# Patient Record
Sex: Female | Born: 1945 | Race: White | Hispanic: No | Marital: Married | Smoking: Never smoker
Health system: Southern US, Community
[De-identification: ages and names within clinical notes are randomized; demographics above are authoritative.]

## PROBLEM LIST (undated history)

## (undated) DIAGNOSIS — E7800 Pure hypercholesterolemia, unspecified: Secondary | ICD-10-CM

## (undated) DIAGNOSIS — I10 Essential (primary) hypertension: Secondary | ICD-10-CM

## (undated) DIAGNOSIS — R112 Nausea with vomiting, unspecified: Secondary | ICD-10-CM

## (undated) DIAGNOSIS — Z9889 Other specified postprocedural states: Secondary | ICD-10-CM

## (undated) DIAGNOSIS — M75101 Unspecified rotator cuff tear or rupture of right shoulder, not specified as traumatic: Secondary | ICD-10-CM

## (undated) DIAGNOSIS — R51 Headache: Secondary | ICD-10-CM

## (undated) DIAGNOSIS — K219 Gastro-esophageal reflux disease without esophagitis: Secondary | ICD-10-CM

## (undated) DIAGNOSIS — N189 Chronic kidney disease, unspecified: Secondary | ICD-10-CM

## (undated) HISTORY — PX: OTHER SURGICAL HISTORY: SHX169

## (undated) HISTORY — PX: BREAST SURGERY: SHX581

## (undated) HISTORY — PX: SHOULDER SURGERY: SHX246

## (undated) HISTORY — DX: Essential (primary) hypertension: I10

## (undated) HISTORY — PX: TUBAL LIGATION: SHX77

## (undated) HISTORY — PX: FRACTURE SURGERY: SHX138

## (undated) HISTORY — PX: ABDOMINAL HYSTERECTOMY: SHX81

## (undated) HISTORY — PX: BREAST EXCISIONAL BIOPSY: SUR124

## (undated) HISTORY — PX: BLEPHAROPLASTY: SUR158

---

## 1999-06-04 ENCOUNTER — Ambulatory Visit (HOSPITAL_COMMUNITY): Admission: RE | Admit: 1999-06-04 | Discharge: 1999-06-04 | Payer: Self-pay | Admitting: Cardiology

## 1999-08-27 ENCOUNTER — Encounter: Payer: Self-pay | Admitting: Gynecology

## 1999-08-27 ENCOUNTER — Encounter: Admission: RE | Admit: 1999-08-27 | Discharge: 1999-08-27 | Payer: Self-pay | Admitting: Gynecology

## 1999-11-01 ENCOUNTER — Ambulatory Visit (HOSPITAL_COMMUNITY): Admission: RE | Admit: 1999-11-01 | Discharge: 1999-11-01 | Payer: Self-pay | Admitting: *Deleted

## 1999-11-01 ENCOUNTER — Encounter: Payer: Self-pay | Admitting: *Deleted

## 2000-03-02 ENCOUNTER — Emergency Department (HOSPITAL_COMMUNITY): Admission: EM | Admit: 2000-03-02 | Discharge: 2000-03-02 | Payer: Self-pay | Admitting: Emergency Medicine

## 2001-08-07 ENCOUNTER — Other Ambulatory Visit: Admission: RE | Admit: 2001-08-07 | Discharge: 2001-08-07 | Payer: Self-pay | Admitting: Gynecology

## 2002-09-03 ENCOUNTER — Other Ambulatory Visit: Admission: RE | Admit: 2002-09-03 | Discharge: 2002-09-03 | Payer: Self-pay | Admitting: Gynecology

## 2003-09-11 ENCOUNTER — Other Ambulatory Visit: Admission: RE | Admit: 2003-09-11 | Discharge: 2003-09-11 | Payer: Self-pay | Admitting: Gynecology

## 2003-10-09 ENCOUNTER — Encounter: Admission: RE | Admit: 2003-10-09 | Discharge: 2003-10-09 | Payer: Self-pay | Admitting: Gynecology

## 2004-10-09 ENCOUNTER — Encounter: Admission: RE | Admit: 2004-10-09 | Discharge: 2004-10-09 | Payer: Self-pay | Admitting: Gynecology

## 2004-11-02 ENCOUNTER — Ambulatory Visit (HOSPITAL_COMMUNITY): Admission: RE | Admit: 2004-11-02 | Discharge: 2004-11-02 | Payer: Self-pay | Admitting: Gastroenterology

## 2004-11-11 ENCOUNTER — Other Ambulatory Visit: Admission: RE | Admit: 2004-11-11 | Discharge: 2004-11-11 | Payer: Self-pay | Admitting: Gynecology

## 2006-07-20 ENCOUNTER — Encounter: Admission: RE | Admit: 2006-07-20 | Discharge: 2006-07-20 | Payer: Self-pay | Admitting: Family Medicine

## 2006-07-20 IMAGING — US US EXTREM LOW VENOUS*L*
1 series · 14 of 22 positions shown · non-contrast
Comparison: None.

CLINICAL DATA: Left leg edema.  Question DVT. 
 LEFT LOWER EXTREMITY VENOUS DOPPLER ULTRASOUND:
TECHNIQUE: Gray-scale sonography with compression, as well as color and duplex Doppler ultrasound, were performed to evaluate the deep venous system from the level of the common femoral vein through the popliteal and proximal calf veins.

[Series 1: unknown · 14 of 22 slices shown]
[im 1/22]
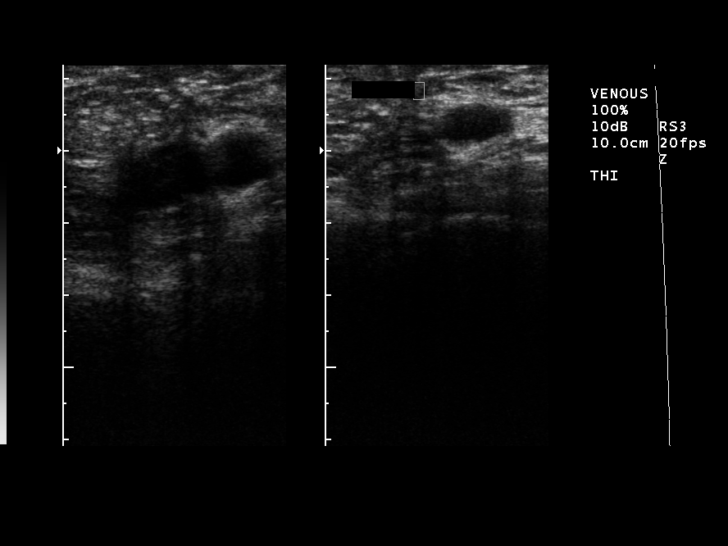
[im 3/22]
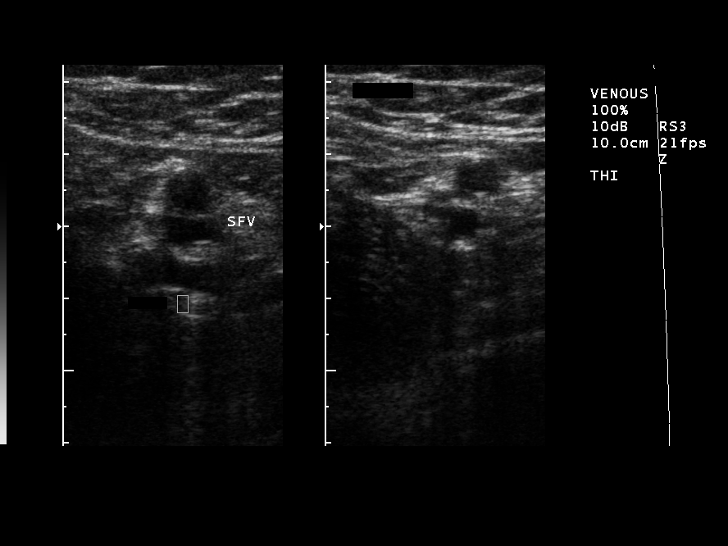
[im 4/22]
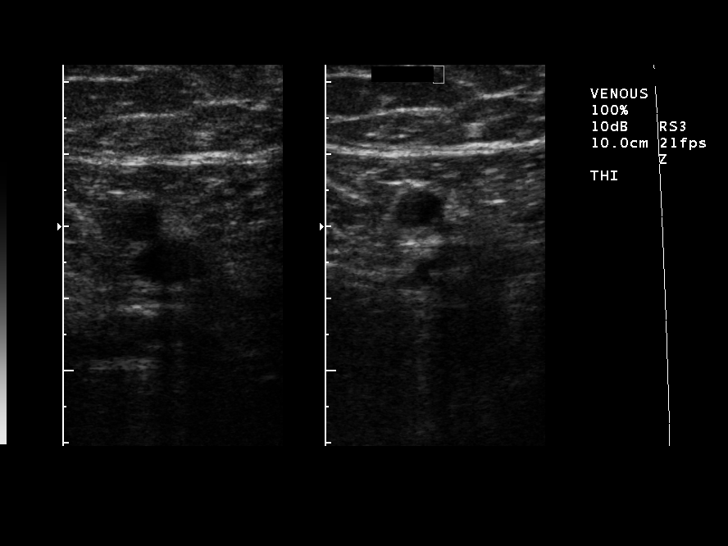
[im 6/22]
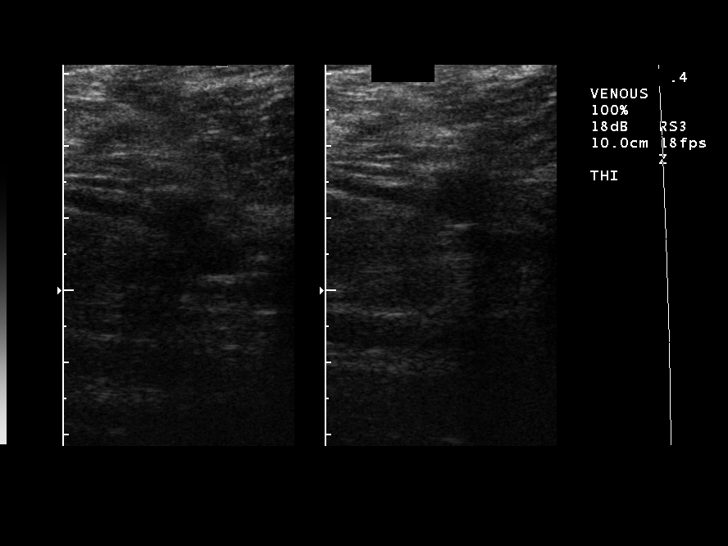
[im 8/22]
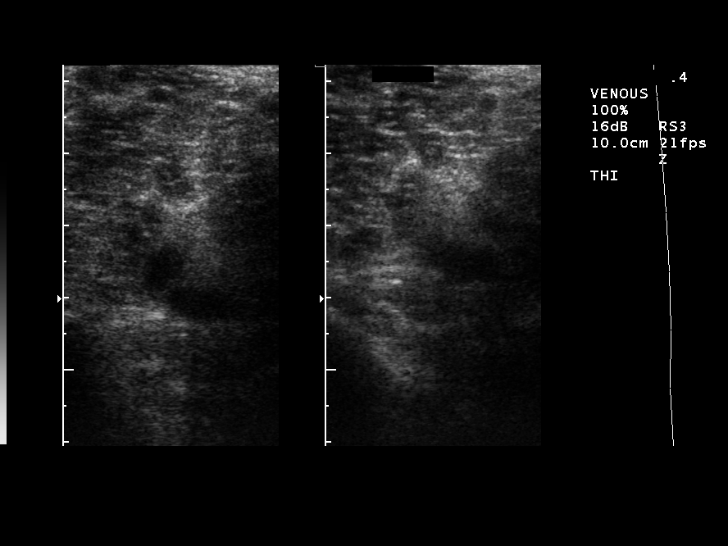
[im 9/22]
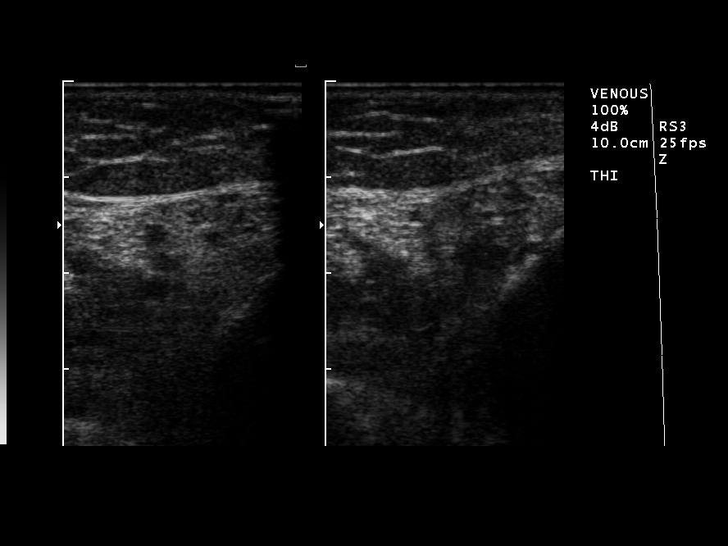
[im 11/22]
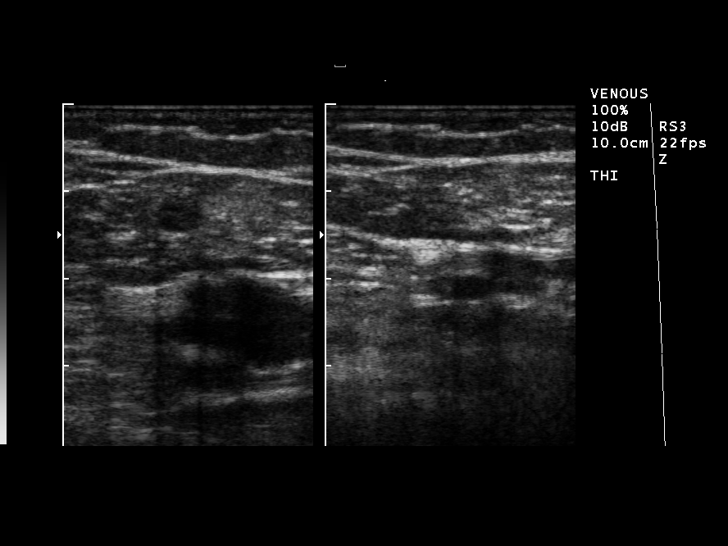
[im 12/22]
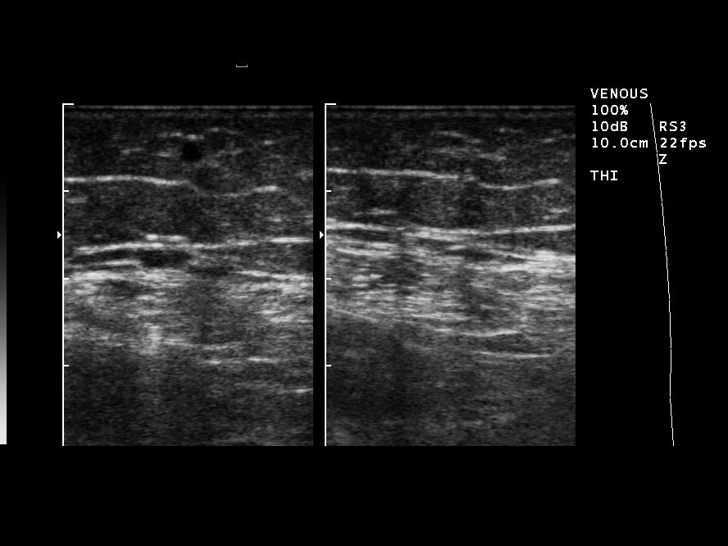
[im 14/22]
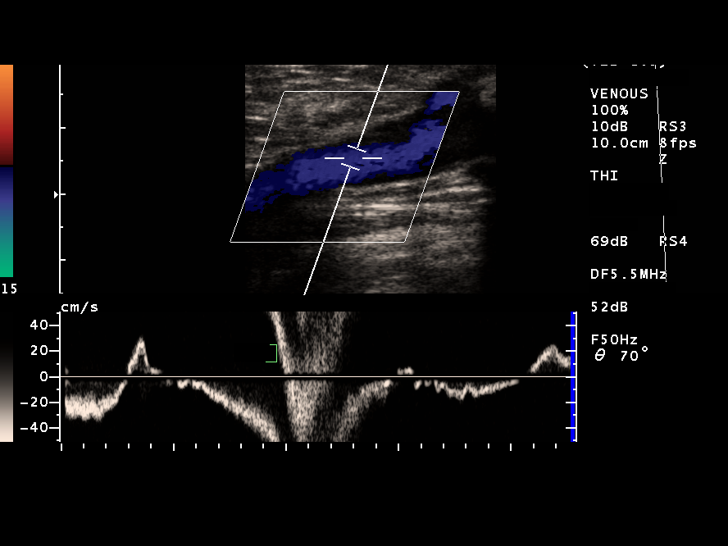
[im 15/22]
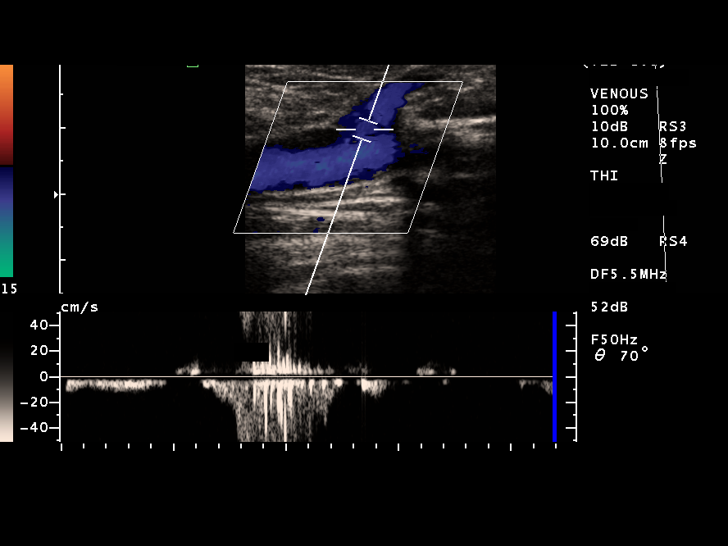
[im 17/22]
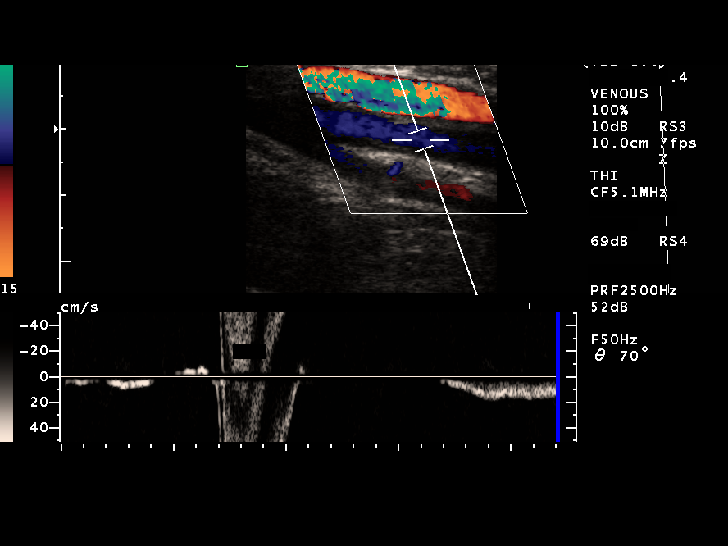
[im 19/22]
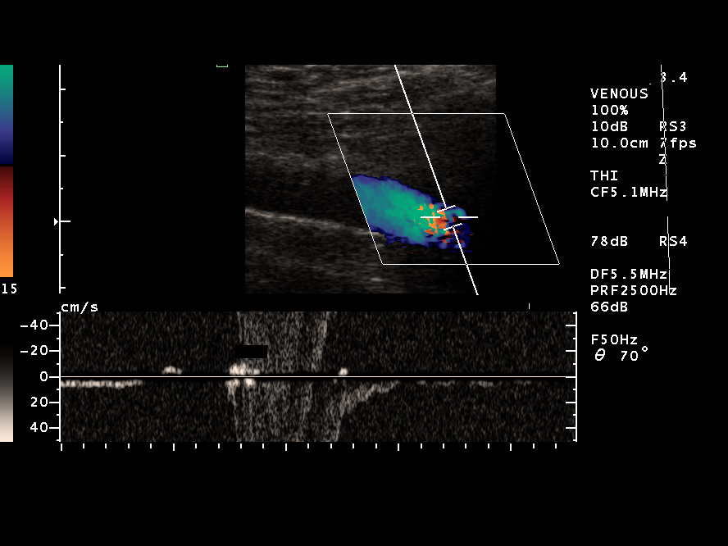
[im 20/22]
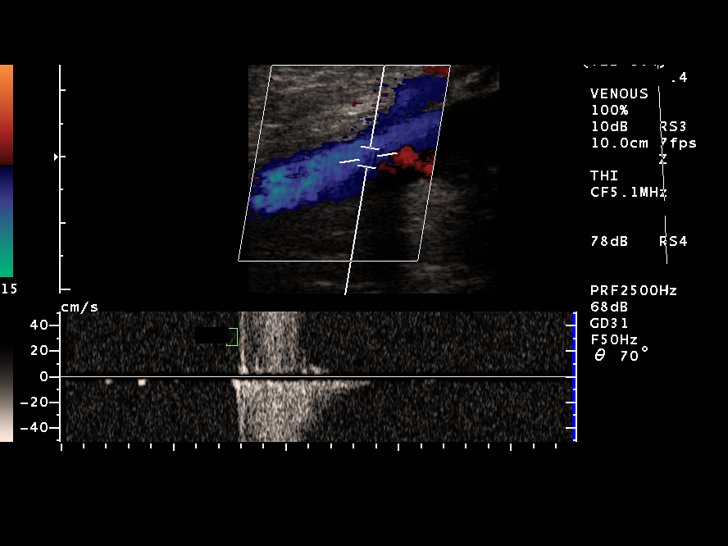
[im 22/22]
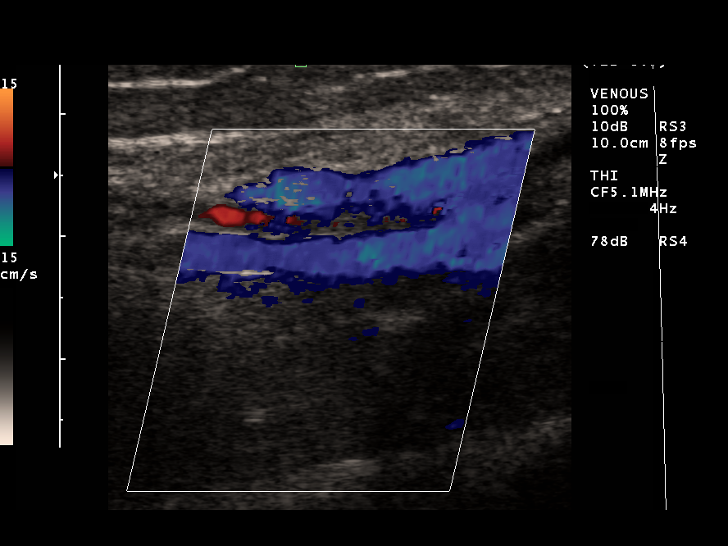

[14 of 22 positions shown; findings below may reference images not displayed]

FINDINGS: Complete compressibility is demonstrated throughout the visualized deep veins.  No venous filling defects are identified by gray-scale or color Doppler sonography.  Doppler waveforms show normal direction of venous flow, with normal phasicity and response to augmentation.
IMPRESSION: Negative.  No evidence of deep venous thrombosis.

## 2006-08-15 ENCOUNTER — Other Ambulatory Visit: Admission: RE | Admit: 2006-08-15 | Discharge: 2006-08-15 | Payer: Self-pay | Admitting: Gynecology

## 2006-08-19 ENCOUNTER — Ambulatory Visit (HOSPITAL_COMMUNITY): Admission: RE | Admit: 2006-08-19 | Discharge: 2006-08-19 | Payer: Self-pay | Admitting: Gynecology

## 2006-08-19 IMAGING — MG MM DIGITAL SCREENING BILAT
4 series · 4 of 4 positions shown · non-contrast
Comparison: none

DG SCREEN MAMMOGRAM BILATERAL
Bilateral CC and MLO view(s) were taken.
Prior study comparison: October 09, 2004, bilateral screening mammogram, performed at [facility] [REDACTED].

DIGITAL SCREENING MAMMOGRAM WITH CAD:
The breast tissue is extremely dense.  There is no dominant mass, architectural distortion or 
calcification to suggest malignancy.

[R CC]
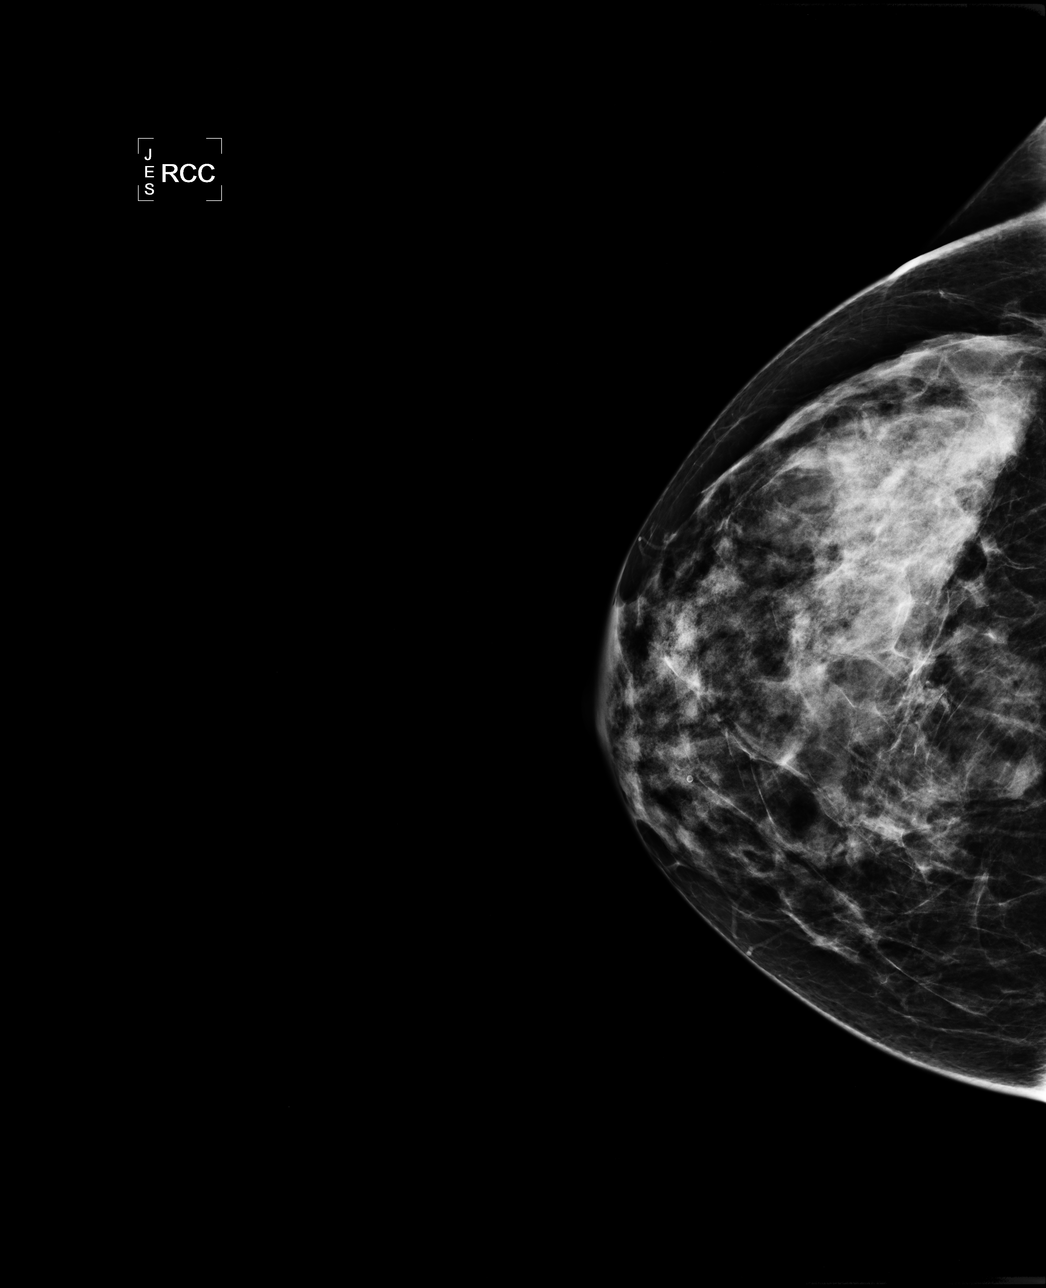

[R MLO]
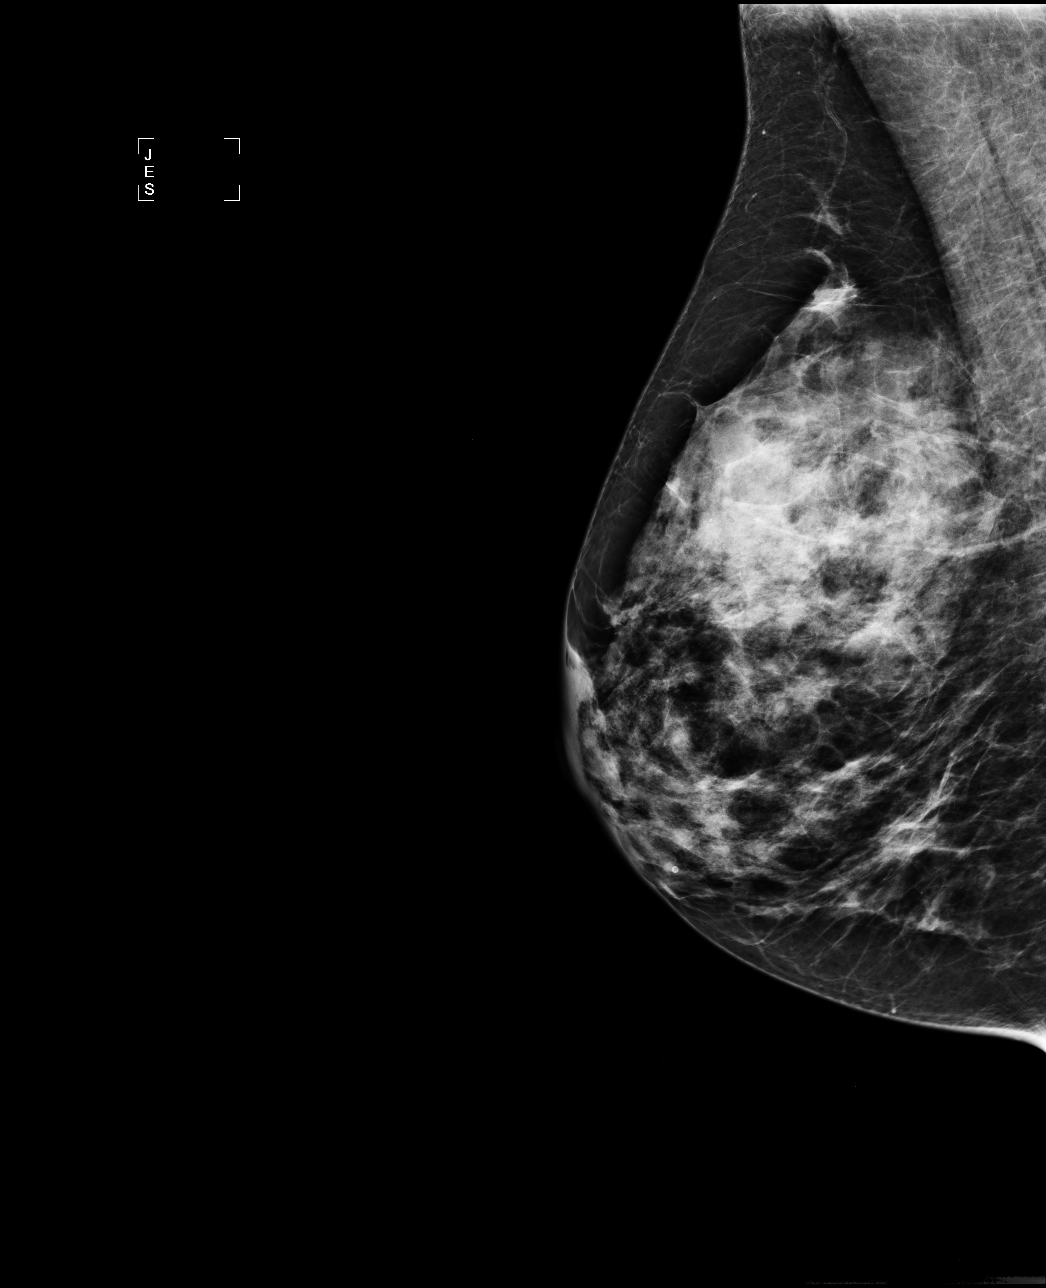

[L CC]
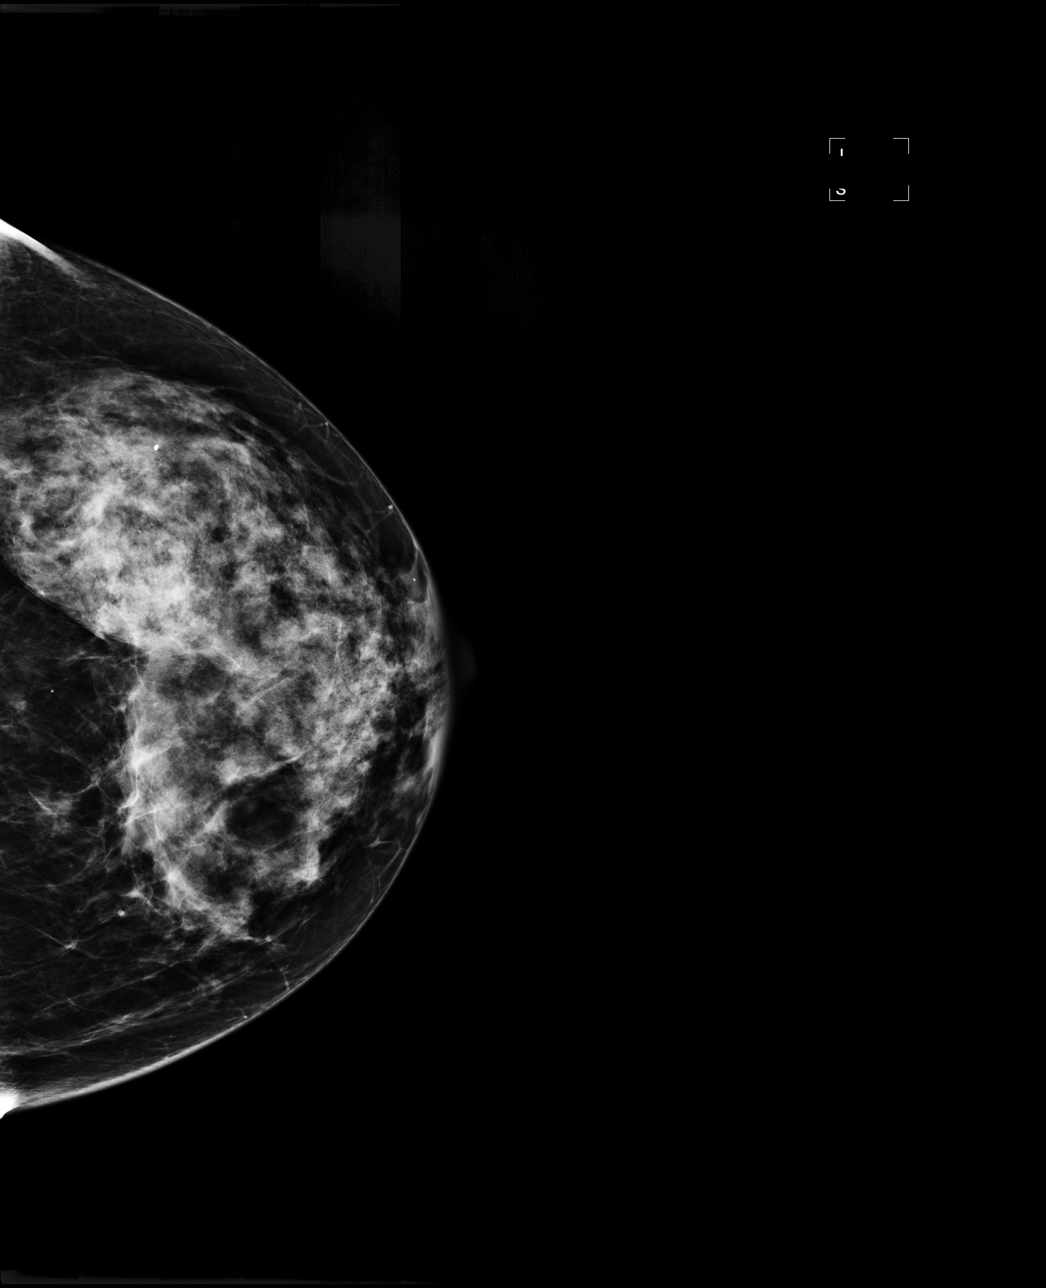

[L MLO]
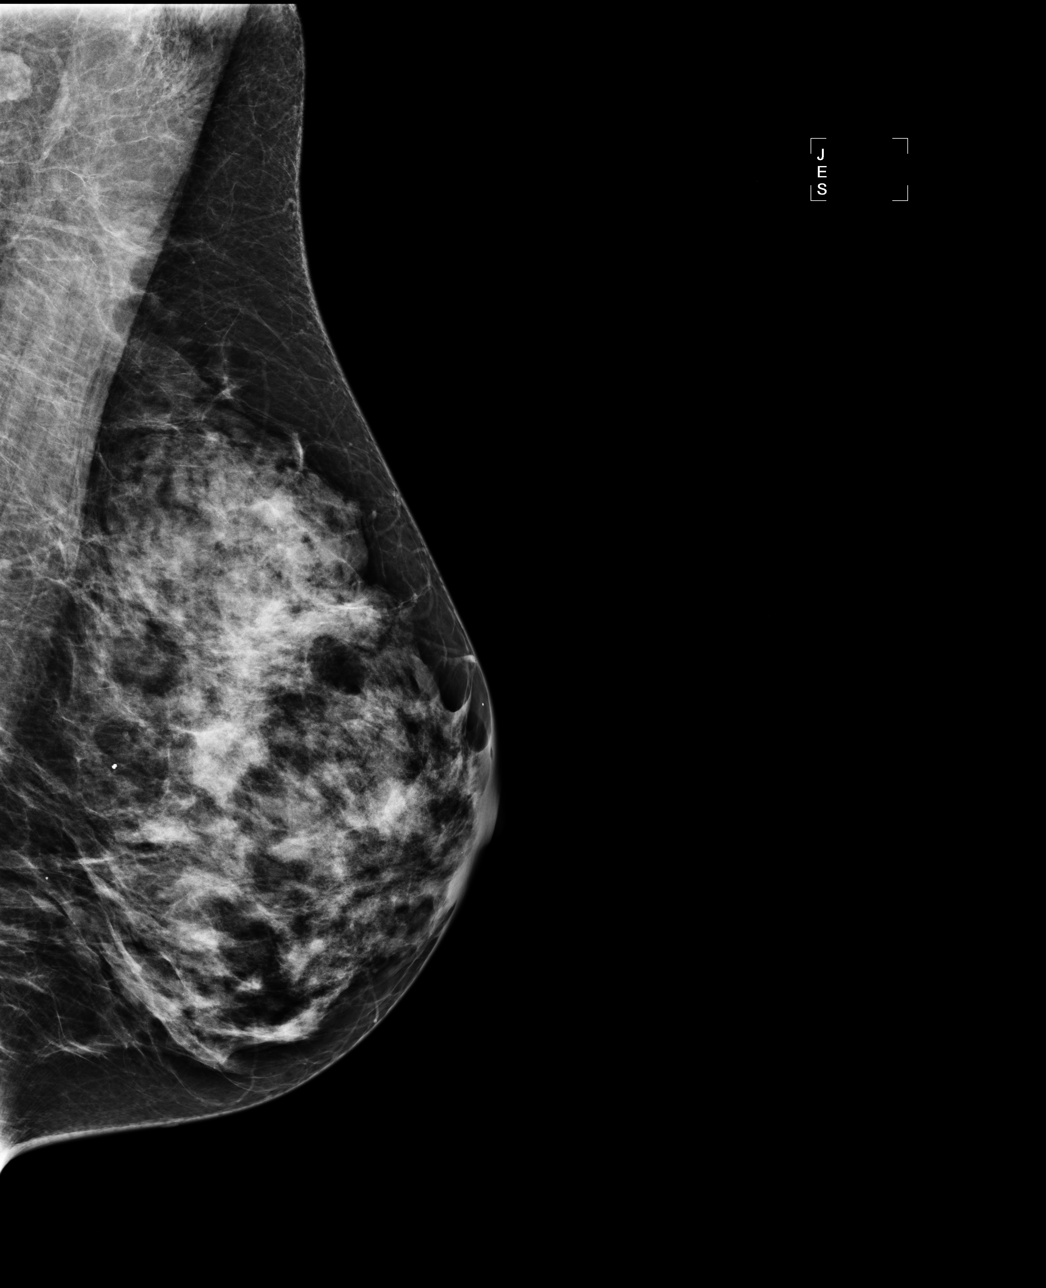

[4 of 4 positions shown; findings below may reference images not displayed]

IMPRESSION: No mammographic evidence of malignancy.  Suggest yearly screening mammography.

ASSESSMENT: Negative - BI-RADS 1

Screening mammogram in 1 year.
ANALYZED BY COMPUTER AIDED DETECTION. , THIS PROCEDURE WAS A DIGITAL MAMMOGRAM.

## 2007-08-07 ENCOUNTER — Emergency Department (HOSPITAL_COMMUNITY): Admission: EM | Admit: 2007-08-07 | Discharge: 2007-08-08 | Payer: Self-pay | Admitting: Emergency Medicine

## 2007-08-07 IMAGING — CR DG FOREARM 2V*L*
2 series · 2 of 2 positions shown · non-contrast
Comparison: none

CLINICAL DATA: Wrist pain, fall, broken arm.

LEFT WRIST - 4 VIEW

[x forearm lat left]
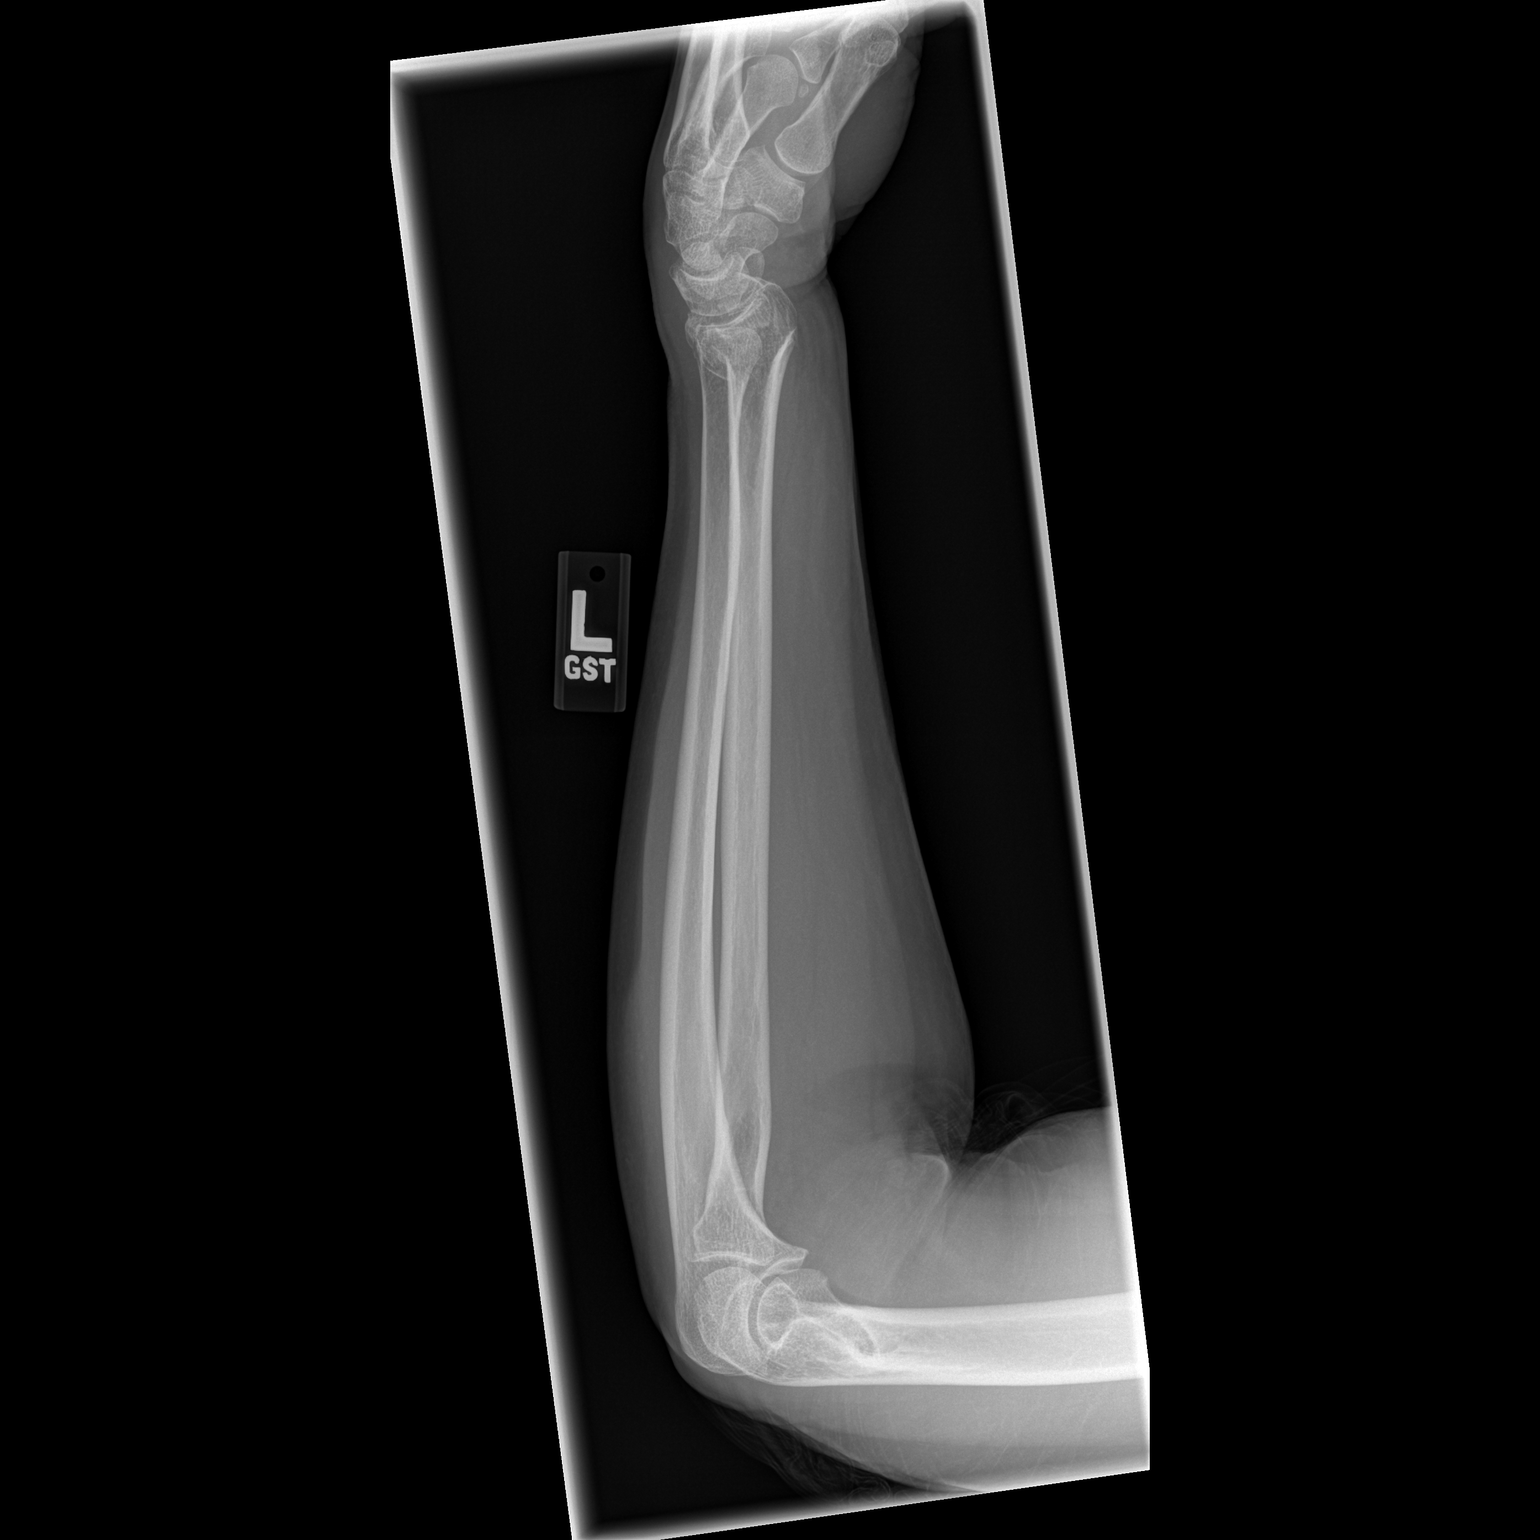

[x forearm ap left]
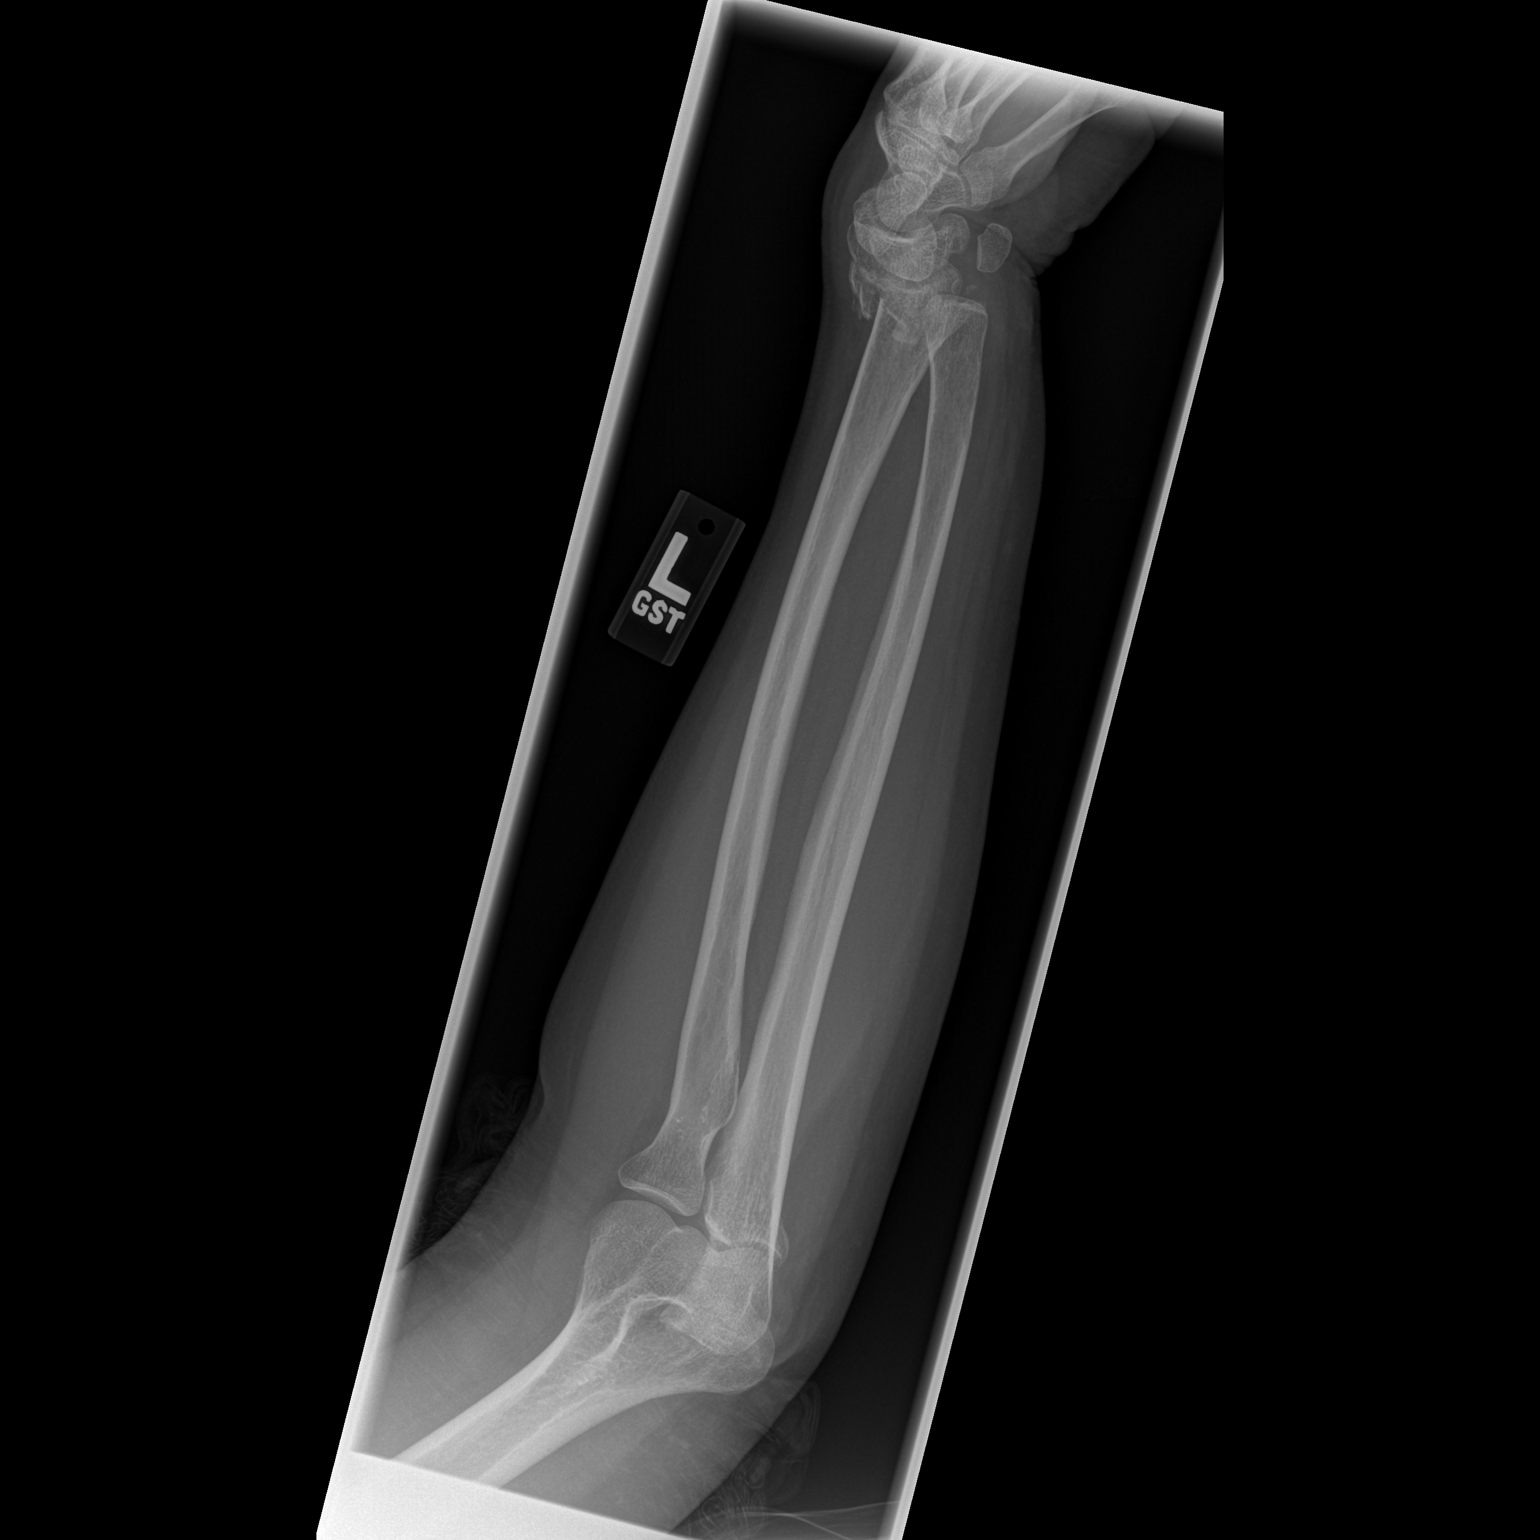

[2 of 2 positions shown; findings below may reference images not displayed]

FINDINGS: Comminuted, minimally impacted distal left radius fracture is
present, with intra-articular extension. Scaphoid appears intact. Ulnar styloid
fracture is present, minimally displaced. Slight apex volar angulation of the
distal radius. Loss of the normal volar tilt.
Mild widening of the distal radial ulnar joint.

IMPRESSION
Comminuted minimally displaced and angulated intra-articular distal left radius
fracture.
Ulnar styloid fracture.

LEFT FOREARM - 2 VIEW
FINDINGS: Proximal left radius and ulna are intact. Distal fractures described
above with wrist. Proximal soft tissues unremarkable.

IMPRESSION

Distal left radius and ulna fractures.

## 2007-09-12 ENCOUNTER — Ambulatory Visit (HOSPITAL_COMMUNITY): Admission: RE | Admit: 2007-09-12 | Discharge: 2007-09-12 | Payer: Self-pay | Admitting: Family Medicine

## 2008-01-15 ENCOUNTER — Ambulatory Visit: Payer: Self-pay | Admitting: Gastroenterology

## 2008-01-15 DIAGNOSIS — R1013 Epigastric pain: Secondary | ICD-10-CM | POA: Insufficient documentation

## 2008-01-16 ENCOUNTER — Ambulatory Visit: Payer: Self-pay | Admitting: Gastroenterology

## 2008-01-16 ENCOUNTER — Encounter: Payer: Self-pay | Admitting: Gastroenterology

## 2008-01-16 DIAGNOSIS — K2970 Gastritis, unspecified, without bleeding: Secondary | ICD-10-CM | POA: Insufficient documentation

## 2008-01-16 DIAGNOSIS — K2990 Gastroduodenitis, unspecified, without bleeding: Secondary | ICD-10-CM

## 2008-01-16 DIAGNOSIS — K219 Gastro-esophageal reflux disease without esophagitis: Secondary | ICD-10-CM | POA: Insufficient documentation

## 2008-02-27 ENCOUNTER — Encounter: Payer: Self-pay | Admitting: Gastroenterology

## 2008-02-27 DIAGNOSIS — M129 Arthropathy, unspecified: Secondary | ICD-10-CM | POA: Insufficient documentation

## 2008-02-28 ENCOUNTER — Ambulatory Visit: Payer: Self-pay | Admitting: Gastroenterology

## 2008-09-12 ENCOUNTER — Ambulatory Visit (HOSPITAL_COMMUNITY): Admission: RE | Admit: 2008-09-12 | Discharge: 2008-09-12 | Payer: Self-pay | Admitting: Gynecology

## 2008-12-17 ENCOUNTER — Emergency Department (HOSPITAL_COMMUNITY): Admission: EM | Admit: 2008-12-17 | Discharge: 2008-12-17 | Payer: Self-pay | Admitting: Emergency Medicine

## 2008-12-17 IMAGING — CR DG CHEST 1V PORT
1 series · 1 of 1 positions shown · non-contrast
Comparison: None

CLINICAL DATA: Chest pain and shortness of breath

PORTABLE CHEST - 1 VIEW

[view not recorded]
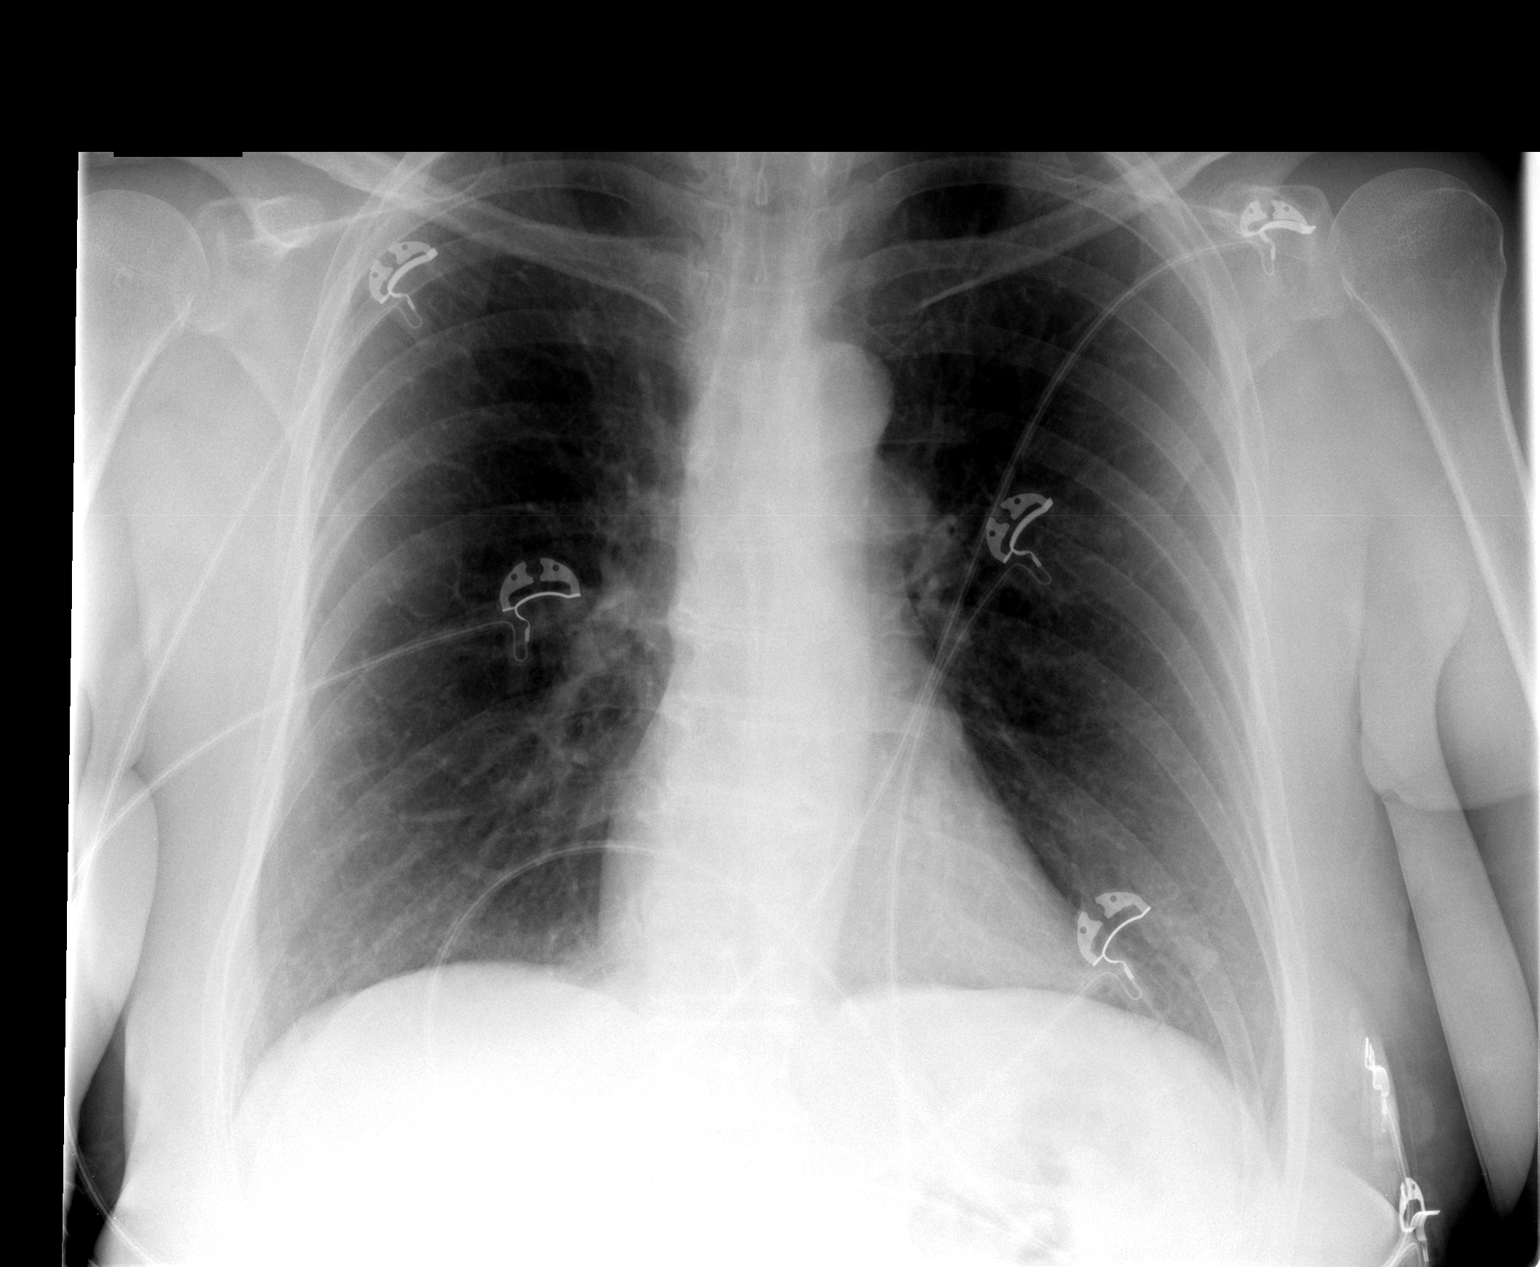

[1 of 1 positions shown; findings below may reference images not displayed]

FINDINGS: The cardiac silhouette, mediastinum, pulmonary
vasculature are within normal limits.  Both lungs are clear.
IMPRESSION: Normal frontal chest x-ray.

## 2009-09-15 ENCOUNTER — Ambulatory Visit (HOSPITAL_COMMUNITY): Admission: RE | Admit: 2009-09-15 | Discharge: 2009-09-15 | Payer: Self-pay | Admitting: Gynecology

## 2010-09-29 ENCOUNTER — Ambulatory Visit (HOSPITAL_COMMUNITY)
Admission: RE | Admit: 2010-09-29 | Discharge: 2010-09-29 | Payer: Self-pay | Source: Home / Self Care | Attending: Gynecology | Admitting: Gynecology

## 2010-09-29 IMAGING — MG MM DIGITAL SCREENING
4 series · 4 of 4 positions shown · non-contrast
Comparison: Prior studies.

DG SCREEN MAMMOGRAM BILATERAL
Bilateral CC and MLO view(s) were taken.
Prior study comparison: September 12, 2007, DG screen mammogram bilateral.

DIGITAL SCREENING MAMMOGRAM WITH CAD:

[R CC]
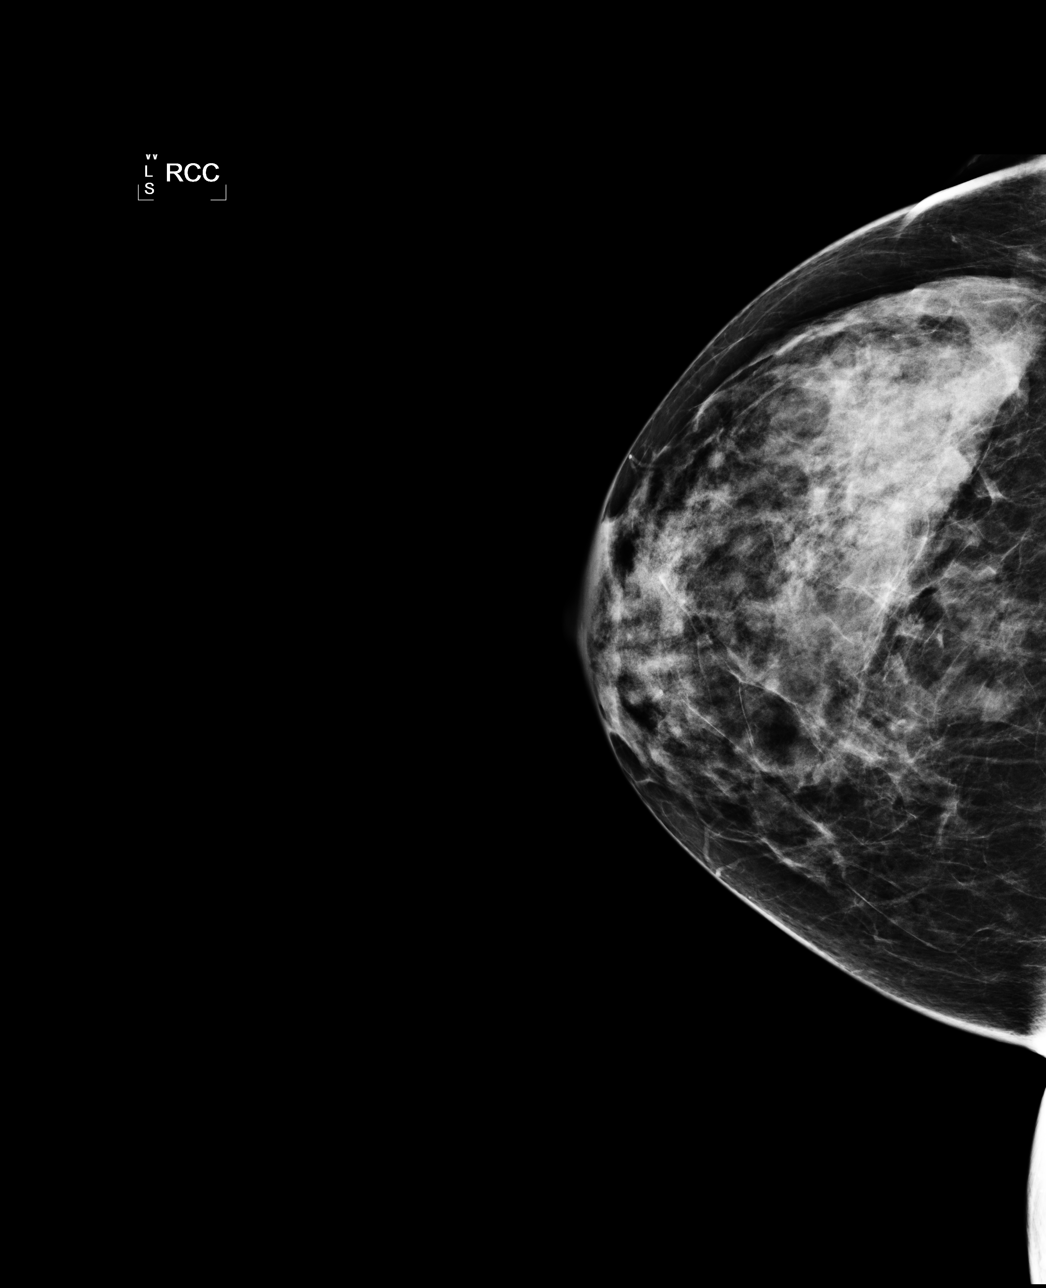

[R MLO]
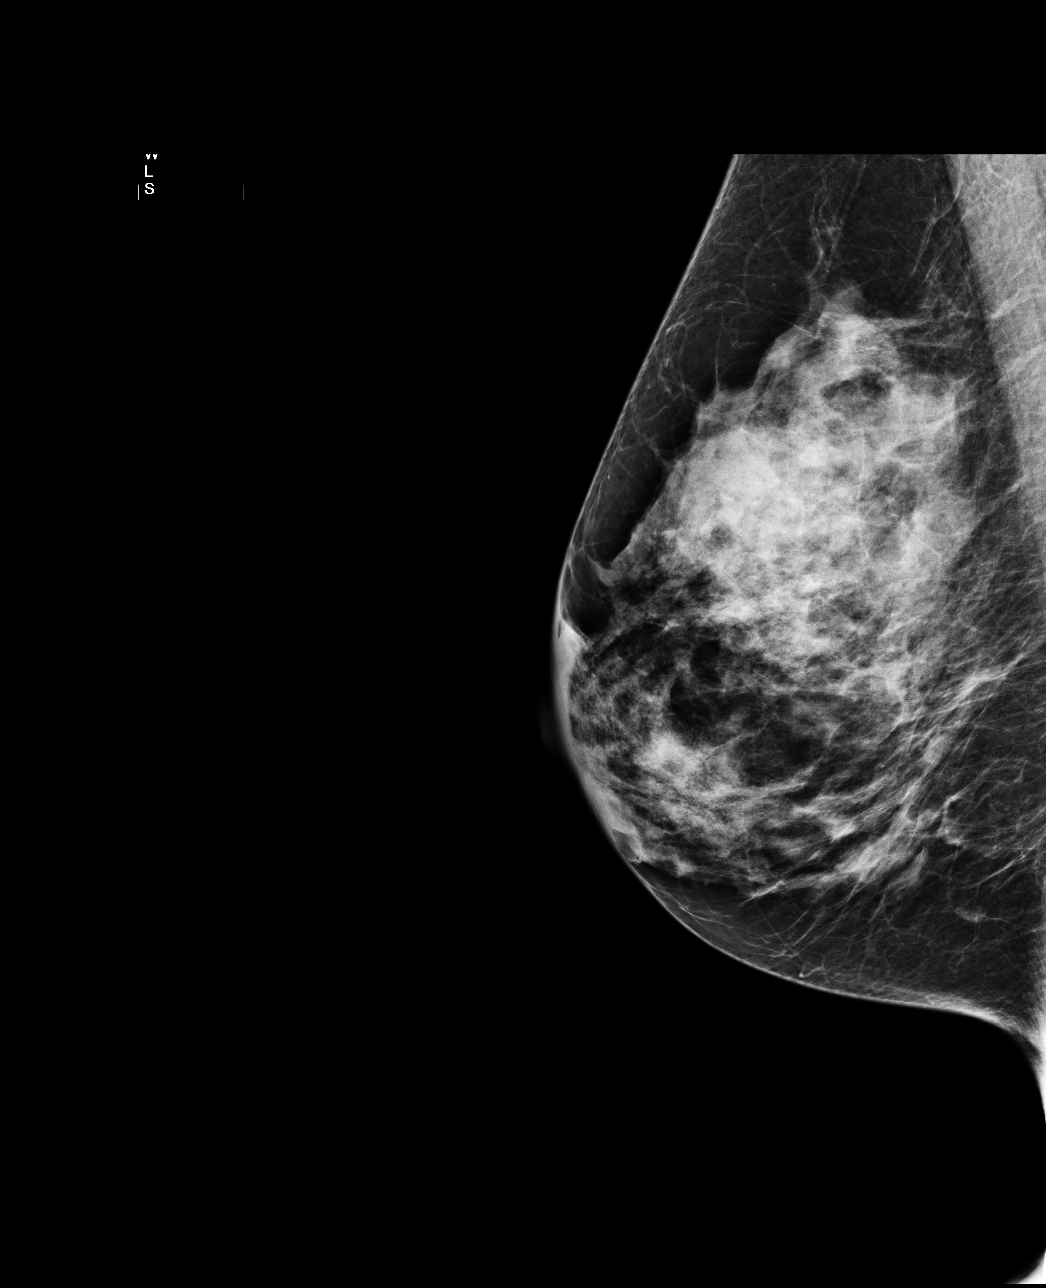

[L CC]
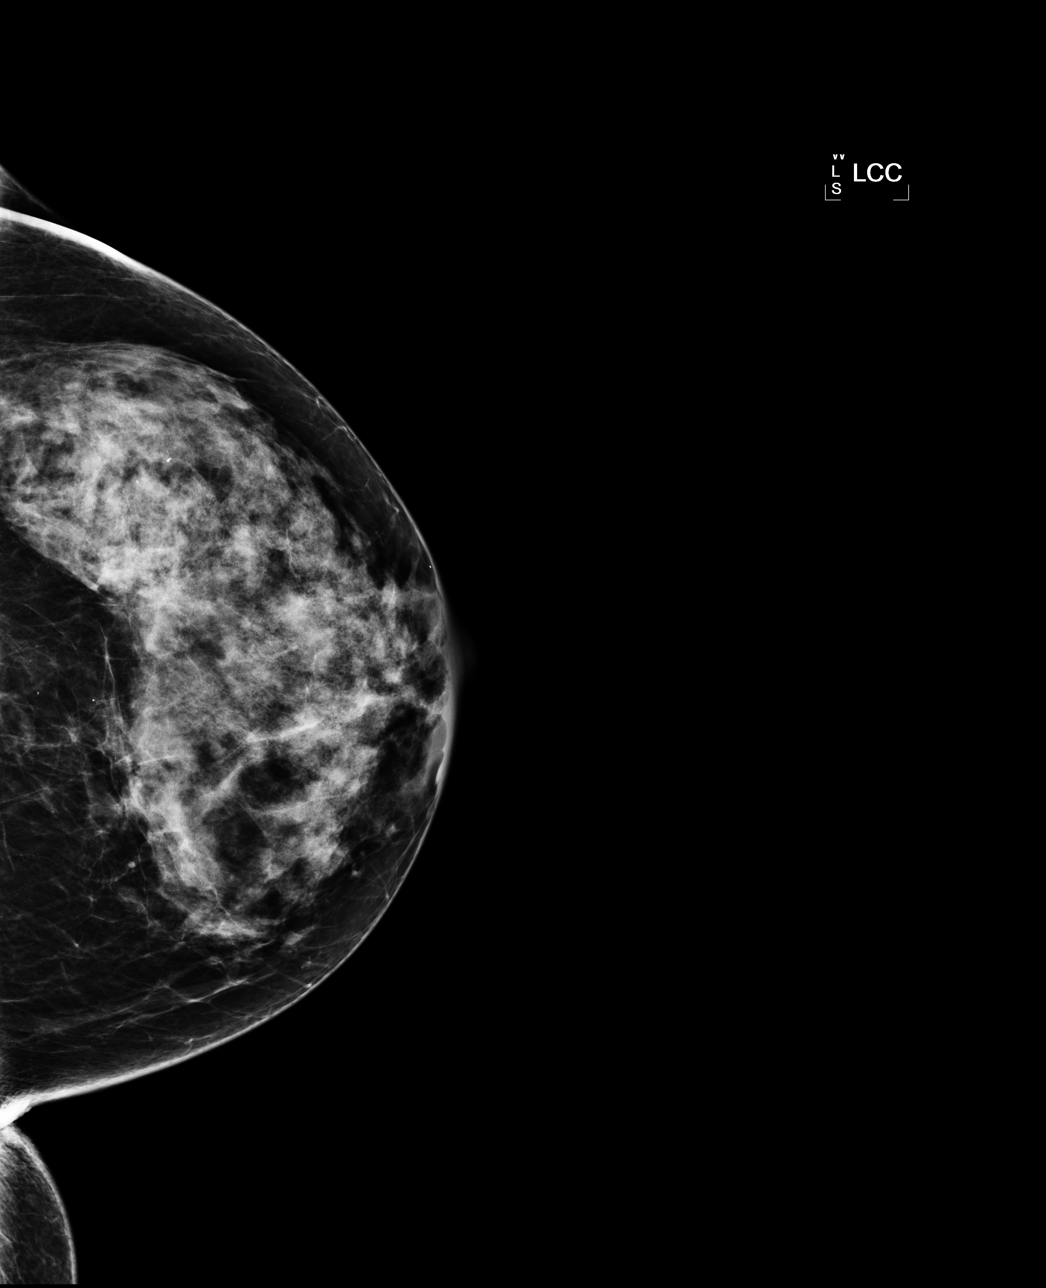

[L MLO]
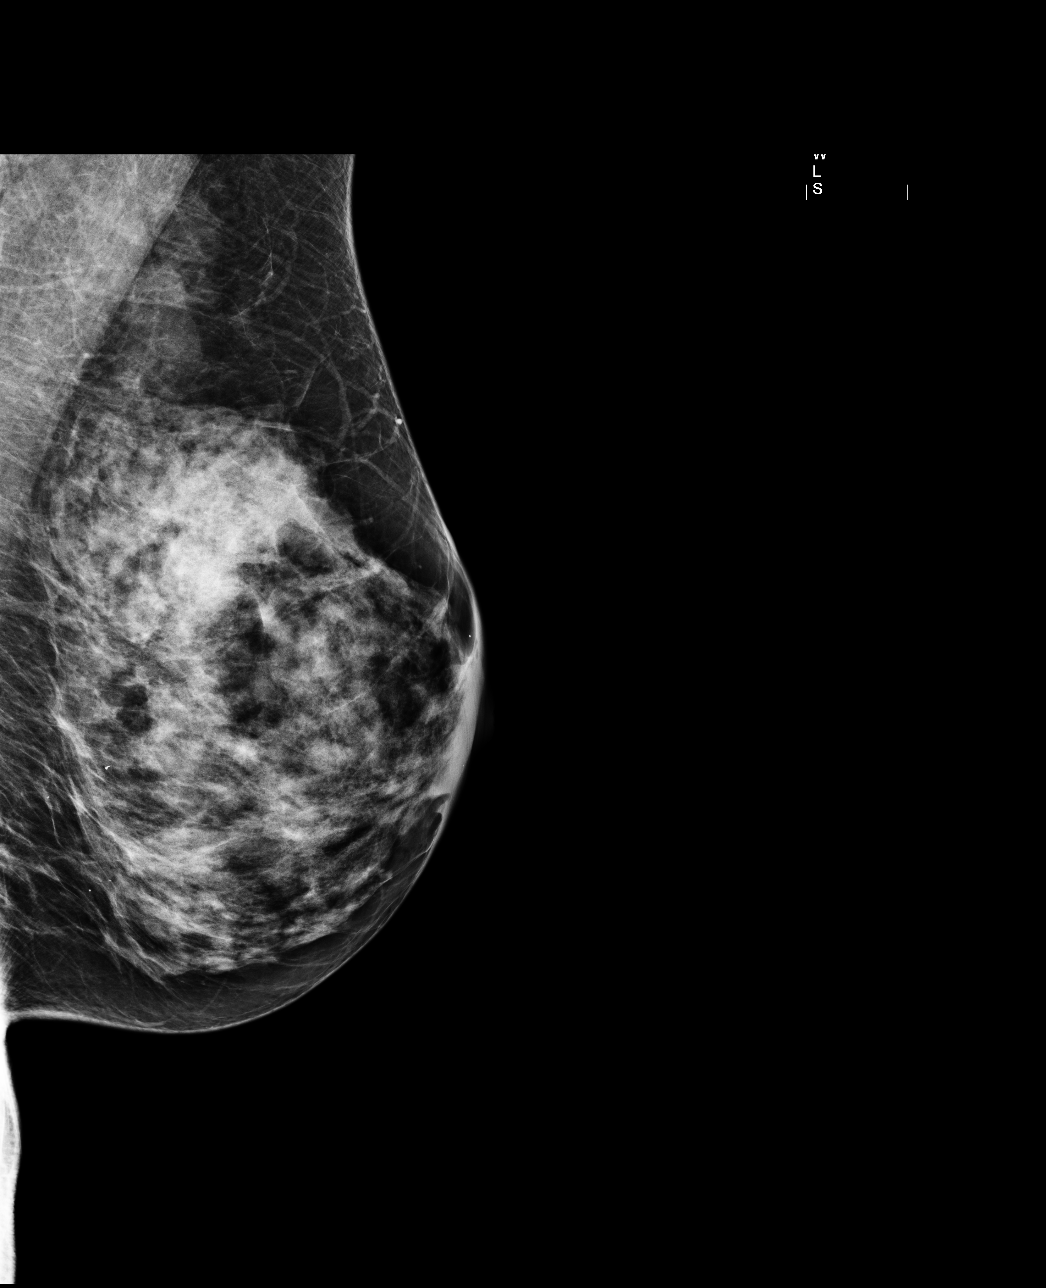

[4 of 4 positions shown; findings below may reference images not displayed]

The breast tissue is heterogeneously dense.  There is no dominant mass, architectural distortion or
calcification to suggest malignancy.

Images were processed with CAD.
IMPRESSION: No mammographic evidence of malignancy.  Suggest yearly screening mammography.

A result letter of this screening mammogram will be mailed directly to the patient.

ASSESSMENT: Negative - BI-RADS 1

Screening mammogram in 1 year.
,

## 2010-10-31 ENCOUNTER — Encounter: Payer: Self-pay | Admitting: Family Medicine

## 2010-11-01 ENCOUNTER — Encounter: Payer: Self-pay | Admitting: Gynecology

## 2011-01-21 LAB — POCT I-STAT, CHEM 8
BUN: 17 mg/dL (ref 6–23)
Calcium, Ion: 1.03 mmol/L — ABNORMAL LOW (ref 1.12–1.32)
HCT: 45 % (ref 36.0–46.0)
Hemoglobin: 15.3 g/dL — ABNORMAL HIGH (ref 12.0–15.0)
Sodium: 140 mEq/L (ref 135–145)
TCO2: 25 mmol/L (ref 0–100)

## 2011-01-21 LAB — POCT CARDIAC MARKERS: Myoglobin, poc: 105 ng/mL (ref 12–200)

## 2011-02-23 NOTE — Letter (Signed)
January 15, 2008    Evelena Peat, M.D.  [address]  [place], [place] [address]   RE:  Jeanne Schwartz, Jeanne Schwartz  MRN:  [id number]  /  DOB:  [birth year]   Dear Dr. Caryl Never:   Upon your kind referral, I had the pleasure of evaluating your patient  and I am pleased to offer my findings.  I saw Chavonne Sforza. [place] in the  office today.  Enclosed is a copy of my progress note that details my  findings and recommendations.   Thank you for the opportunity to participate in your patient's care.    Sincerely,      Barbette Hair. Arlyce Dice, MD,FACG  Electronically Signed    RDK/MedQ  DD: 01/15/2008  DT: 01/15/2008  Job #: [id number]

## 2011-02-23 NOTE — Assessment & Plan Note (Signed)
 HEALTHCARE                         GASTROENTEROLOGY OFFICE NOTE   SHAKIAH, WESTER                      MRN:          [id number]  DATE:01/15/2008                            DOB:          1946/06/23    REASON FOR CONSULTATION:  Abdominal pain.   Jeanne Schwartz is a pleasant 65 year old white female, referred through the  courtesy of Dr. Caryl Never for evaluation.  Over the past 1-2 months,  Mrs. Allerton has been complaining of moderately severe upper mid  epigastric pain.  The pain occurs on an empty stomach and may be  slightly worsened post prandial.  She has some radiation to her back.  She took Prilosec for several days with some improvement, although  symptoms remained.  She denies dysphagia or nausea, sore throat.  She  has no prior recent GI complaints.  She is on no gastric irritants  including nonsteroidals or alcohol.  She underwent a colonoscopy  approximately 4 years ago with Dr. Loreta Ave that apparently was  unremarkable.  She underwent upper endoscopy 8 years ago by me, though  results are not known.   PAST MEDICAL HISTORY:  1. Arthritis.  2. Status post hysterectomy.  3. Status post appendectomy.   FAMILY HISTORY:  Mother with ulcerative colitis.   MEDICATIONS:  Lipitor, estrogen, Fosamax.   ALLERGIES:  1. COMPAZINE.  2. SULFA.  3. PENICILLIN.   She neither smokes nor drinks.  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   REVIEW OF SYSTEMS:  Positive for frequent cough, sleeping problems, and  night sweats, and some dyspnea on exertion.   PHYSICAL EXAMINATION:  VITAL SIGNS:  Pulse 72, blood pressure 124/74,  weight 152.  HEENT: EOMI.  PERRLA.  Sclerae are anicteric.  Conjunctivae are pink.  NECK:  Supple without thyromegaly, adenopathy or carotid bruits.  CHEST:  Clear to auscultation and percussion without adventitious  sounds.  CARDIAC:  Regular rhythm; normal S1 S2.  There are no murmurs, gallops  or rubs.  ABDOMEN:  Bowel sounds are normoactive.  Abdomen is soft, nontender and  nondistended.  There are no abdominal masses, tenderness, splenic  enlargement or hepatomegaly.  EXTREMITIES:  Full range of motion.  No cyanosis, clubbing or edema.  RECTAL:  Deferred.   IMPRESSION:  Persistent epigastric pain.  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  Gastroesophageal reflux disease may also be  operative.   RECOMMENDATIONS:  1. Begin Nexium 40 mg a day.  2. Upper endoscopy.     Barbette Hair. Arlyce Dice, MD,FACG  Electronically Signed    RDK/MedQ  DD: 01/15/2008  DT: 01/15/2008  Job #: [id number]   cc:   Evelena Peat, M.D.

## 2011-02-26 NOTE — Op Note (Signed)
NAMESHAMYA, Jeanne Schwartz               ACCOUNT NO.:  [id number]   MEDICAL RECORD NO.:  [id number]          PATIENT TYPE:  AMB   LOCATION:  ENDO                         FACILITY:  [facility]   PHYSICIAN:  Anselmo Rod, M.D.  DATE OF BIRTH:  May 06, 1946   DATE OF PROCEDURE:  11/02/2004  DATE OF DISCHARGE:                                 OPERATIVE REPORT   PROCEDURE PERFORMED:  Screening colonoscopy.   ENDOSCOPIST:  Anselmo Rod, M.D.   INSTRUMENT USED:  Olympus video colonoscope.   INDICATION FOR PROCEDURE:  A 65 year old white female undergoing screening  colonoscopy to rule out colonic polyps, masses, etc.   PREPROCEDURE PREPARATION:  Informed consent was procured from the patient.  The patient fasted for eight hours prior to the procedure and prepped with a  bottle of magnesium citrate and a gallon of GoLYTELY the night prior to the  procedure.  The risks and benefits of the procedure, including a 10% missed  rate of cancer and polyps, were discussed with the patient as well.   PREPROCEDURE PHYSICAL EXAMINATION:  VITAL SIGNS:  Stable.  NECK:  Supple.  CHEST:  Clear to auscultation.  HEART:  S1 and S2 regular.  ABDOMEN:  Soft with normal bowel sounds.   DESCRIPTION OF PROCEDURE:  The patient was placed in the left lateral  decubitus position.  Sedated with 100 mg of Demerol and 10 mg of Versed in  slow incremental doses. Once the patient was adequately sedated and  maintained on low-flow oxygen and continuous cardiac monitoring, the Olympus  video colonoscope was advanced from the rectum to the cecum.  The  appendiceal orifice and ileocecal valve were visualized and photographed.  The patient's position was changed from the left lateral decubitus to the  supine position.  With gentle application of abdominal pressure, we reached  the cecal base.  No masses, polyps, erosions, ulcerations, etc., were seen.  There were a few scattered sigmoid diverticula present.  Retroflexion in  the  right colon revealed no abnormalities.  The patient tolerated the procedure  well without immediate complications.   IMPRESSION:  1.  Essentially normal colonoscopy up to the cecum, except for a few      scattered diverticula.  2.  No masses or polyps seen.   RECOMMENDATIONS:  1.  Continue on a high-fiber diet with liberal fluid intake.  2.  Repeat colonoscopy in the next five years unless the patient develops      any abnormal symptoms in the interim.  3.  Outpatient followup on a p.r.n. basis.      JNM/MEDQ  D:  11/02/2004  T:  11/02/2004  Job:  [id number]   cc:   Teena Irani. Arlyce Dice, M.D.  [address]  [place]  [place] [address]  Fax: [phone]

## 2011-04-05 ENCOUNTER — Other Ambulatory Visit: Payer: Self-pay | Admitting: Gynecology

## 2011-07-27 ENCOUNTER — Other Ambulatory Visit: Payer: Self-pay | Admitting: Orthopedic Surgery

## 2011-07-27 DIAGNOSIS — M5020 Other cervical disc displacement, unspecified cervical region: Secondary | ICD-10-CM

## 2011-07-31 ENCOUNTER — Ambulatory Visit
Admission: RE | Admit: 2011-07-31 | Discharge: 2011-07-31 | Disposition: A | Discharge status: Home / Self Care | Payer: BC Managed Care – PPO | Source: Ambulatory Visit | Attending: Orthopedic Surgery | Admitting: Orthopedic Surgery

## 2011-07-31 DIAGNOSIS — M5020 Other cervical disc displacement, unspecified cervical region: Secondary | ICD-10-CM

## 2011-07-31 IMAGING — MR MR CERVICAL SPINE W/O CM
5 series · 31 of 48 positions shown · non-contrast
Comparison: None.

CLINICAL DATA: Pain in neck, head and right shoulder with the
weakness right arm for 15 years getting worse.

MRI CERVICAL SPINE WITHOUT CONTRAST
TECHNIQUE: Multiplanar and multiecho pulse sequences of the
cervical spine, to include the craniocervical junction and
cervicothoracic junction, were obtained according to standard
protocol without intravenous contrast.

[Series 3: T2 · sagittal · 3.0mm · 0.62mm/px · 6 of 12 slices shown (1 of 2)]
[im 1/12]
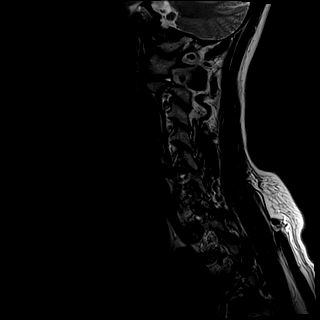
[im 3/12]
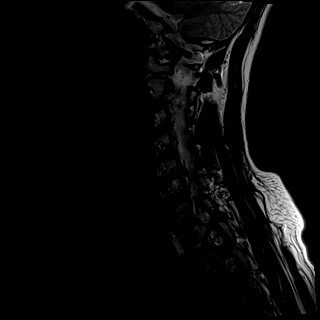
[im 5/12]
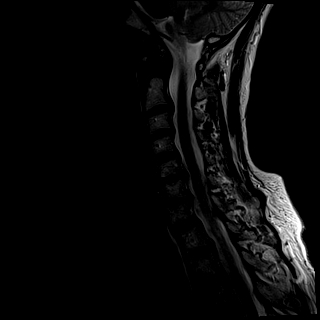
[im 7/12]
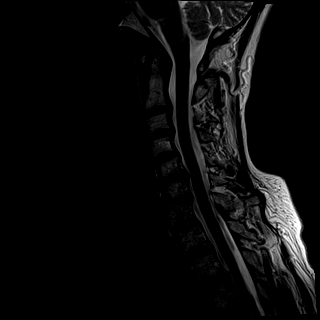
[im 9/12]
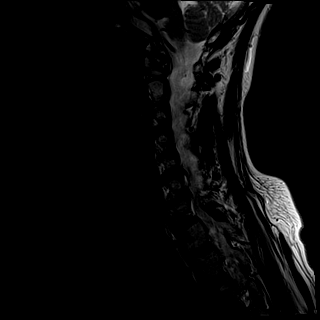
[im 12/12]
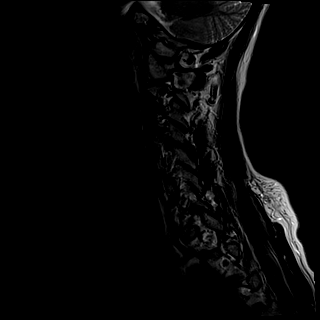

[Series 4: T1 · sagittal · 3.0mm · 0.66mm/px · 7 of 12 slices shown]
[im 1/12]
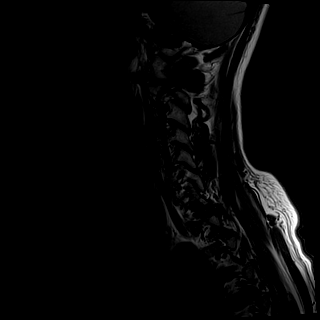
[im 2/12]
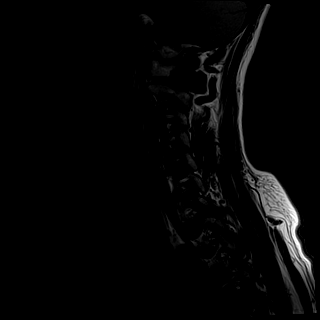
[im 4/12]
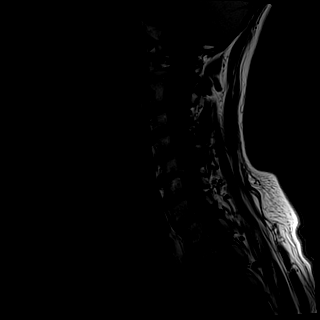
[im 6/12]
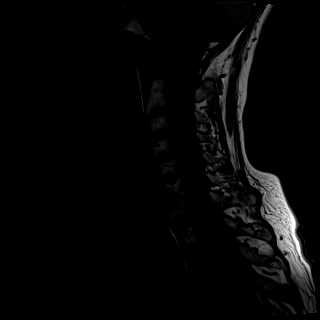
[im 8/12]
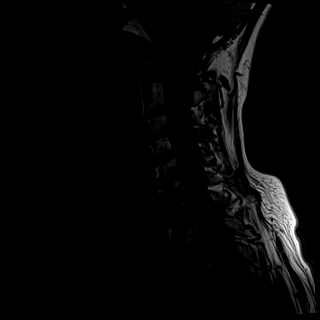
[im 10/12]
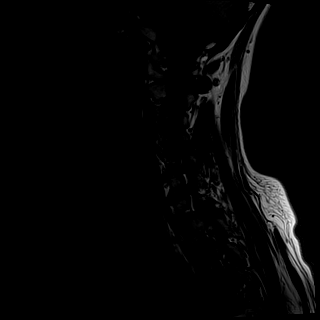
[im 12/12]
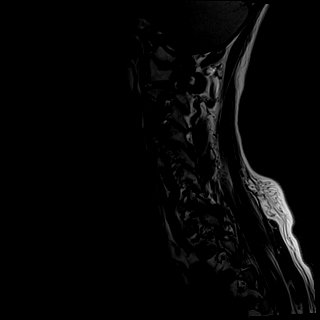

[Series 5: T2 · axial · 3.0mm · 0.56mm/px · z∈[-57,+36]mm · 8 of 26 slices shown (2 of 2)]
[im 1/26]
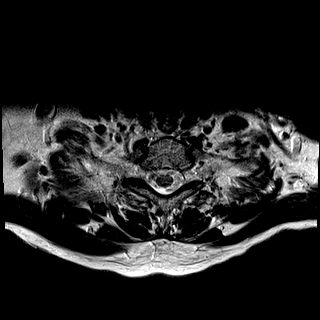
[im 4/26]
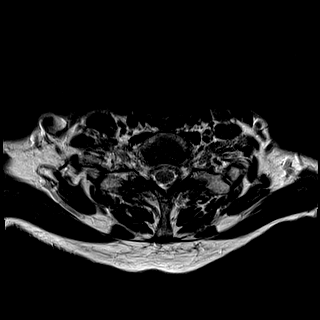
[im 8/26]
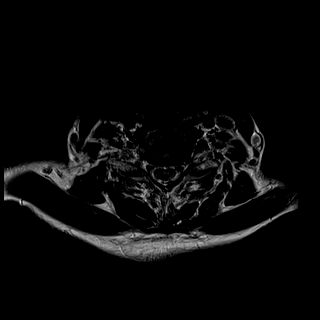
[im 12/26]
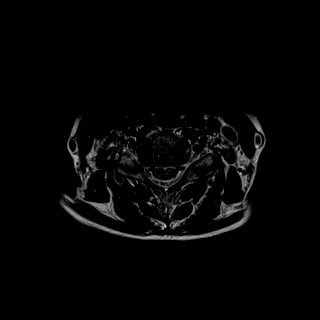
[im 14/26]
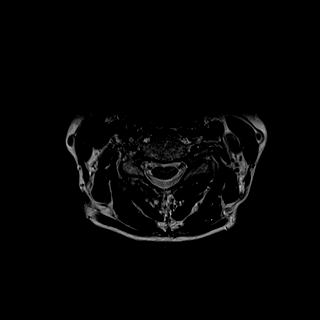
[im 18/26]
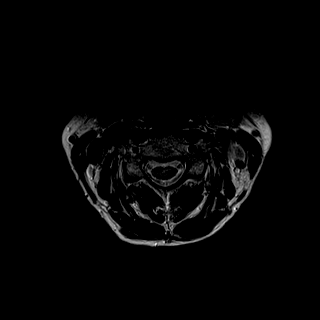
[im 22/26]
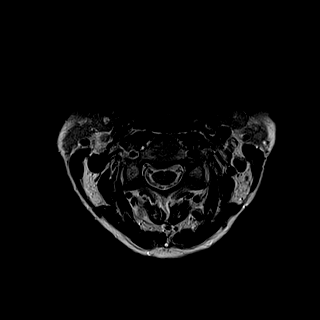
[im 26/26]
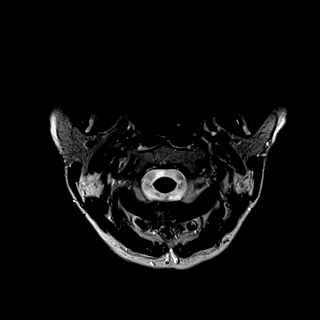

[Series 6: tir sag · sagittal · 3.0mm · 0.41mm/px · 7 of 12 slices shown]
[im 1/12]
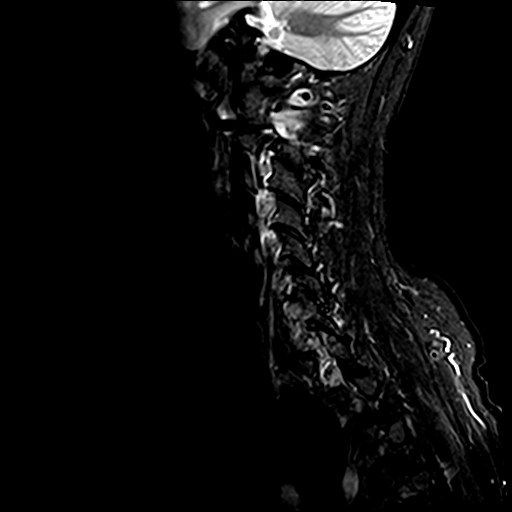
[im 2/12]
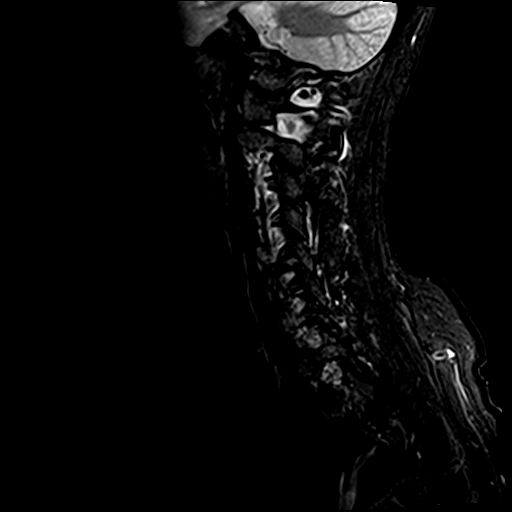
[im 4/12]
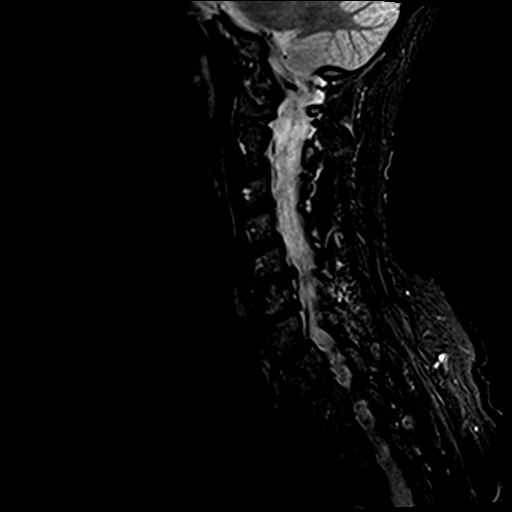
[im 6/12]
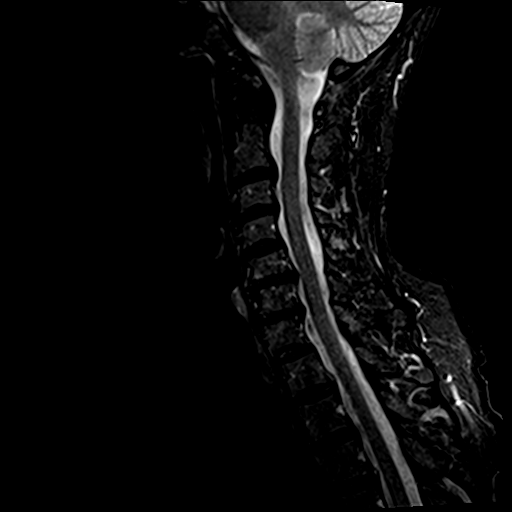
[im 8/12]
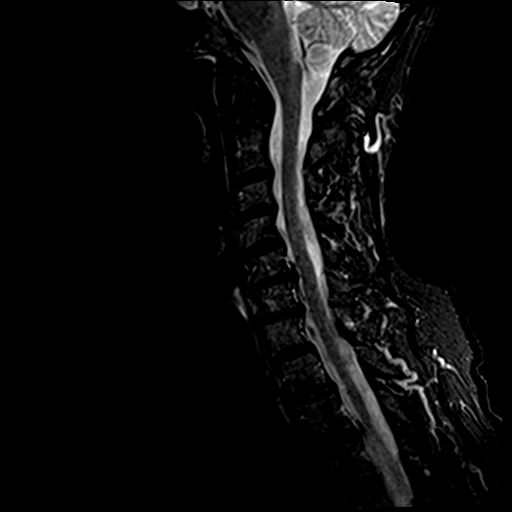
[im 10/12]
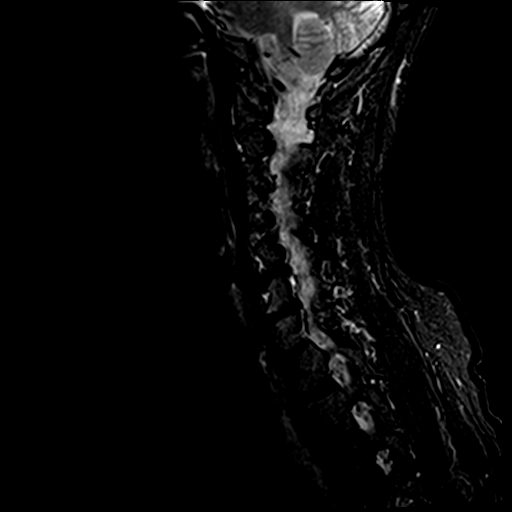
[im 12/12]
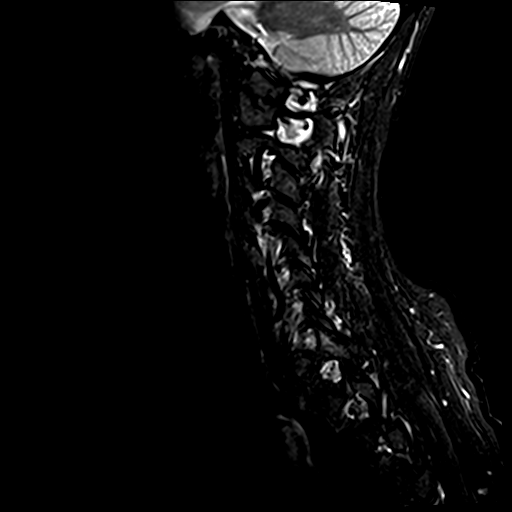

[Series 7: GRE · axial · 3.0mm · 0.35mm/px · z∈[-57,-31]mm · 3 of 26 slices shown]
[im 1/26]
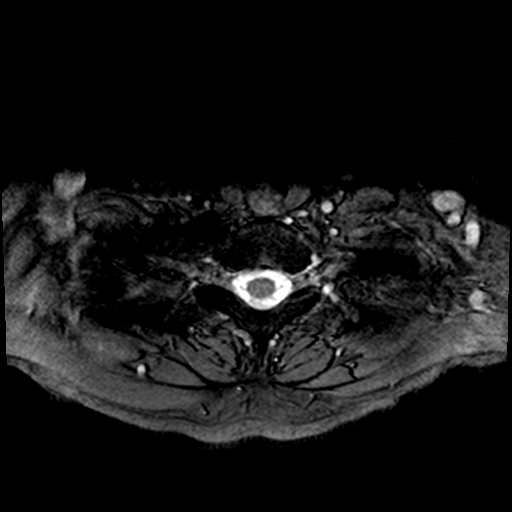
[im 4/26]
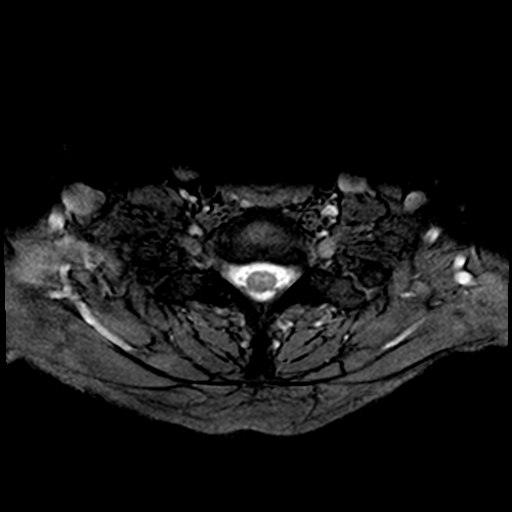
[im 8/26]
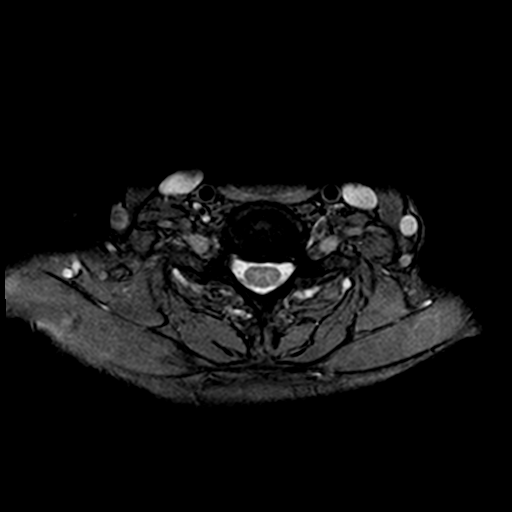

[31 of 48 positions shown; findings below may reference images not displayed]

FINDINGS: Cervical medullary junction and visualized intracranial
structures unremarkable.  Cervical spinal cord without focal signal
abnormality.  Flow void within both vertebral arteries.

Paravertebral structures unremarkable.

C2-3:  Small broad-based right posterior lateral disc osteophyte
with mild right-sided spinal stenosis.  Minimal right foraminal
narrowing.

C3-4:  Mild bulge.

C4-5:  Moderate left-sided facet joint degenerative changes.
Minimal left foraminal narrowing.

C5-6:  Moderate broad-based disc osteophyte.  Mild spinal stenosis
with minimal cord flattening.  Mild to moderate right-sided and
mild left-sided foraminal narrowing.

C6-7:  Small broad-based disc osteophyte.  Mild spinal stenosis.
Mild bilateral facet joint degenerative changes.  Minimal right-
sided and mild left-sided foraminal narrowing.

C7-T1:  Moderate to slightly marked bilateral facet joint
degenerative changes.  2 mm anterior slip C7.  Mild bulge.  Minimal
bilateral foraminal narrowing.

T1-2:  Sagittal images reveal mild bulge and tiny cephalad
extending disc protrusion.  No cord compression.

T2-3:  Sagittal images reveal mild bulge mild spinal stenosis.

T3-4:  Sagittal images reveal mild bulge.
IMPRESSION: Cervical spondylotic changes as detailed above with findings most
prominent at the C5-6 level.

## 2011-08-10 ENCOUNTER — Other Ambulatory Visit (HOSPITAL_COMMUNITY): Payer: Self-pay | Admitting: Family Medicine

## 2011-08-10 DIAGNOSIS — Z1231 Encounter for screening mammogram for malignant neoplasm of breast: Secondary | ICD-10-CM

## 2011-09-21 ENCOUNTER — Other Ambulatory Visit: Payer: Self-pay | Admitting: Orthopaedic Surgery

## 2011-09-21 DIAGNOSIS — M25511 Pain in right shoulder: Secondary | ICD-10-CM

## 2011-09-22 ENCOUNTER — Ambulatory Visit
Admission: RE | Admit: 2011-09-22 | Discharge: 2011-09-22 | Disposition: A | Discharge status: Home / Self Care | Payer: BC Managed Care – PPO | Source: Ambulatory Visit | Attending: Orthopaedic Surgery | Admitting: Orthopaedic Surgery

## 2011-09-22 DIAGNOSIS — M25511 Pain in right shoulder: Secondary | ICD-10-CM

## 2011-09-22 IMAGING — MR MR SHOULDER*R* W/O CM
4 of 5 series · 21 of 40 positions shown · non-contrast
Comparison: None

CLINICAL DATA: Right shoulder pain.

MRI OF THE RIGHT SHOULDER WITHOUT CONTRAST
TECHNIQUE: Multiplanar, multisequence MR imaging was performed. No
intravenous contrast was administered.

[Series 3: T2 fat-sat · axial · 4.0mm · 0.24mm/px · z∈[-24,+34]mm · 7 of 17 slices shown (1 of 3)]
[im 1/17]
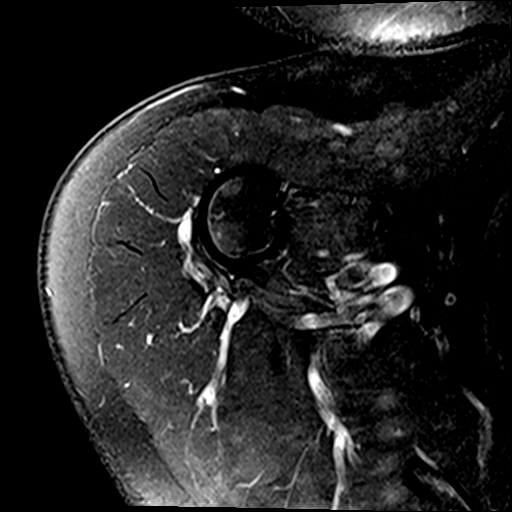
[im 3/17]
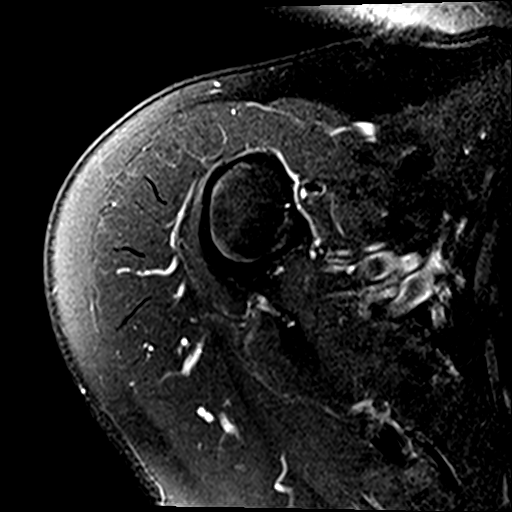
[im 5/17]
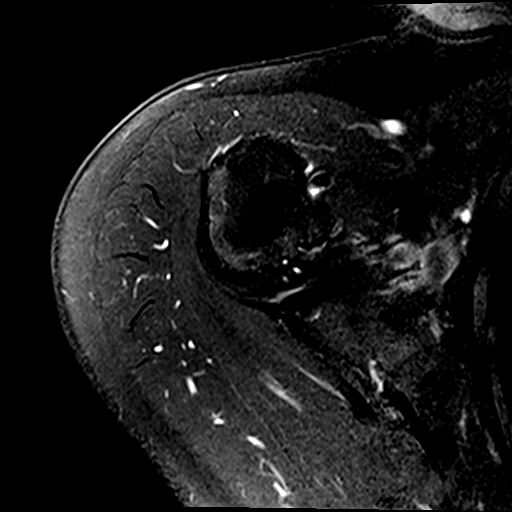
[im 7/17]
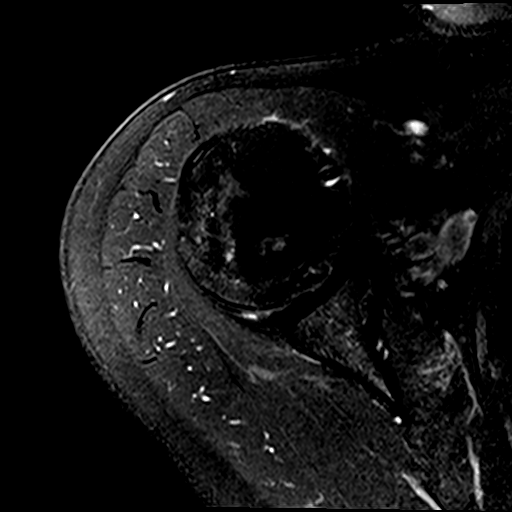
[im 10/17]
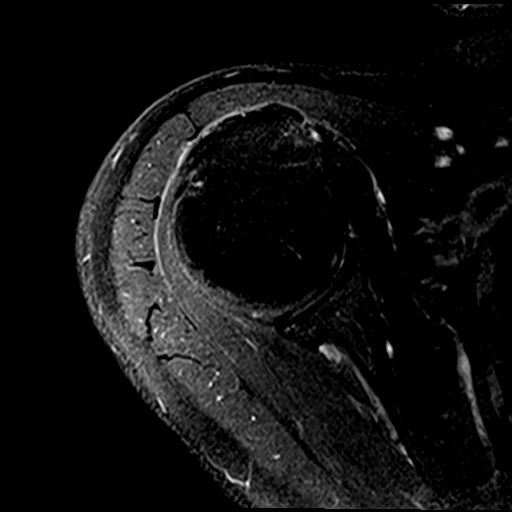
[im 12/17]
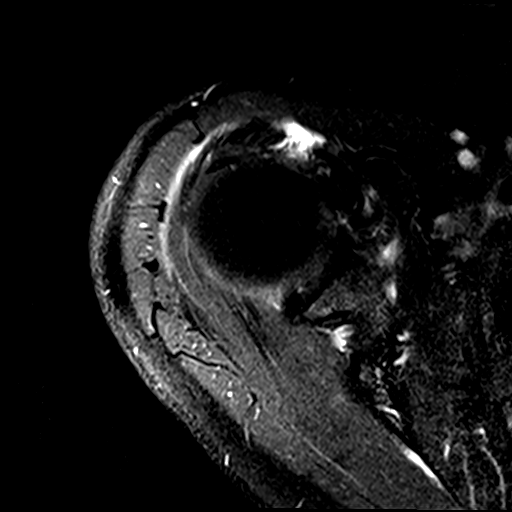
[im 14/17]
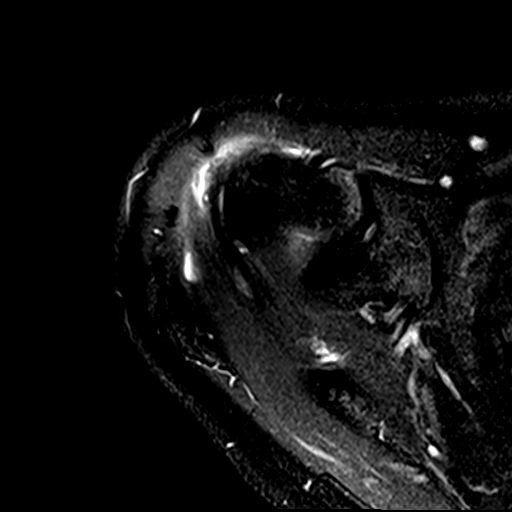

[Series 4: T2 fat-sat · sagittal · 4.0mm · 0.29mm/px · 3 of 16 slices shown (2 of 3)]
[im 3/16]
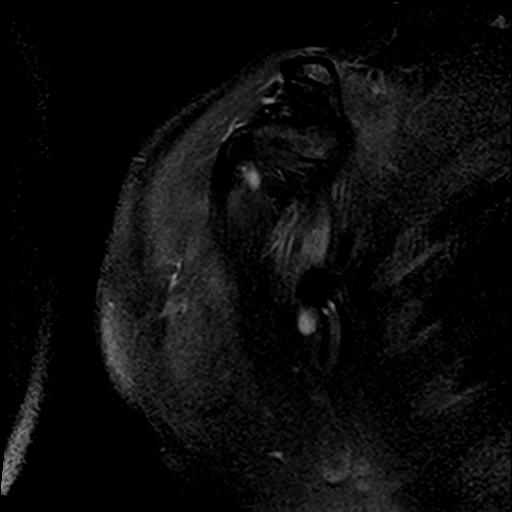
[im 9/16]
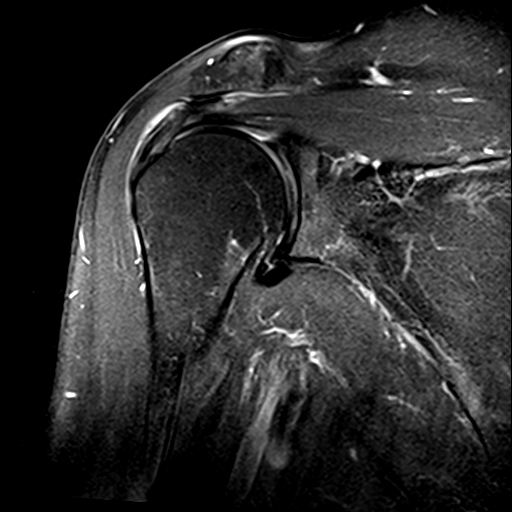
[im 13/16]
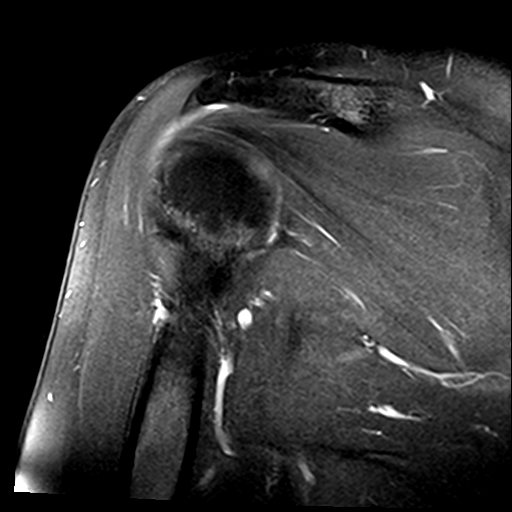

[Series 5: T2 fat-sat · oblique · 4.0mm · 0.59mm/px · 3 of 17 slices shown (3 of 3)]
[im 3/17]
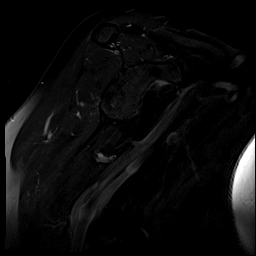
[im 10/17]
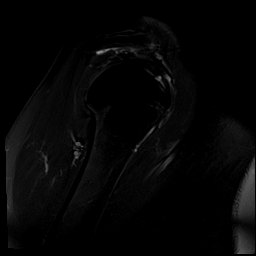
[im 14/17]
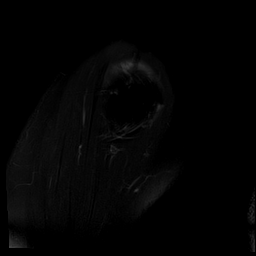

[Series 7: PD · sagittal · 4.0mm · 0.29mm/px · 8 of 16 slices shown]
[im 1/16]
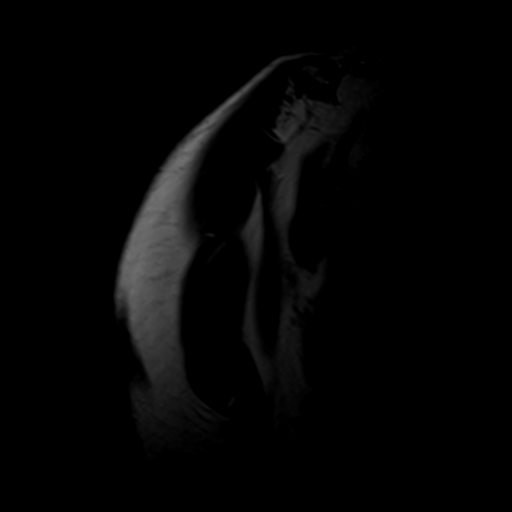
[im 3/16]
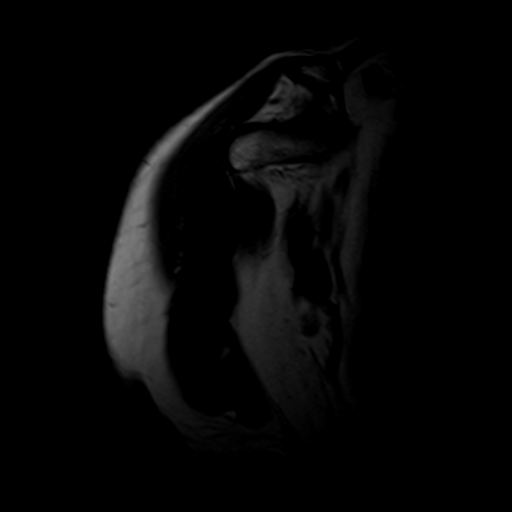
[im 5/16]
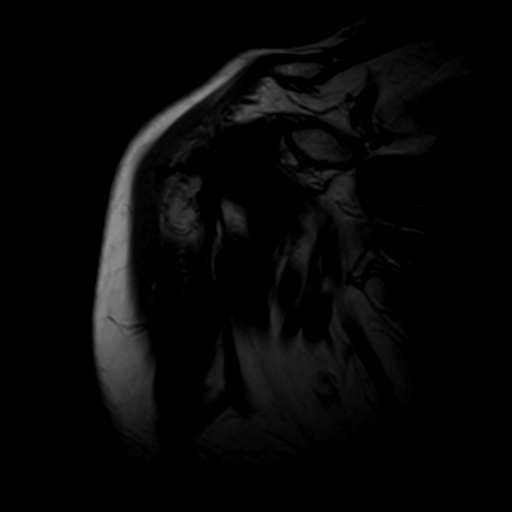
[im 7/16]
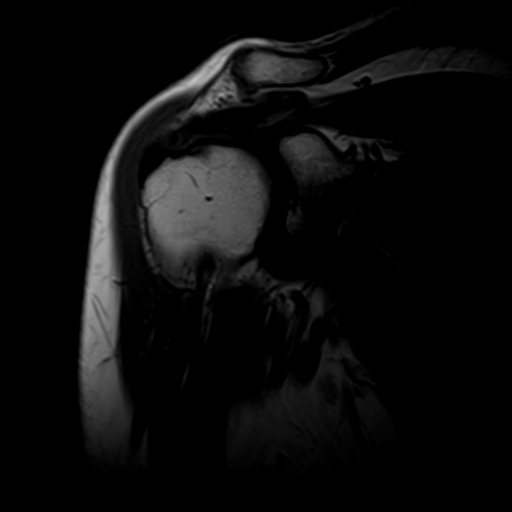
[im 9/16]
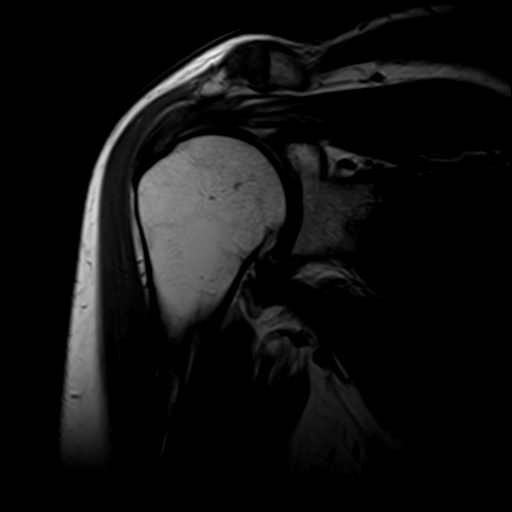
[im 11/16]
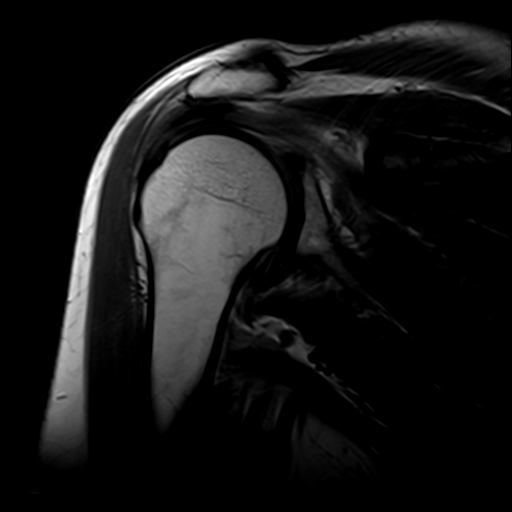
[im 13/16]
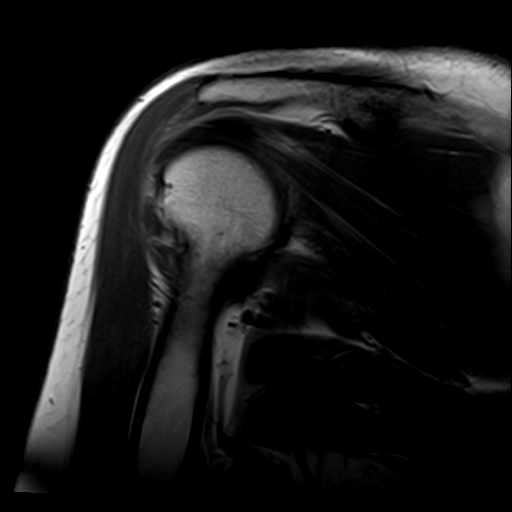
[im 16/16]
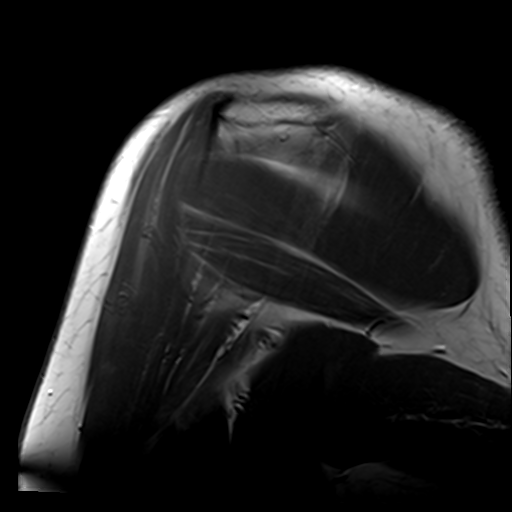

[21 of 40 positions shown; findings below may reference images not displayed]

FINDINGS: No acute bony findings.  No marrow edema or bone
contusions.  There is mild to moderate AC joint degenerative
changes.  No lateral downsloping of the type 2 acromion.  Minimal
undersurface spurring change.  The glenohumeral joint is maintained
with mild degenerative changes.  No joint effusion.

There is moderate infraspinatus tendinopathy/tendinosis with small
interstitial tears.  There is a full-thickness retracted tear
involving the supraspinatus tendon anteriorly.  The tear is 9 mm
wide and there is 13 mm of retraction.  The subscapularis tendon is
intact.

The long head biceps tendon is intact.  Mild tendinopathy involving
the intra-articular portion of the tendon.  The glenoid labra
appear normal.
IMPRESSION: 1.  Supraspinatus and infraspinatus tendinopathy with a 13 x 9 mm
full-thickness tear involving the anterior aspect of the
supraspinatus tendon.
2.  Intact biceps tendon and glenoid labra.
3.  Mild to moderate AC joint degenerative changes and mild
undersurface spurring.

## 2011-09-24 ENCOUNTER — Encounter (HOSPITAL_COMMUNITY): Payer: Self-pay | Admitting: Pharmacy Technician

## 2011-09-28 ENCOUNTER — Other Ambulatory Visit (HOSPITAL_COMMUNITY): Payer: Self-pay | Admitting: Orthopaedic Surgery

## 2011-09-28 ENCOUNTER — Other Ambulatory Visit: Payer: Self-pay

## 2011-09-28 ENCOUNTER — Encounter (HOSPITAL_COMMUNITY): Payer: Self-pay

## 2011-09-28 ENCOUNTER — Encounter (HOSPITAL_COMMUNITY)
Admission: RE | Admit: 2011-09-28 | Discharge: 2011-09-28 | Disposition: A | Discharge status: Home / Self Care | Payer: BC Managed Care – PPO | Source: Ambulatory Visit | Attending: Orthopaedic Surgery | Admitting: Orthopaedic Surgery

## 2011-09-28 HISTORY — DX: Unspecified rotator cuff tear or rupture of right shoulder, not specified as traumatic: M75.101

## 2011-09-28 HISTORY — DX: Nausea with vomiting, unspecified: R11.2

## 2011-09-28 HISTORY — DX: Headache: R51

## 2011-09-28 HISTORY — DX: Gastro-esophageal reflux disease without esophagitis: K21.9

## 2011-09-28 HISTORY — DX: Pure hypercholesterolemia, unspecified: E78.00

## 2011-09-28 HISTORY — DX: Other specified postprocedural states: Z98.890

## 2011-09-28 LAB — COMPREHENSIVE METABOLIC PANEL
ALT: 18 U/L (ref 0–35)
AST: 23 U/L (ref 0–37)
CO2: 30 mEq/L (ref 19–32)
Chloride: 104 mEq/L (ref 96–112)
Creatinine, Ser: 1.23 mg/dL — ABNORMAL HIGH (ref 0.50–1.10)
GFR calc Af Amer: 53 mL/min — ABNORMAL LOW (ref >90)
GFR calc non Af Amer: 45 mL/min — ABNORMAL LOW (ref >90)
Glucose, Bld: 116 mg/dL — ABNORMAL HIGH (ref 70–99)
Total Bilirubin: 0.4 mg/dL (ref 0.3–1.2)

## 2011-09-28 LAB — CBC
MCH: 31 pg (ref 26.0–34.0)
MCHC: 33.7 g/dL (ref 30.0–36.0)
Platelets: 248 10*3/uL (ref 150–400)
RBC: 4.61 MIL/uL (ref 3.87–5.11)

## 2011-09-28 LAB — PROTIME-INR: Prothrombin Time: 13.8 seconds (ref 11.6–15.2)

## 2011-09-28 LAB — SURGICAL PCR SCREEN: Staphylococcus aureus: NEGATIVE

## 2011-09-28 LAB — APTT: aPTT: 43 seconds — ABNORMAL HIGH (ref 24–37)

## 2011-09-28 NOTE — Pre-Procedure Instructions (Signed)
[address] [name]  09/28/2011                 Your procedure is scheduled on:  December 21 st,  FRIDAY  Report to [facility] Stay [facility] at 5:30 AM.  Call this number if you have problems the morning of surgery: [phone]   Remember:   Do not eat food:After Midnight THURSDAY.  May have clear liquids: up to 4 Hours before arrival time..1:30AM                                                                                                                                                                                                                                                                                                      Clear liquids include soda, tea, black coffee, apple or grape juice, broth.  Take these medicines the morning of surgery with A SIP OF WATER: PRILOSEC   Do not wear jewelry, make-up or nail polish.  Do not wear lotions, powders, or perfumes. You may wear deodorant.  Do not shave 48 hours prior to surgery.  Do not bring valuables to the hospital.   Contacts, dentures or bridgework may not be worn into surgery.  Leave suitcase in the car. After surgery it may be brought to your room.  For patients admitted to the hospital, checkout time is 11:00 AM the day of discharge.   Patients discharged the day of surgery will not be allowed to drive home.  Name and phone number of your driver:   DANIEL Sawdey - SPOUSE  Special Instructions: CHG Shower Use Special Wash: 1/2 bottle night before surgery and 1/2 bottle morning of surgery.   Please read over the following fact sheets that you were given: Pain Booklet, MRSA Information and Surgical Site Infection Prevention

## 2011-09-28 NOTE — Progress Notes (Signed)
NO ORDERS  AT   PAT  APPT.    WE WILL  NEED  TO  GET   UA &  CXR   DOS....& PERMIT  SIGNED I  ALSO  REQUESTED NOTES FROM DR. THOMAS BRACKBILL'S OFFICE REGARDING HER  ?STRESS &  ? ECHO  DONE 3-4 YR AGO......Marland Kitchen

## 2011-09-30 MED ORDER — VANCOMYCIN HCL IN DEXTROSE 1-5 GM/200ML-% IV SOLN: 1000.0000 mg | INTRAVENOUS | Status: DC

## 2011-09-30 NOTE — H&P (Addendum)
[facility]  A Division of [employer] [facility]  [address], [place], [place] [address] Telephone: [phone]  Fax: [phone]    PATIENT: Jeanne Schwartz, Jeanne Schwartz  MR#: [id number]  Visit Date: 09/21/2011    This 65 year old female was seen at the request of Dr. Mina Marble for evaluation of neck pain and shoulder pain.  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  She is normally followed by Dr. Kathrin Penner.     CURRENT MEDICATIONS:  She is no Lipitor, Fosamax, Estradiol.     ALLERGIES: Compazine.     PAST MEDICAL/SURGICAL HISTORY:  Previous surgeries include breast biopsy 15-20 years ago, hysterectomy in 1973, wrist fracture plate fixation on the left 2008 by Dr. Mina Marble.  Fracture healed successfully.  She has good use of her wrist.     SOCIAL HISTORY:  Patient is married to her husband, Reuel Boom, with her today; does not smoke or drink.   FAMILY HISTORY:  Positive for cervical and lumbar disk problems.     REVIEW OF SYSTEMS:   A 14-point is positive for acid reflux, arthritis, history of UTIs, and osteoporosis.     PHYSICAL EXAMINATION:  The patient is 5 feet 6 inches, 138 pounds, alert and oriented, WD, WN, NAD.   She has had 2 cortisone injections in her shoulder and states that after the most recent, that has been a few weeks, she is continuing to have good pain relief from her impingement in her right shoulder.  There is positive Spurling, mild to moderate right and left.  Forward flexion, fingerbreadth to chin, 2 fingerbreadths chin to chest, pain with extension, but she is not limited.  Rotation is  50% to the left, 75% to the right, normal.  No biceps, triceps atrophy.  She has positive Neer test on the right, negative on the left.  Yergason test negative.  Speeds test right and left is negative.  Reflexes are 1+ and symmetrical.  Sensor testing is normal.  Well-healed left volar wrist incision without swelling.  Nail beds are normal.  Lower extremity reflexes are 2+ and symmetrical.  No supraclavicular lymphadenopathy.  No accessory muscle inspiratory effort.  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  Patient has some weakness with isolated supraspinatus testing.  I discussed with her that she may have a full thickness rotator cuff tear and that may be a portion of some of the pain that she is having, which is more symptomatic on the right than left.  She states she is interested in getting her neck problem fixed since it has been going on for more than 5-years, gradually progressing, at times it is painful enough that she cries.     PLAN:  I would recommend retaining an MRI scan of the right shoulder so that we can assess the shoulder.  I think she likely may have a small rotator cuff tear involving the supraspinatus, there is no subscap or posterior rotator cuff weakness.  If she has a retracted tear in her shoulder she might want to consider getting her shoulder taken care of before her cervical spine.   Thank you for the opportunity to share in her care.     For additional information please see handwritten notes, reports, orders and prescriptions in this chart. [facility]  A Division of [facility]  [address], [place], [place] [address] Telephone: [phone]  Fax: [phone]    PATIENT: Jeanne Schwartz, Jeanne Schwartz  MR#: [id number]  Visit Date: 09/23/2011    Patient returns for persistent  problems with cervical spondylosis.  As noted, she has been through cervical traction, physical therapy for more than 2 months, taken multiple anti-inflammatories, had a Medrol Dosepak; she has had 2 injections in her shoulder for persistent right shoulder symptoms.  She is not requiring any narcotic pain medication.  Her medications include Lipitor, Fosamax and estradiol.  She is continuing to get some relief from the most recent subacromial injection and due some weakness found on supraspinatus testing, an MRI scan was obtained and she is here for review.  Neck pain is ongoing.  It bothers her with activities of daily living.  Has not responded to traction, physical therapy, stretching and has been gradually increasingly painful for more than 5 years.    MRI scan cervical spine from 07/21/2011 demonstrated spondylitic changes, worse at C5-6 with mild stenosis, minimal cord flattening and moderate right side and mild left side foraminal narrowing.  Broad base disk osteophytes at C6-7 with mild stenosis.  Minimal changes at other levels.     MRI scan right shoulder was obtained and this shows 13 x 9 mm full thickness tear involving the anterior aspect of the supraspinatus.  Labrum and biceps tendon is normal.  No evidence of adhesive capsulitis.  Mild AC joint degenerative spurring and subacromial spur noted.     Shoulder still seems to be doing well after the injection and she states she would like to proceed with cervical fusion, which we had discussed previously on her last visit.  She is still followed by Dr. Kathrin Penner.     PHYSICAL EXAMINATION:  Patient is 5 feet 6 inches, 138 pounds.  Alert and oriented.  Height 5 feet 6 inches, weight 138.  WD.  WN.  Extraocular movements intact.  Good visual acuity.  Normal teeth.  Pharynx is clear.  Tympanic membranes intact.  Positive Spurling right and left.  Pain with brachial plexus compression.  Negative Lhermitte.  Upper extremity reflexes are symmetrical.   Positive impingement on the right, which is mild.  Weakness with supraspinatus testing.  Well-healed left lower wrist incision.  Lungs are clear.  Heart regular rate and rhythm without murmur or gallop.  Abdomen soft, nontender.  Lower extremity reflexes are normal.  Normal heel-toe gait.  No rash over exposed skin.  No lymphadenopathy.  No venous stasis changes.   ASSESSMENT/PLAN:  Cervical spondylosis C5-6 and C6-7.  We discussed options and I would recommend a 2-level cervical fusion for the chronic condition she has.  It has been going on now for greater than 5 years.  Failed physical therapy with traction, anti-inflammatories, pain medication, cortisone.  She states she would like to have this done before the end of the year.  I reviewed the rotator cuff images, as well as the cervical spine MRI images with the patient and her husband.  She understands that there is a full thickness rotator cuff tear.  It is likely  that the injection, which is giving her some pain relief, will wear off with time.  She understands that rotator cuff can tear worse with time and if she has persistent symptoms or shoulder symptoms do not settle down, then arthroscopy and rotator cuff repair would be an option for that problem.  Risks of surgery discussed.  She already has problems with vocal cord from her stellate blocks.  We discussed exposure, position of recurrent laryngeal nerve position, risks of surgery including nonunion, re-operation, need for posterior cervical fusion if the anterior procedure did not heal, which is less than a 10% chance with a 2-level fusion with allograft and plating.  Postoperative use of a collar.  She has already been using her collar off and on when her neck pain is severe and she ends up staying at home using ice or heat and wearing her collar.  We discussed allograft versus autograft, 1 versus 2 level and with the broad base changes at C6-7, I would recommend a 2-level procedure.  She has  recently had some more symptoms on her left side of her shoulder and her arm and has more narrowing at C6-7 level on the left.  Questions were listed and answered.  She would like to proceed.   For additional information please see handwritten notes, reports, orders and prescriptions in this chart.      Maika Kaczmarek C. Ophelia Charter, M.D.    Auto-Authenticated by Veverly Fells. Ophelia Charter, M.D.  ZOX/WR/UE DD: 09/24/2011  DT: 09/27/2011   cc: Valeria Batman, M.D.(Emdat Autof[employer])       Loraine Leriche C. Ophelia Charter, M.D.    Auto-Authenticated by Veverly Fells. Ophelia Charter, M.D.  MCY/lp/af DD: 09/24/2011  DT: 09/27/2011   cc: Valeria Batman, M.D.(Emdat [employer])  Auto-Authenticated by Veverly Fells Ophelia Charter, M.D.  MCY/lp/af DD: 09/24/2011  DT: 09/27/2011   cc: Valeria Batman, M.D.(Emdat [employer])       Loraine Leriche C. Ophelia Charter, M.D.    Auto-Authenticated by Veverly Fells. Ophelia Charter, M.D.  MCY/lp/af DD: 09/24/2011  DT: 09/27/2011   cc: Valeria Batman, M.D.(Emdat [employer])       Loraine Leriche C. Ophelia Charter, M.D.    Auto-Authenticated by Veverly Fells. Ophelia Charter, M.D.  MCY/lp/af DD: 09/24/2011  DT: 09/27/2011   cc: Valeria Batman, M.D.([employer]mdat [employer])

## 2011-10-01 ENCOUNTER — Encounter (HOSPITAL_COMMUNITY)
Admission: RE | Disposition: A | Discharge status: Home / Self Care | Payer: Self-pay | Source: Ambulatory Visit | Attending: Orthopaedic Surgery

## 2011-10-01 ENCOUNTER — Encounter (HOSPITAL_COMMUNITY): Payer: Self-pay | Admitting: Anesthesiology

## 2011-10-01 ENCOUNTER — Ambulatory Visit (HOSPITAL_COMMUNITY): Payer: BC Managed Care – PPO

## 2011-10-01 ENCOUNTER — Ambulatory Visit (HOSPITAL_COMMUNITY)
Admission: RE | Admit: 2011-10-01 | Discharge: 2011-10-02 | Disposition: A | Discharge status: Home / Self Care | Payer: BC Managed Care – PPO | Source: Ambulatory Visit | Attending: Orthopaedic Surgery | Admitting: Orthopaedic Surgery

## 2011-10-01 ENCOUNTER — Ambulatory Visit (HOSPITAL_COMMUNITY): Payer: BC Managed Care – PPO | Admitting: Anesthesiology

## 2011-10-01 ENCOUNTER — Encounter (HOSPITAL_COMMUNITY): Payer: Self-pay | Admitting: *Deleted

## 2011-10-01 DIAGNOSIS — M719 Bursopathy, unspecified: Secondary | ICD-10-CM | POA: Insufficient documentation

## 2011-10-01 DIAGNOSIS — M75101 Unspecified rotator cuff tear or rupture of right shoulder, not specified as traumatic: Secondary | ICD-10-CM | POA: Diagnosis present

## 2011-10-01 DIAGNOSIS — Z01812 Encounter for preprocedural laboratory examination: Secondary | ICD-10-CM | POA: Insufficient documentation

## 2011-10-01 DIAGNOSIS — M67919 Unspecified disorder of synovium and tendon, unspecified shoulder: Secondary | ICD-10-CM | POA: Insufficient documentation

## 2011-10-01 DIAGNOSIS — M810 Age-related osteoporosis without current pathological fracture: Secondary | ICD-10-CM | POA: Insufficient documentation

## 2011-10-01 DIAGNOSIS — M47812 Spondylosis without myelopathy or radiculopathy, cervical region: Secondary | ICD-10-CM

## 2011-10-01 DIAGNOSIS — K219 Gastro-esophageal reflux disease without esophagitis: Secondary | ICD-10-CM | POA: Insufficient documentation

## 2011-10-01 HISTORY — PX: ANTERIOR CERVICAL DECOMP/DISCECTOMY FUSION: SHX1161

## 2011-10-01 LAB — URINALYSIS, ROUTINE W REFLEX MICROSCOPIC
Ketones, ur: NEGATIVE mg/dL
Nitrite: NEGATIVE
Specific Gravity, Urine: 1.018 (ref 1.005–1.030)
Urobilinogen, UA: 0.2 mg/dL (ref 0.0–1.0)
pH: 8.5 — ABNORMAL HIGH (ref 5.0–8.0)

## 2011-10-01 LAB — URINE MICROSCOPIC-ADD ON

## 2011-10-01 IMAGING — CR DG CHEST 2V
2 series · 2 of 2 positions shown · non-contrast
Comparison: 12/17/2008

CLINICAL DATA: Preoperative radiograph for cervical spondylosis.

CHEST - 2 VIEW

[view not recorded (1 of 2)]
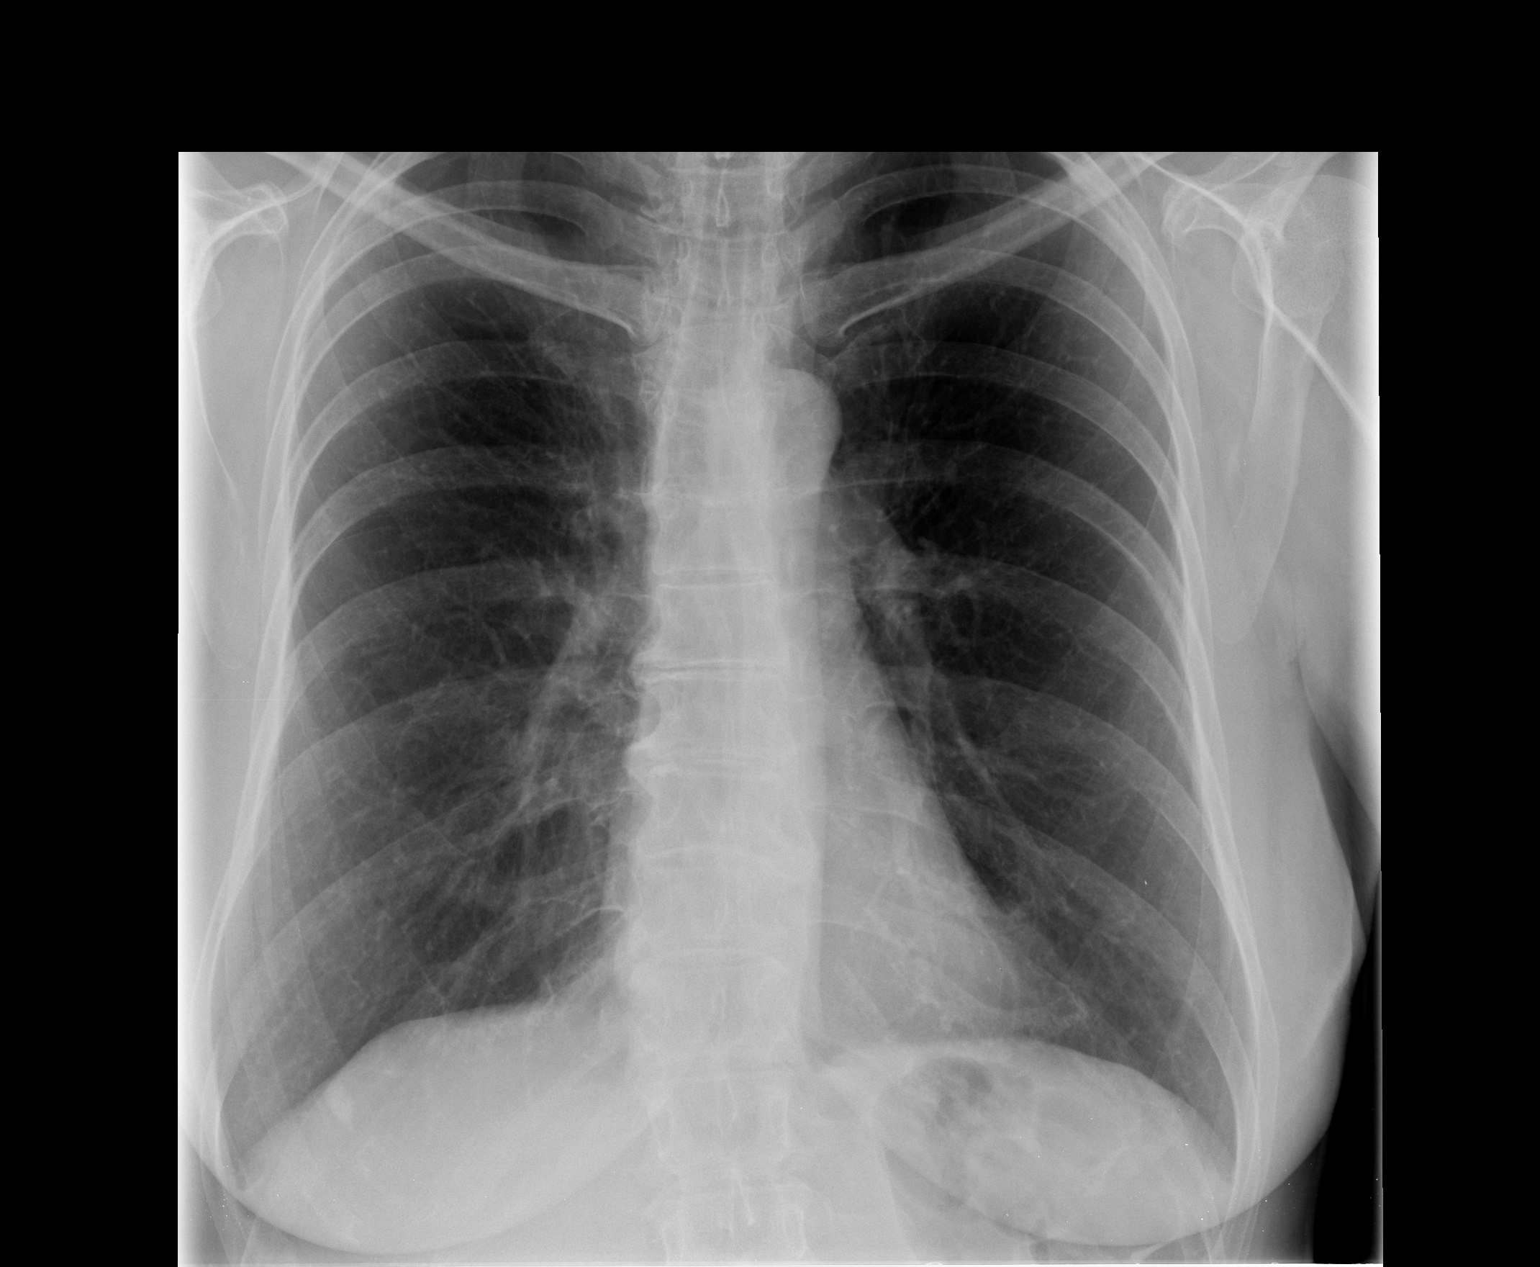

[view not recorded (2 of 2)]
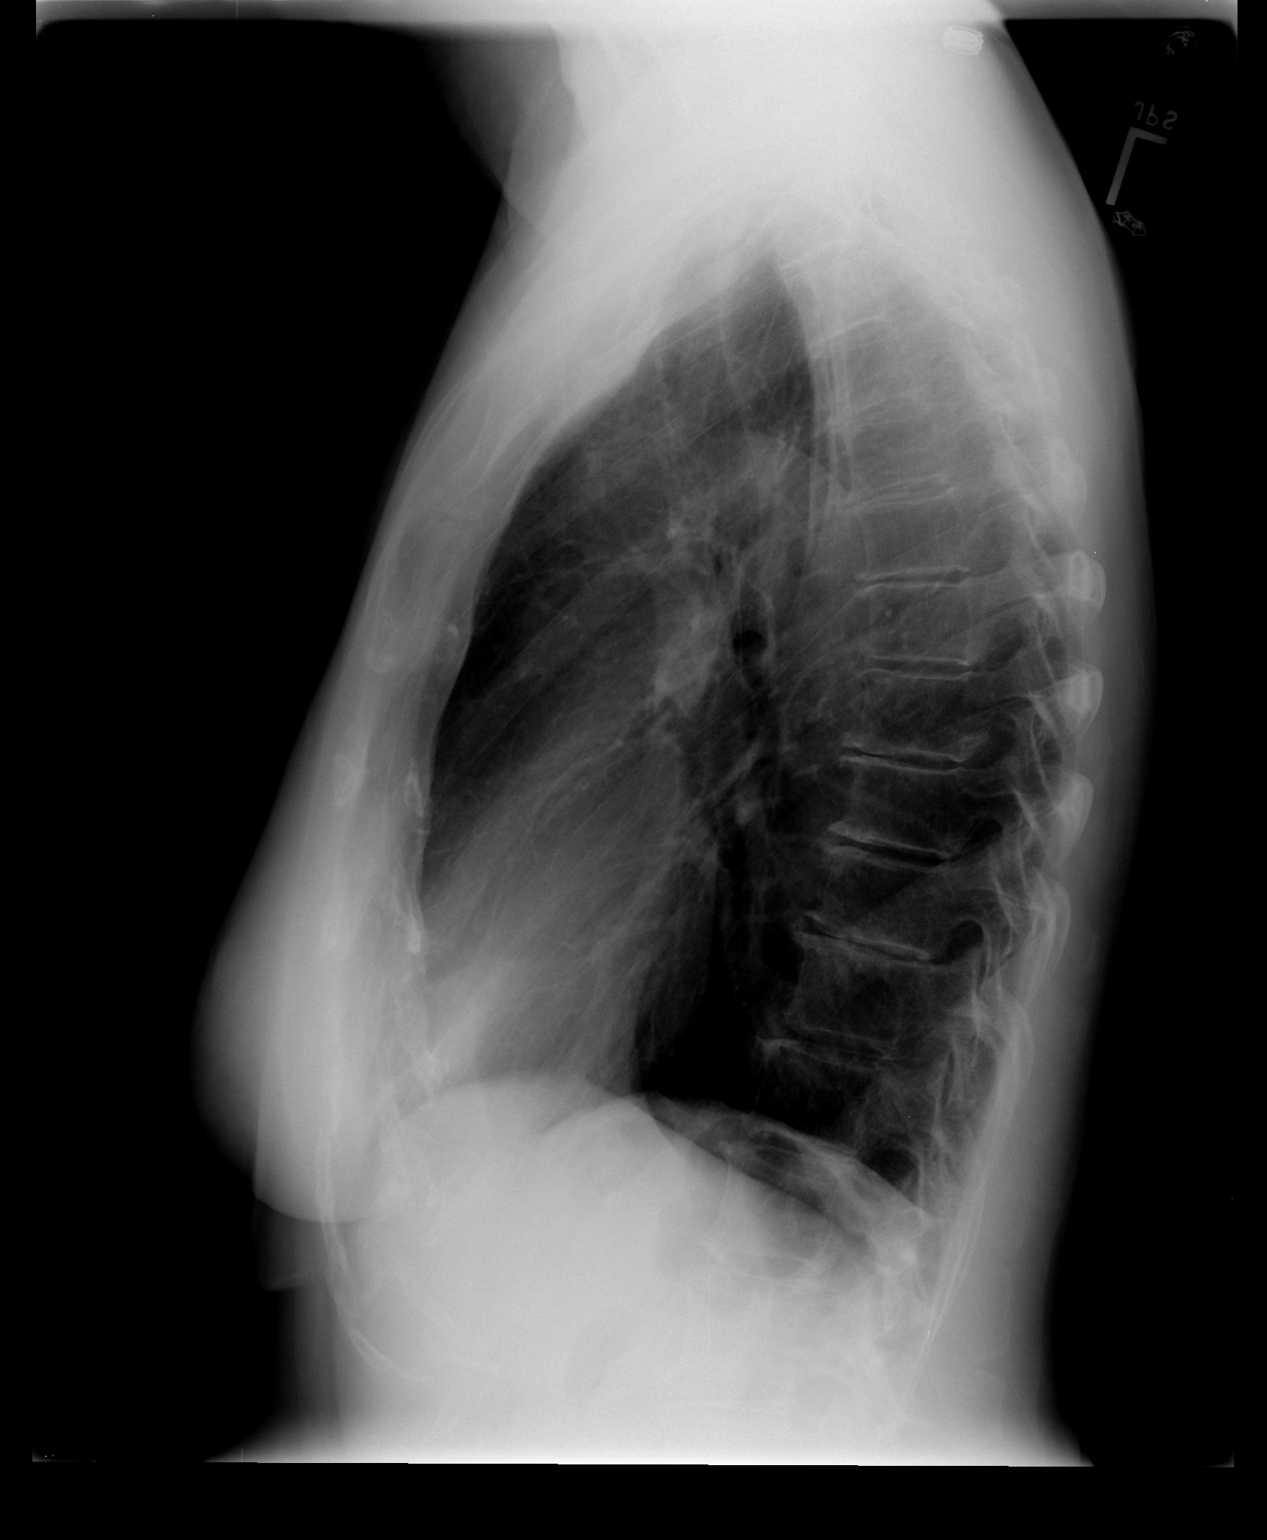

[2 of 2 positions shown; findings below may reference images not displayed]

FINDINGS: Well aerated without focal consolidation.  No pleural
effusion or pneumothorax.  Cardiomediastinal contours are within
normal limits.  No acute osseous abnormality. Multilevel
degenerative changes.
IMPRESSION: No focal consolidation.

## 2011-10-01 SURGERY — ANTERIOR CERVICAL DECOMPRESSION/DISCECTOMY FUSION 2 LEVELS
Anesthesia: General | Site: Neck | Wound class: Clean

## 2011-10-01 MED ORDER — BUPIVACAINE-EPINEPHRINE 0.25% -1:200000 IJ SOLN
INTRAMUSCULAR | Status: DC | PRN
  Administered 2011-10-01: 6 mL

## 2011-10-01 MED ORDER — ONDANSETRON HCL 4 MG/2ML IJ SOLN
4.0000 mg | INTRAMUSCULAR | Status: DC | PRN
  Administered 2011-10-01: 4 mg via INTRAVENOUS
  Filled 2011-10-01: qty 2

## 2011-10-01 MED ORDER — CEFAZOLIN SODIUM 1-5 GM-% IV SOLN
1.0000 g | Freq: Once | INTRAVENOUS | Status: AC
  Administered 2011-10-01: 1 g via INTRAVENOUS
  Filled 2011-10-01: qty 50

## 2011-10-01 MED ORDER — SODIUM CHLORIDE 0.9 % IJ SOLN: 3.0000 mL | Freq: Two times a day (BID) | INTRAMUSCULAR | Status: DC

## 2011-10-01 MED ORDER — CEFAZOLIN SODIUM 1-5 GM-% IV SOLN
INTRAVENOUS | Status: AC
  Filled 2011-10-01: qty 50

## 2011-10-01 MED ORDER — LACTATED RINGERS IV SOLN
INTRAVENOUS | Status: DC | PRN
  Administered 2011-10-01: 07:00:00 via INTRAVENOUS

## 2011-10-01 MED ORDER — MIDAZOLAM HCL 5 MG/5ML IJ SOLN
INTRAMUSCULAR | Status: DC | PRN
  Administered 2011-10-01: 2 mg via INTRAVENOUS

## 2011-10-01 MED ORDER — PROPOFOL 10 MG/ML IV EMUL
INTRAVENOUS | Status: DC | PRN
  Administered 2011-10-01: 200 mg via INTRAVENOUS

## 2011-10-01 MED ORDER — ACETAMINOPHEN 325 MG PO TABS
650.0000 mg | ORAL_TABLET | ORAL | Status: DC | PRN
  Administered 2011-10-01: 650 mg via ORAL
  Filled 2011-10-01: qty 2

## 2011-10-01 MED ORDER — LIDOCAINE HCL (CARDIAC) 20 MG/ML IV SOLN
INTRAVENOUS | Status: DC | PRN
  Administered 2011-10-01: 80 mg via INTRAVENOUS

## 2011-10-01 MED ORDER — ROCURONIUM BROMIDE 100 MG/10ML IV SOLN
INTRAVENOUS | Status: DC | PRN
  Administered 2011-10-01: 50 mg via INTRAVENOUS

## 2011-10-01 MED ORDER — PHENOL 1.4 % MT LIQD
1.0000 | OROMUCOSAL | Status: DC | PRN
  Filled 2011-10-01: qty 177

## 2011-10-01 MED ORDER — MENTHOL 3 MG MT LOZG: 1.0000 | LOZENGE | OROMUCOSAL | Status: DC | PRN

## 2011-10-01 MED ORDER — DOCUSATE SODIUM 100 MG PO CAPS
100.0000 mg | ORAL_CAPSULE | Freq: Two times a day (BID) | ORAL | Status: DC
  Administered 2011-10-01 – 2011-10-02 (×3): 100 mg via ORAL
  Filled 2011-10-01 (×4): qty 1

## 2011-10-01 MED ORDER — KETOROLAC TROMETHAMINE 30 MG/ML IJ SOLN
INTRAMUSCULAR | Status: DC | PRN
  Administered 2011-10-01: 30 mg via INTRAVENOUS

## 2011-10-01 MED ORDER — GLYCOPYRROLATE 0.2 MG/ML IJ SOLN
INTRAMUSCULAR | Status: DC | PRN
  Administered 2011-10-01: .8 mg via INTRAVENOUS

## 2011-10-01 MED ORDER — PANTOPRAZOLE SODIUM 40 MG PO TBEC
40.0000 mg | DELAYED_RELEASE_TABLET | Freq: Every day | ORAL | Status: DC
  Filled 2011-10-01: qty 1

## 2011-10-01 MED ORDER — ONDANSETRON HCL 4 MG/2ML IJ SOLN: 4.0000 mg | Freq: Once | INTRAMUSCULAR | Status: DC | PRN

## 2011-10-01 MED ORDER — NEOSTIGMINE METHYLSULFATE 1 MG/ML IJ SOLN
INTRAMUSCULAR | Status: DC | PRN
  Administered 2011-10-01: 5 mg via INTRAVENOUS

## 2011-10-01 MED ORDER — NITROFURANTOIN MACROCRYSTAL 50 MG PO CAPS
50.0000 mg | ORAL_CAPSULE | Freq: Four times a day (QID) | ORAL | Status: DC
  Administered 2011-10-01 – 2011-10-02 (×3): 50 mg via ORAL
  Filled 2011-10-01 (×7): qty 1

## 2011-10-01 MED ORDER — KETOROLAC TROMETHAMINE 30 MG/ML IJ SOLN: 30.0000 mg | Freq: Once | INTRAMUSCULAR | Status: DC

## 2011-10-01 MED ORDER — ZOLPIDEM TARTRATE 5 MG PO TABS: 5.0000 mg | ORAL_TABLET | Freq: Every evening | ORAL | Status: DC | PRN

## 2011-10-01 MED ORDER — SENNOSIDES-DOCUSATE SODIUM 8.6-50 MG PO TABS: 1.0000 | ORAL_TABLET | Freq: Every evening | ORAL | Status: DC | PRN

## 2011-10-01 MED ORDER — SIMVASTATIN 20 MG PO TABS
20.0000 mg | ORAL_TABLET | Freq: Every day | ORAL | Status: DC
  Filled 2011-10-01 (×2): qty 1

## 2011-10-01 MED ORDER — ASPIRIN 81 MG PO CHEW
81.0000 mg | CHEWABLE_TABLET | Freq: Every day | ORAL | Status: DC
  Administered 2011-10-02: 81 mg via ORAL
  Filled 2011-10-01 (×2): qty 1

## 2011-10-01 MED ORDER — FENTANYL CITRATE 0.05 MG/ML IJ SOLN
INTRAMUSCULAR | Status: DC | PRN
  Administered 2011-10-01: 100 ug via INTRAVENOUS
  Administered 2011-10-01 (×4): 50 ug via INTRAVENOUS

## 2011-10-01 MED ORDER — ONDANSETRON HCL 4 MG/2ML IJ SOLN
INTRAMUSCULAR | Status: DC | PRN
  Administered 2011-10-01: 4 mg via INTRAVENOUS

## 2011-10-01 MED ORDER — ACETAMINOPHEN 650 MG RE SUPP: 650.0000 mg | RECTAL | Status: DC | PRN

## 2011-10-01 MED ORDER — KCL IN DEXTROSE-NACL 20-5-0.45 MEQ/L-%-% IV SOLN
INTRAVENOUS | Status: DC
  Filled 2011-10-01 (×3): qty 1000

## 2011-10-01 MED ORDER — HEMOSTATIC AGENTS (NO CHARGE) OPTIME
TOPICAL | Status: DC | PRN
  Administered 2011-10-01: 1 via TOPICAL

## 2011-10-01 MED ORDER — BISACODYL 10 MG RE SUPP: 10.0000 mg | Freq: Every day | RECTAL | Status: DC | PRN

## 2011-10-01 MED ORDER — HYDROCODONE-ACETAMINOPHEN 5-325 MG PO TABS: 1.0000 | ORAL_TABLET | ORAL | Status: DC | PRN

## 2011-10-01 MED ORDER — SODIUM CHLORIDE 0.9 % IJ SOLN: 3.0000 mL | INTRAMUSCULAR | Status: DC | PRN

## 2011-10-01 MED ORDER — OXYCODONE-ACETAMINOPHEN 5-325 MG PO TABS: 1.0000 | ORAL_TABLET | ORAL | Status: DC | PRN

## 2011-10-01 MED ORDER — DEXAMETHASONE SODIUM PHOSPHATE 10 MG/ML IJ SOLN
INTRAMUSCULAR | Status: DC | PRN
  Administered 2011-10-01: 8 mg via INTRAVENOUS

## 2011-10-01 MED ORDER — CEFAZOLIN SODIUM 1-5 GM-% IV SOLN
1.0000 g | Freq: Three times a day (TID) | INTRAVENOUS | Status: AC
  Administered 2011-10-01: 1 g via INTRAVENOUS
  Filled 2011-10-01: qty 50

## 2011-10-01 MED ORDER — MORPHINE SULFATE 2 MG/ML IJ SOLN: 0.0500 mg/kg | INTRAMUSCULAR | Status: DC | PRN

## 2011-10-01 MED ORDER — HYDROMORPHONE HCL PF 1 MG/ML IJ SOLN: 0.2500 mg | INTRAMUSCULAR | Status: DC | PRN

## 2011-10-01 MED ORDER — METHOCARBAMOL 100 MG/ML IJ SOLN
500.0000 mg | Freq: Four times a day (QID) | INTRAMUSCULAR | Status: DC | PRN
  Filled 2011-10-01: qty 5

## 2011-10-01 MED ORDER — METHOCARBAMOL 500 MG PO TABS: 500.0000 mg | ORAL_TABLET | Freq: Four times a day (QID) | ORAL | Status: DC | PRN

## 2011-10-01 MED ORDER — MORPHINE SULFATE 4 MG/ML IJ SOLN: 1.0000 mg | INTRAMUSCULAR | Status: DC | PRN

## 2011-10-01 MED ORDER — MEPERIDINE HCL 25 MG/ML IJ SOLN: 6.2500 mg | INTRAMUSCULAR | Status: DC | PRN

## 2011-10-01 SURGICAL SUPPLY — 54 items
APL SKNCLS STERI-STRIP NONHPOA (GAUZE/BANDAGES/DRESSINGS) ×1
BENZOIN TINCTURE PRP APPL 2/3 (GAUZE/BANDAGES/DRESSINGS) ×3 IMPLANT
BLADE SURG ROTATE 9660 (MISCELLANEOUS) IMPLANT
BUR ROUND FLUTED 4 SOFT TCH (BURR) IMPLANT
CLOSURE STERI STRIP 1/2 X4 (GAUZE/BANDAGES/DRESSINGS) ×1 IMPLANT
CLOTH BEACON ORANGE TIMEOUT ST (SAFETY) ×2 IMPLANT
COLLAR CERV LO CONTOUR FIRM DE (SOFTGOODS) ×2 IMPLANT
COMPOSITE CERV ALLOGRAFT 6MM (Orthopedic Implant) ×1 IMPLANT
COMPOSITE CERV ALLOGRAFT 7MM (Orthopedic Implant) ×1 IMPLANT
CORDS BIPOLAR (ELECTRODE) ×2 IMPLANT
COVER SURGICAL LIGHT HANDLE (MISCELLANEOUS) ×2 IMPLANT
DRAPE C-ARM 42X72 X-RAY (DRAPES) ×2 IMPLANT
DRAPE MICROSCOPE LEICA (MISCELLANEOUS) ×2 IMPLANT
DRAPE PROXIMA HALF (DRAPES) ×2 IMPLANT
DRILL BIT VUELOCK 12MML (BIT) IMPLANT
DURAPREP 6ML APPLICATOR 50/CS (WOUND CARE) ×2 IMPLANT
ELECT COATED BLADE 2.86 ST (ELECTRODE) ×2 IMPLANT
ELECT REM PT RETURN 9FT ADLT (ELECTROSURGICAL) ×2
ELECTRODE REM PT RTRN 9FT ADLT (ELECTROSURGICAL) ×1 IMPLANT
EVACUATOR 1/8 PVC DRAIN (DRAIN) ×2 IMPLANT
GAUZE SPONGE 4X4 12PLY STRL LF (GAUZE/BANDAGES/DRESSINGS) ×1 IMPLANT
GAUZE XEROFORM 1X8 LF (GAUZE/BANDAGES/DRESSINGS) ×2 IMPLANT
GLOVE BIOGEL PI IND STRL 7.5 (GLOVE) ×1 IMPLANT
GLOVE BIOGEL PI IND STRL 8 (GLOVE) ×1 IMPLANT
GLOVE BIOGEL PI INDICATOR 7.5 (GLOVE) ×1
GLOVE BIOGEL PI INDICATOR 8 (GLOVE) ×1
GLOVE ECLIPSE 7.0 STRL STRAW (GLOVE) ×2 IMPLANT
GLOVE ORTHO TXT STRL SZ7.5 (GLOVE) ×2 IMPLANT
GOWN PREVENTION PLUS LG XLONG (DISPOSABLE) IMPLANT
GOWN PREVENTION PLUS XLARGE (GOWN DISPOSABLE) ×2 IMPLANT
GOWN STRL NON-REIN LRG LVL3 (GOWN DISPOSABLE) ×4 IMPLANT
HEAD HALTER (SOFTGOODS) ×2 IMPLANT
HEMOSTAT SURGICEL 2X14 (HEMOSTASIS) IMPLANT
KIT BASIN OR (CUSTOM PROCEDURE TRAY) ×2 IMPLANT
KIT ROOM TURNOVER OR (KITS) ×2 IMPLANT
MANIFOLD NEPTUNE II (INSTRUMENTS) ×2 IMPLANT
NDL 25GX 5/8IN NON SAFETY (NEEDLE) ×1 IMPLANT
NEEDLE 25GX 5/8IN NON SAFETY (NEEDLE) ×2 IMPLANT
NS IRRIG 1000ML POUR BTL (IV SOLUTION) ×2 IMPLANT
PACK ORTHO CERVICAL (CUSTOM PROCEDURE TRAY) ×2 IMPLANT
PAD ARMBOARD 7.5X6 YLW CONV (MISCELLANEOUS) ×4 IMPLANT
PATTIES SURGICAL .5 X.5 (GAUZE/BANDAGES/DRESSINGS) IMPLANT
SPONGE GAUZE 4X4 12PLY (GAUZE/BANDAGES/DRESSINGS) ×2 IMPLANT
STAPLER VISISTAT 35W (STAPLE) ×2 IMPLANT
STRIP CLOSURE SKIN 1/2X4 (GAUZE/BANDAGES/DRESSINGS) ×2 IMPLANT
SURGIFLO TRUKIT (HEMOSTASIS) IMPLANT
SUT VIC AB 3-0 X1 27 (SUTURE) ×2 IMPLANT
SUT VICRYL 4-0 PS2 18IN ABS (SUTURE) ×4 IMPLANT
SYR 30ML SLIP (SYRINGE) ×2 IMPLANT
SYR BULB 3OZ (MISCELLANEOUS) ×2 IMPLANT
TAPE CLOTH SURG 4X10 WHT LF (GAUZE/BANDAGES/DRESSINGS) ×1 IMPLANT
TOWEL OR 17X24 6PK STRL BLUE (TOWEL DISPOSABLE) ×2 IMPLANT
TOWEL OR 17X26 10 PK STRL BLUE (TOWEL DISPOSABLE) ×2 IMPLANT
WATER STERILE IRR 1000ML POUR (IV SOLUTION) ×2 IMPLANT

## 2011-10-01 NOTE — Brief Op Note (Signed)
10/01/2011  10:07 AM  PATIENT:  Jeanne Schwartz  65 y.o. female  PRE-OPERATIVE DIAGNOSIS:  C5-6, C6-7 Spondylosis  POST-OPERATIVE DIAGNOSIS:  C5-6, C6-7 Spondylosis  PROCEDURE:  Procedure(s): ANTERIOR CERVICAL DECOMPRESSION/DISCECTOMY FUSION 2 LEVELS  SURGEON:  Surgeon(s): Eldred Manges  PHYSICIAN ASSISTANT: Maud Deed PAC  ASSISTANTS: none   ANESTHESIA:   general  EBL:  Total I/O In: 1600 [I.V.:1600] Out: -   BLOOD ADMINISTERED:none  DRAINS: (one) Hemovact drain(s) in the neck with  Suction Open   LOCAL MEDICATIONS USED:  MARCAINE 6 CC  SPECIMEN:  No Specimen  DISPOSITION OF SPECIMEN:  N/A  COUNTS:  YES  TOURNIQUET:  * No tourniquets in log *  DICTATION: .Note written in EPIC  PLAN OF CARE: Admit for overnight observation  PATIENT DISPOSITION:  PACU - hemodynamically stable.   Delay start of Pharmacological VTE agent (>24hrs) due to surgical blood loss or risk of bleeding:  {YES/NO/NOT APPLICABLE:20182

## 2011-10-01 NOTE — Anesthesia Preprocedure Evaluation (Addendum)
Anesthesia Evaluation  Patient identified by MRN, date of birth, ID band Patient awake    Reviewed: Allergy & Precautions, H&P , NPO status , Patient's Chart, lab work & pertinent test results  Airway Mallampati: I TM Distance: >3 FB Neck ROM: Full    Dental  (+) Teeth Intact and Dental Advisory Given   Pulmonary  clear to auscultation        Cardiovascular Regular Normal    Neuro/Psych    GI/Hepatic GERD-  Medicated and Controlled,  Endo/Other    Renal/GU      Musculoskeletal   Abdominal   Peds  Hematology   Anesthesia Other Findings   Reproductive/Obstetrics                          Anesthesia Physical Anesthesia Plan  ASA: II  Anesthesia Plan: General   Post-op Pain Management:    Induction: Intravenous  Airway Management Planned: Oral ETT  Additional Equipment:   Intra-op Plan:   Post-operative Plan: Extubation in OR  Informed Consent: I have reviewed the patients History and Physical, chart, labs and discussed the procedure including the risks, benefits and alternatives for the proposed anesthesia with the patient or authorized representative who has indicated his/her understanding and acceptance.   Dental advisory given  Plan Discussed with: Anesthesiologist, CRNA and Surgeon  Anesthesia Plan Comments:         Anesthesia Quick Evaluation

## 2011-10-01 NOTE — Interval H&P Note (Signed)
History and Physical Interval Note:  10/01/2011 7:29 AM  Jeanne Schwartz  has presented today for surgery, with the diagnosis of C5-6, C6-7 Spondylosis  The various methods of treatment have been discussed with the patient and family. After consideration of risks, benefits and other options for treatment, the patient has consented to  Procedure(s): ANTERIOR CERVICAL DECOMPRESSION/DISCECTOMY FUSION 2 LEVELS as a surgical intervention .  The patients' history has been reviewed, patient examined, no change in status, stable for surgery.  I have reviewed the patients' chart and labs.  Questions were answered to the patient's satisfaction.     [name]  No change in H and PE. Pt. Seen and discussed procedure. Has horsness times 2 yrs , she understands risks of procedure. Ancef ordered, Hx of PCN allergy noted.

## 2011-10-01 NOTE — Anesthesia Procedure Notes (Addendum)
Procedure Name: Intubation Date/Time: 10/01/2011 7:47 AM Performed by: Malachi Pro Pre-anesthesia Checklist: Patient identified, Emergency Drugs available, Suction available, Patient being monitored and Timeout performed Patient Re-evaluated:Patient Re-evaluated prior to inductionOxygen Delivery Method: Circle System Utilized Preoxygenation: Pre-oxygenation with 100% oxygen Intubation Type: IV induction Ventilation: Mask ventilation without difficulty Laryngoscope Size: Mac and 3 Tube type: Oral Tube size: 7.5 mm Number of attempts: 1 Airway Equipment and Method: stylet Placement Confirmation: ETT inserted through vocal cords under direct vision,  positive ETCO2 and breath sounds checked- equal and bilateral Secured at: 21 cm Tube secured with: Tape Dental Injury: Teeth and Oropharynx as per pre-operative assessment

## 2011-10-01 NOTE — Transfer of Care (Signed)
Immediate Anesthesia Transfer of Care Note  Patient: Jeanne Schwartz  Procedure(s) Performed:  ANTERIOR CERVICAL DECOMPRESSION/DISCECTOMY FUSION 2 LEVELS - C5-6, C6-7 Anterior Cervical Discectomy and Fusion, Allograft and Plate  Patient Location: PACU  Anesthesia Type: General  Level of Consciousness: awake, oriented and patient cooperative  Airway & Oxygen Therapy: Patient Spontanous Breathing and Patient connected to nasal cannula oxygen  Post-op Assessment: Report given to PACU RN, Post -op Vital signs reviewed and stable and Patient moving all extremities X 4  Post vital signs: Reviewed and stable  Complications: No apparent anesthesia complications

## 2011-10-01 NOTE — Anesthesia Postprocedure Evaluation (Signed)
  Anesthesia Post-op Note  Patient: Jeanne Schwartz  Procedure(s) Performed:  ANTERIOR CERVICAL DECOMPRESSION/DISCECTOMY FUSION 2 LEVELS - C5-6, C6-7 Anterior Cervical Discectomy and Fusion, Allograft and Plate  Patient Location: PACU  Anesthesia Type: General  Level of Consciousness: awake, alert  and oriented  Airway and Oxygen Therapy: Patient Spontanous Breathing and Patient connected to nasal cannula oxygen  Post-op Pain: mild  Post-op Assessment: Post-op Vital signs reviewed, Patient's Cardiovascular Status Stable, PATIENT'S CARDIOVASCULAR STATUS UNSTABLE, Patent Airway, No signs of Nausea or vomiting and Pain level controlled  Post-op Vital Signs: Reviewed and stable  Complications: No apparent anesthesia complications

## 2011-10-01 NOTE — Preoperative (Signed)
Beta Blockers   Reason not to administer Beta Blockers:Not Applicable 

## 2011-10-01 NOTE — Progress Notes (Signed)
Pt now reports shortness of breath with PCN allergy. Paged Dr. Ophelia Charter for order regarding antibiotics. Sent pt over to holding and notified Chical, Scientist, clinical (histocompatibility and immunogenetics).

## 2011-10-01 NOTE — Progress Notes (Signed)
Subjective: Day of Surgery Procedure(s) (LRB): ANTERIOR CERVICAL DECOMPRESSION/DISCECTOMY FUSION 2 LEVELS (N/A) Patient reports pain as mild.    Objective: Vital signs in last 24 hours: Temp:  [97.4 F (36.3 C)-98 F (36.7 C)] 97.5 F (36.4 C) (12/21 1510) Pulse Rate:  [63-105] 100  (12/21 1510) Resp:  [8-25] 16  (12/21 1510) BP: (114-148)/(60-95) 128/83 mmHg (12/21 1510) SpO2:  [90 %-100 %] 100 % (12/21 1510)  Intake/Output from previous day:   Intake/Output this shift: Total I/O In: 1940 [P.O.:240; I.V.:1700] Out: 3 [Drains:3]  No results found for this basename: HGB:5 in the last 72 hours No results found for this basename: WBC:2,RBC:2,HCT:2,PLT:2 in the last 72 hours No results found for this basename: NA:2,K:2,CL:2,CO2:2,BUN:2,CREATININE:2,GLUCOSE:2,CALCIUM:2 in the last 72 hours No results found for this basename: LABPT:2,INR:2 in the last 72 hours  Neurologically intact DTR's 2plus, biceps triceps grip 5/5. Notes improvement with relief of pre-op pain  Assessment/Plan: Day of Surgery Procedure(s) (LRB): ANTERIOR CERVICAL DECOMPRESSION/DISCECTOMY FUSION 2 LEVELS (N/A) Plan for discharge tomorrow.     Discussed with patient , husband and daughter with MRI pre-op and intra-op images and post op images in the room at the bedside. . Patient had multilevel changes with disc desication and worst level C 5-6 which was done first.  Had discussed 2 vs . 3 level fusion pre-op  Foraminal narrowing at C4-5 and broad based disc bulge at C6-7 .  C4-5 and C5-6 were fused,  With C5-6 being worse level. Discussed options, reoperation,etc.   I had given the patient's husband  a copy of fluro picture from OR after surgery completed  and discussed changes found at C4-5.   Patient notes improvement in preop pain she was having most likely from worst level which was C5-6. Questions were answered.   She has been up ambulating to BR. Comfortable.  Likely Discharge in AM.    Larnce Schnackenberg  C 10/01/2011, 5:40 PM

## 2011-10-02 DIAGNOSIS — M75101 Unspecified rotator cuff tear or rupture of right shoulder, not specified as traumatic: Secondary | ICD-10-CM | POA: Diagnosis present

## 2011-10-02 DIAGNOSIS — M47812 Spondylosis without myelopathy or radiculopathy, cervical region: Secondary | ICD-10-CM | POA: Diagnosis present

## 2011-10-02 MED ORDER — METHOCARBAMOL 500 MG PO TABS: 500.0000 mg | ORAL_TABLET | Freq: Four times a day (QID) | ORAL | Status: AC | PRN

## 2011-10-02 MED ORDER — OXYCODONE-ACETAMINOPHEN 5-325 MG PO TABS: 1.0000 | ORAL_TABLET | ORAL | Status: AC | PRN

## 2011-10-02 NOTE — Op Note (Signed)
Jeanne Schwartz, Jeanne Schwartz               ACCOUNT NO.:  [id number]  MEDICAL RECORD NO.:  [id number]  LOCATION:  5037                         FACILITY:  [facility]  PHYSICIAN:  Calven Gilkes C. Ophelia Charter, M.D.    DATE OF BIRTH:  1946/10/03  DATE OF PROCEDURE:  10/01/2011 DATE OF DISCHARGE:                              OPERATIVE REPORT   PREOPERATIVE DIAGNOSIS:  Cervical spondylosis, C4-5, C5-6  POSTOPERATIVE DIAGNOSIS:  Cervical spondylosis, C4-5 , C5-6  PROCEDURE:  C4-5, C5-6 anterior cervical diskectomy and fusion, allograft and plate.  SURGEON:  Sherrin Stahle C. Ophelia Charter, MD  ASSISTANT:  Wende Neighbors, PA-C, medically necessary and present for the entire procedure.  ESTIMATED BLOOD LOSS:  Less than 100 mL.  DRAINS:  One Hemovac neck.  COMPLICATIONS:  None.  This patient has persistent problems with cervical spondylosis with the symptoms progressive greater than 2 years with neck pain, failed to respond to Medrol Dosepak, physical therapy, anti-inflammatories, muscle relaxants, narcotic medication with biforaminal compression, worse on the right than left corresponding with her symptoms. MRI showed right full thickness RC tear. MRI cervical ,  Multilevel spondylosis, worst level C5-6 . Discussed single vs multilevel fusion with patient and husband pre-op. Disc desication, spondylosis C4-5, C5-6 , C6-7 . 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 2 vs 3 level fusion discussed with pt and husband pre-op , pseudarthrosis, re-operation discussed.      PROCEDURE IN DETAIL:  After induction of general anesthesia, Ancef prophylaxis, calf pumps, orotracheal intubation, arms stuck to the side, head halter traction applied, horseshoe head holder used, standard prepping with DuraPrep, sterile skin marker on prominent skin fold for spine in the area of the planned incision based on palpable landmarks, Betadine, Steri-Drape, sterile Mayo stand at the head, and thyroid sheet and draped.  Time-out procedure was completed.   Incision was made at the midline extending to the left.  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  There is a very large vein, which was medial to the carotid sheath.  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 Blunt dissection above the omohyoid was performed.  She had a rather long neck.  Needle localization, cross-table C-arm that was draped brought in demonstrated the needle was at C6-7, next level at C5-6, first planned operative level.This was her worst level.  Incision was made anteriorly in the anulus with a scalpel and disk material was removed.  Cloward retractors, teeth blades were then left in place with blades up and down.  Diskectomy was performed.  Spurs removed anteriorly.  Operative microscope was draped, brought in, and posterior longitudinal ligament was exposed after gradually progressing using combination of bur, hand rasp, curettes, Carlen and Cloward curettes.  There were overhanging spurs posteriorly.  Once spurs removed, the dura was completely decompressed.  Width of the diskectomy was wider than the trial sizers and uncovertebral joints were stripped.  There was some bleeding. Gelfoam was placed.  Endplates were rasped.  Trial sizers showed 60 and gave a nice fit.  Corticocancellous allograft marked anteriorly and packed with CRNA applying traction to the head halter and countersunk 2 mm.  Identical procedure was repeated at the 4-5 level.  At this level, there  was a very large protrusion of disk and some of the ligament had infolding.  There was more mobility at 4-5 level and 5-6 and chunks of disks were removed.  Particularly prone on the right side, uncovertebral joints were opened up and through the foramina with a 2-mm Cloward curette.  At this level, a 7-mm graft was placed based on sizers after endplate preparation, initially inserted.  It was slightly rotated. Traction was again applied by the CRNA.  It was rotated back in  good position, countersunk 2 mm, traction was relaxed and graft was tight, secure, and snug.  Anterior spurs removed, so the plate would sit down and a 26-mm plate was selected Biomet EBI view lock.  A 14-mm screws were placed.  After a single prong was placed, checked under AP and lateral fluoroscopy, adjusted, so was the midline and AP plane and all screws were in good position and lateral.  After irrigation with saline solution, operative field was dry.  There was some bleeding at the 4-5 level initially after placement of the graft, which occurred with the traction applied by the CRNA.  At this level also, there is some Gelfoam placed,  was due to some epidural veins and there was room on each side of the graft for egress and this subsided and stopped on its own by the time plate was selected, adjusted, and C-arms were checked before screws were placed.  Hemovac was placed in and out technique in line with the skin incision on the left.  Platysma closed with 3-0 Vicryl and 4-0 Vicryl subcuticular closure.  Tincture of benzoin, Steri-Strips, Marcaine infiltration, postop 4x4s,  tape, and soft cervical collar. Instrument count, needle count was correct.  The patient was transferred to the recovery room, hemodynamically stable.     Afreen Siebels C. Ophelia Charter, M.D.     MCY/MEDQ  D:  10/01/2011  T:  10/02/2011  Job:  [id number]

## 2011-10-02 NOTE — Progress Notes (Signed)
Orthopedic Tech Progress Note Patient Details:  Jeanne Schwartz [birth year] [id number]  Other Ortho Devices Type of Ortho Device: Other (comment) Ortho Device Location: soft collar   Gaye Pollack 10/02/2011, 11:08 AM

## 2011-10-02 NOTE — Discharge Summary (Signed)
Physician Discharge Summary  Patient ID: Jeanne Schwartz MRN: [id number] DOB/AGE: 12-Jul-1946 65 y.o.  Admit date: 10/01/2011 Discharge date: 10/02/2011  Admission Diagnoses:  Discharge Diagnoses:  Active Problems:  Cervical spondylosis  Rotator cuff tear, right   Discharged Condition: good ,  improved   Hospital Course: Under went cervical fusion with plating 2 level . Post op good relief of pain.    Consults: none  Significant Diagnostic Studies: labs: normal pre-op labs  Treatments: cervical fusion with plating. 2 level.  Discharge Exam: Blood pressure 119/73, pulse 71, temperature 97.9 F (36.6 C), temperature source Oral, resp. rate 16, SpO2 97.00%. Neurologic: Alert and oriented X 3, normal strength and tone. Normal symmetric reflexes. Normal coordination and gait  Disposition: Final discharge disposition not confirmed  Discharge Orders    Future Appointments: Provider: Department: Dept Phone: Center:   11/17/2011 1:30 PM [facility] [phone]     Future Orders Please Complete By Expires   Care order/instruction      Scheduling Instructions:   OK to shower with 2nd collar. Wrap in cellophane.   ROV in 1 wk Dr. Ophelia Charter   Care order/instruction      Scheduling Instructions:   Soft cervical collar.   ( brought by Milon Dikes , patient has.)  Please place order.     Medication List  As of 10/02/2011  9:12 AM   START taking these medications         methocarbamol 500 MG tablet   Commonly known as: ROBAXIN   Take 1 tablet (500 mg total) by mouth every 6 (six) hours as needed.      oxyCODONE-acetaminophen 5-325 MG per tablet   Commonly known as: PERCOCET   Take 1-2 tablets by mouth every 4 (four) hours as needed.         CONTINUE taking these medications         alendronate 70 MG tablet   Commonly known as: FOSAMAX      aspirin 81 MG chewable tablet      atorvastatin 20 MG tablet   Commonly known as: LIPITOR      cholecalciferol 1000 UNITS  tablet   Commonly known as: VITAMIN D      estradiol 0.05 MG/24HR   Commonly known as: VIVELLE-DOT      nitrofurantoin 50 MG capsule   Commonly known as: MACRODANTIN      omeprazole 20 MG capsule   Commonly known as: PRILOSEC      OVER THE COUNTER MEDICATION      zolpidem 10 MG tablet   Commonly known as: AMBIEN          Where to get your medications    These are the prescriptions that you need to pick up.   You may get these medications from any pharmacy.         methocarbamol 500 MG tablet   oxyCODONE-acetaminophen 5-325 MG per tablet             Signed: Ronica Vivian C 10/02/2011, 9:12 AM

## 2011-10-07 ENCOUNTER — Encounter (HOSPITAL_COMMUNITY): Payer: Self-pay | Admitting: Orthopaedic Surgery

## 2011-10-07 ENCOUNTER — Ambulatory Visit (HOSPITAL_COMMUNITY): Payer: BC Managed Care – PPO

## 2011-11-17 ENCOUNTER — Ambulatory Visit (HOSPITAL_COMMUNITY)
Admission: RE | Admit: 2011-11-17 | Discharge: 2011-11-17 | Disposition: A | Discharge status: Home / Self Care | Payer: BC Managed Care – PPO | Source: Ambulatory Visit | Attending: Family Medicine | Admitting: Family Medicine

## 2011-11-17 DIAGNOSIS — Z1231 Encounter for screening mammogram for malignant neoplasm of breast: Secondary | ICD-10-CM

## 2012-01-26 ENCOUNTER — Other Ambulatory Visit: Payer: Self-pay | Admitting: Family Medicine

## 2012-01-26 ENCOUNTER — Ambulatory Visit (HOSPITAL_COMMUNITY)
Admission: RE | Admit: 2012-01-26 | Discharge: 2012-01-26 | Disposition: A | Discharge status: Home / Self Care | Payer: Medicare Other | Source: Ambulatory Visit | Attending: Family Medicine | Admitting: Family Medicine

## 2012-01-26 DIAGNOSIS — M79609 Pain in unspecified limb: Secondary | ICD-10-CM

## 2012-01-26 DIAGNOSIS — R609 Edema, unspecified: Secondary | ICD-10-CM

## 2012-01-26 DIAGNOSIS — R52 Pain, unspecified: Secondary | ICD-10-CM

## 2012-01-26 NOTE — Progress Notes (Signed)
*  PRELIMINARY RESULTS* Vascular Ultrasound Lower extremity venous duplex has been completed.  No evidence of left common femoral vein or right lower extremity deep vein thrombosis. All veins compressible.  Malachy Moan, RDMS, RDCS 01/26/2012, 1:32 PM

## 2012-05-30 ENCOUNTER — Other Ambulatory Visit: Payer: Self-pay | Admitting: Gynecology

## 2012-09-14 ENCOUNTER — Encounter (HOSPITAL_COMMUNITY): Payer: Self-pay | Admitting: Emergency Medicine

## 2012-09-14 ENCOUNTER — Emergency Department (HOSPITAL_COMMUNITY)
Admission: EM | Admit: 2012-09-14 | Discharge: 2012-09-14 | Disposition: A | Discharge status: Home / Self Care | Payer: Medicare Other | Attending: Emergency Medicine | Admitting: Emergency Medicine

## 2012-09-14 ENCOUNTER — Other Ambulatory Visit: Payer: Self-pay

## 2012-09-14 ENCOUNTER — Emergency Department (HOSPITAL_COMMUNITY): Payer: Medicare Other

## 2012-09-14 DIAGNOSIS — E7800 Pure hypercholesterolemia, unspecified: Secondary | ICD-10-CM | POA: Insufficient documentation

## 2012-09-14 DIAGNOSIS — R109 Unspecified abdominal pain: Secondary | ICD-10-CM | POA: Insufficient documentation

## 2012-09-14 DIAGNOSIS — R0789 Other chest pain: Secondary | ICD-10-CM | POA: Insufficient documentation

## 2012-09-14 DIAGNOSIS — K2990 Gastroduodenitis, unspecified, without bleeding: Secondary | ICD-10-CM | POA: Insufficient documentation

## 2012-09-14 DIAGNOSIS — K219 Gastro-esophageal reflux disease without esophagitis: Secondary | ICD-10-CM | POA: Insufficient documentation

## 2012-09-14 DIAGNOSIS — R079 Chest pain, unspecified: Secondary | ICD-10-CM

## 2012-09-14 DIAGNOSIS — R55 Syncope and collapse: Secondary | ICD-10-CM | POA: Insufficient documentation

## 2012-09-14 DIAGNOSIS — Z79899 Other long term (current) drug therapy: Secondary | ICD-10-CM | POA: Insufficient documentation

## 2012-09-14 DIAGNOSIS — R0602 Shortness of breath: Secondary | ICD-10-CM | POA: Insufficient documentation

## 2012-09-14 DIAGNOSIS — K2970 Gastritis, unspecified, without bleeding: Secondary | ICD-10-CM

## 2012-09-14 LAB — BASIC METABOLIC PANEL
CO2: 24 mEq/L (ref 19–32)
Calcium: 10 mg/dL (ref 8.4–10.5)
Chloride: 102 mEq/L (ref 96–112)
Glucose, Bld: 95 mg/dL (ref 70–99)
Potassium: 4 mEq/L (ref 3.5–5.1)
Sodium: 140 mEq/L (ref 135–145)

## 2012-09-14 LAB — HEPATIC FUNCTION PANEL
AST: 25 U/L (ref 0–37)
Albumin: 3.9 g/dL (ref 3.5–5.2)
Total Bilirubin: 0.6 mg/dL (ref 0.3–1.2)

## 2012-09-14 LAB — CBC
Hemoglobin: 13.4 g/dL (ref 12.0–15.0)
MCH: 29.3 pg (ref 26.0–34.0)
RBC: 4.58 MIL/uL (ref 3.87–5.11)
WBC: 5.8 10*3/uL (ref 4.0–10.5)

## 2012-09-14 LAB — LIPASE, BLOOD: Lipase: 21 U/L (ref 11–59)

## 2012-09-14 LAB — POCT I-STAT TROPONIN I

## 2012-09-14 MED ORDER — ASPIRIN 81 MG PO CHEW
324.0000 mg | CHEWABLE_TABLET | Freq: Once | ORAL | Status: AC
  Administered 2012-09-14: 324 mg via ORAL
  Filled 2012-09-14: qty 4

## 2012-09-14 NOTE — ED Notes (Signed)
Pt reports she began having left sided chest pain last night, today with radiation of pain to back. Also c/o SOB, pt anxious but speaking in full sentences.

## 2012-09-14 NOTE — ED Provider Notes (Signed)
I saw and evaluated the patient, reviewed the resident's note and I agree with the findings and plan. I have reviewed EKG and agree with the resident interpretation.   Pt with atypical chest pain and history of chronic gastritis. She states the pain is gone now but when it did occur caused her to be short of breath. She states this was worked up approximately 2 years ago including a stress test, echo and EKG all which were within normal limits. She has no significant cardiac risk factors except for her hyperlipidemia. She is well-appearing. All her lab results and EKG are within normal limits. Will discharge patient home.  Gwyneth Sprout, MD 09/14/12 [phone]

## 2012-09-14 NOTE — ED Provider Notes (Signed)
History     CSN: [id number]  Arrival date & time 09/14/12  1151  First MD Initiated Contact with Patient 09/14/12 1704    Chief Complaint  Patient presents with  . Chest Pain   HPI: Jeanne Schwartz is a 66 yo CF with history of HLD and chronic gastritis who presents with upper abdominal and chest pain. Symptoms started last night with typical gastritis pain located in her upper abdomen, she was unable to qualify this pain just stated it "hurts," non-radiating, moderate, constant. Has been improving without intervention. A short time later she also developed left sided chest pain described as aching, radiating to her left arm and through to her back, moderate intensity, no alleviating or exacerbating factors. Pain has been improving as well. Associated with shortness of breath which is typical of exacerbations of her gastritis. Has never had chest pain like this before. This afternoon she also had a near syncopal episode when standing from a chair, which again is atypical for her.  Past Medical History  Diagnosis Date  . PONV (postoperative nausea and vomiting)     NOTHING IN THE LAST COUPLE YRS  . Headache     SINCE NECK  THING  . GERD (gastroesophageal reflux disease)     TAKES PRILOSEC  . High cholesterol     TAKES LIPITOR  . Rotator cuff tear, right     JUST FOUND OUT THIS WEEK    Past Surgical History  Procedure Date  . Left wrist     METAL PLATE   . Breast surgery     10 YRS  BENIGN  . Abdominal hysterectomy   . Fracture surgery     LEFT  WRIST  . Tubal ligation   . Blepharoplasty     2001  . Anterior cervical decomp/discectomy fusion 10/01/2011    Procedure: ANTERIOR CERVICAL DECOMPRESSION/DISCECTOMY FUSION 2 LEVELS;  Surgeon: Eldred Manges;  Location: [facility] OR;  Service: Orthopedics;  Laterality: N/A;  C5-6, C6-7 Anterior Cervical Discectomy and Fusion, Allograft and Plate    History reviewed. No pertinent family history.  History  Substance Use Topics  . Smoking status:  Never Smoker   . Smokeless tobacco: Not on file  . Alcohol Use: No    OB History    Grav Para Term Preterm Abortions TAB SAB Ect Mult Living                  Review of Systems  Constitutional: Positive for appetite change (decreased today). Negative for fever, chills and fatigue.  HENT: Negative for congestion and rhinorrhea.   Eyes: Negative for photophobia and visual disturbance.  Respiratory: Positive for shortness of breath. Negative for cough.   Cardiovascular: Positive for chest pain.  Gastrointestinal: Positive for abdominal pain. Negative for nausea, vomiting and diarrhea.  Genitourinary: Negative for dysuria, urgency and decreased urine volume.  Musculoskeletal: Negative for myalgias, back pain and arthralgias.  Skin: Negative for pallor and rash.  Neurological: Positive for syncope (near-syncopal). Negative for speech difficulty and headaches.  Psychiatric/Behavioral: Negative for confusion and agitation.    Allergies  Penicillins; Prochlorperazine edisylate; and Sulfonamide derivatives  Home Medications   Current Outpatient Rx  Name  Route  Sig  Dispense  Refill  . ATORVASTATIN CALCIUM 20 MG PO TABS   Oral   Take 10 mg by mouth daily.           Marland Kitchen VITAMIN D 1000 UNITS PO TABS   Oral   Take 1,000 Units by mouth  daily.           Marland Kitchen ESTRADIOL 0.05 MG/24HR TD PTTW   Transdermal   Place 0.5 patches onto the skin once a week. Saturdays         . OMEPRAZOLE 20 MG PO CPDR   Oral   Take 20 mg by mouth daily.           Marland Kitchen OVER THE COUNTER MEDICATION   Oral   Take 1 tablet by mouth daily. Adult Gummy Vitamins          . ZOLPIDEM TARTRATE 10 MG PO TABS   Oral   Take 5 mg by mouth at bedtime as needed. Insomnia            BP 149/82  Pulse 74  Temp 97.6 F (36.4 C) (Oral)  Resp 19  SpO2 100%  Physical Exam  Nursing note and vitals reviewed. Constitutional: She is oriented to person, place, and time. She appears well-developed and  well-nourished. She is cooperative. No distress.  HENT:  Head: Normocephalic and atraumatic.  Mouth/Throat: Oropharynx is clear and moist and mucous membranes are normal.  Eyes: Conjunctivae normal and EOM are normal. Pupils are equal, round, and reactive to light.  Neck: Neck supple. No JVD present.  Cardiovascular: Normal rate, regular rhythm, S1 normal, S2 normal and normal heart sounds.  PMI is not displaced.   Pulmonary/Chest: Effort normal and breath sounds normal. She has no decreased breath sounds.  Abdominal: Soft. Normal appearance. There is tenderness in the epigastric area. There is no rebound, no guarding and no CVA tenderness.  Musculoskeletal: Normal range of motion.  Neurological: She is alert and oriented to person, place, and time. She has normal strength and normal reflexes. No cranial nerve deficit or sensory deficit. GCS eye subscore is 4. GCS verbal subscore is 5. GCS motor subscore is 6.  Skin: Skin is warm and dry. No pallor.   ED Course  Procedures  Labs Reviewed  BASIC METABOLIC PANEL - Abnormal; Notable for the following:    GFR calc non Af Amer 56 (*)     GFR calc Af Amer 65 (*)     All other components within normal limits  CBC  POCT I-STAT TROPONIN I   Dg Chest 2 View  09/14/2012  *RADIOLOGY REPORT*  Clinical Data: Left-sided chest pain and epigastric pain.  CHEST - 2 VIEW  Comparison: 10/01/2011  Findings: Heart size and pulmonary vascularity are normal and the lungs are clear.  No osseous abnormality.  IMPRESSION: Normal chest.   Original Report Authenticated By: Francene Boyers, M.D.    1. Chest pain   2. Gastritis    MDM  66 yo CF with history of HLD and chronic gastritis who presents with upper abdominal and chest pain. Afebrile, vital signs stable. Symptoms c/w previous episodes of gastritis except for CP which is atypical. Initial ECG without ischemia or infarction. Initial troponin negative. Labs without significant abnormality. CXR without acute  cardiopulmonary findings. Concern for PE as pain in chest is constant. Moderate risk by Well's, therefore appropriate for r/o with D-dimer which was negative, doubt PE. Further she is not tachycardic or hypoxic. Second troponin negative as well, doubt ACS. Feel patient's near-syncopal episode was vasovagal, no high risk characteristics. Symptoms today likely exacerbation of chronic gastritis. Feel patient is appropriate for outpatient management as symptoms have resolved, no significant abnormality on work-up or physical exam, has close outpatient follow-up. Advised to see her PCP in 2-3 days for further  management. Return precautions given. Patient in agreement with plan.   Reviewed imaging, labs and previous medical records, utilized in MDM  Discussed case with Dr. Anitra Lauth  ECG: SR, rate 91, inverted T waves, no STE or depression. No previous ECG for comparison.        Clinical Impression 1. Chest pain 2. Gastritis 3. Near-syncope       Margie Billet, MD 09/14/12 2342

## 2012-09-14 NOTE — Discharge Instructions (Signed)
Your work-up today did not reveal and particular cause for your chest pain. It is important to follow-up with your physician in the next 1-2 days for further evaluation.   Chest Pain (Nonspecific) It is often hard to give a specific diagnosis for the cause of chest pain. There is always a chance that your pain could be related to something serious, such as a heart attack or a blood clot in the lungs. You need to follow up with your caregiver for further evaluation. CAUSES   Heartburn.  Pneumonia or bronchitis.  Anxiety or stress.  Inflammation around your heart (pericarditis) or lung (pleuritis or pleurisy).  A blood clot in the lung.  A collapsed lung (pneumothorax). It can develop suddenly on its own (spontaneous pneumothorax) or from injury (trauma) to the chest.  Shingles infection (herpes zoster virus). The chest wall is composed of bones, muscles, and cartilage. Any of these can be the source of the pain.  The bones can be bruised by injury.  The muscles or cartilage can be strained by coughing or overwork.  The cartilage can be affected by inflammation and become sore (costochondritis). DIAGNOSIS  Lab tests or other studies, such as X-rays, electrocardiography, stress testing, or cardiac imaging, may be needed to find the cause of your pain.  TREATMENT   Treatment depends on what may be causing your chest pain. Treatment may include:  Acid blockers for heartburn.  Anti-inflammatory medicine.  Pain medicine for inflammatory conditions.  Antibiotics if an infection is present.  You may be advised to change lifestyle habits. This includes stopping smoking and avoiding alcohol, caffeine, and chocolate.  You may be advised to keep your head raised (elevated) when sleeping. This reduces the chance of acid going backward from your stomach into your esophagus.  Most of the time, nonspecific chest pain will improve within 2 to 3 days with rest and mild pain medicine. HOME  CARE INSTRUCTIONS   If antibiotics were prescribed, take your antibiotics as directed. Finish them even if you start to feel better.  For the next few days, avoid physical activities that bring on chest pain. Continue physical activities as directed.  Do not smoke.  Avoid drinking alcohol.  Only take over-the-counter or prescription medicine for pain, discomfort, or fever as directed by your caregiver.  Follow your caregiver's suggestions for further testing if your chest pain does not go away.  Keep any follow-up appointments you made. If you do not go to an appointment, you could develop lasting (chronic) problems with pain. If there is any problem keeping an appointment, you must call to reschedule. SEEK MEDICAL CARE IF:   You think you are having problems from the medicine you are taking. Read your medicine instructions carefully.  Your chest pain does not go away, even after treatment.  You develop a rash with blisters on your chest. SEEK IMMEDIATE MEDICAL CARE IF:   You have increased chest pain or pain that spreads to your arm, neck, jaw, back, or abdomen.  You develop shortness of breath, an increasing cough, or you are coughing up blood.  You have severe back or abdominal pain, feel nauseous, or vomit.  You develop severe weakness, fainting, or chills.  You have a fever. THIS IS AN EMERGENCY. Do not wait to see if the pain will go away. Get medical help at once. Call your local emergency services (911 in [place] Do not drive yourself to the hospital. MAKE SURE YOU:   Understand these instructions.  Will watch  your condition.  Will get help right away if you are not doing well or get worse. Document Released: 07/07/2005 Document Revised: 12/20/2011 Document Reviewed: 05/02/2008 [facility] Patient Information 2013 [place], [place].

## 2012-09-26 ENCOUNTER — Other Ambulatory Visit: Payer: Self-pay | Admitting: Gastroenterology

## 2012-09-26 DIAGNOSIS — R079 Chest pain, unspecified: Secondary | ICD-10-CM

## 2012-09-26 DIAGNOSIS — K219 Gastro-esophageal reflux disease without esophagitis: Secondary | ICD-10-CM

## 2012-10-05 ENCOUNTER — Ambulatory Visit (HOSPITAL_COMMUNITY)
Admission: RE | Admit: 2012-10-05 | Discharge: 2012-10-05 | Disposition: A | Discharge status: Home / Self Care | Payer: Medicare Other | Source: Ambulatory Visit | Attending: Gastroenterology | Admitting: Gastroenterology

## 2012-10-05 ENCOUNTER — Encounter (HOSPITAL_COMMUNITY)
Admission: RE | Admit: 2012-10-05 | Discharge: 2012-10-05 | Disposition: A | Discharge status: Home / Self Care | Payer: Medicare Other | Source: Ambulatory Visit | Attending: Gastroenterology | Admitting: Gastroenterology

## 2012-10-05 DIAGNOSIS — K219 Gastro-esophageal reflux disease without esophagitis: Secondary | ICD-10-CM | POA: Insufficient documentation

## 2012-10-05 DIAGNOSIS — R079 Chest pain, unspecified: Secondary | ICD-10-CM | POA: Insufficient documentation

## 2012-10-05 DIAGNOSIS — Q619 Cystic kidney disease, unspecified: Secondary | ICD-10-CM | POA: Insufficient documentation

## 2012-10-05 IMAGING — NM NM HEPATO W/GB/PHARM/[PERSON_NAME]
2 series · 12 of 12 positions shown · non-contrast
Comparison: 10/05/2012 ultrasound.

CLINICAL DATA: Abdominal pain and chest pain.  Nausea.  Fatty food
intolerance.

NUCLEAR MEDICINE HEPATOBILIARY IMAGING WITH GALLBLADDER EF
TECHNIQUE: Sequential images of the abdomen were obtained [DATE] minutes following intravenous administration of
radiopharmaceutical.  After slow intravenous infusion of
micrograms Cholecystokinin, gallbladder ejection fraction was
determined.
Radiopharmaceutical:  5.2 mCi Zc-NNm Choletec

[Series 0: hepatobiliary · 3.20mm/px · 6 of 31 frames shown (1 of 2)]
[frame 3/31]
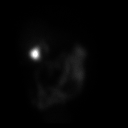
[frame 8/31]
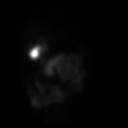
[frame 13/31]
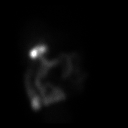
[frame 18/31]
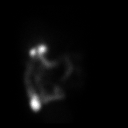
[frame 23/31]
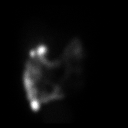
[frame 29/31]
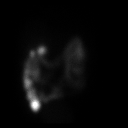

[Series 0: hepatobiliary · 3.20mm/px · 6 of 51 frames shown (2 of 2)]
[frame 5/51]
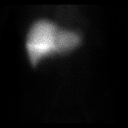
[frame 13/51]
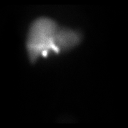
[frame 22/51]
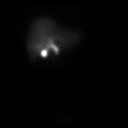
[frame 30/51]
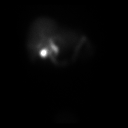
[frame 39/51]
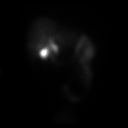
[frame 47/51]
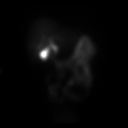

[12 of 12 positions shown; findings below may reference images not displayed]

FINDINGS: Homogeneous uptake by the liver.  Gallbladder visualized
by 10 minutes.  The small bowel visualized by 30 minutes.

After injection of CCK, calculated gallbladder ejection fraction of
88.8% which is within range normal limits (greater than or equal to
30%)

The patient did not experience  symptoms during CCK infusion.
IMPRESSION: Normal gallbladder ejection fraction.

## 2012-10-05 IMAGING — US US ABDOMEN COMPLETE
1 series · 14 of 25 positions shown · non-contrast
Comparison: None.

CLINICAL DATA: Chest pain, esophageal reflux

COMPLETE ABDOMINAL ULTRASOUND

[Series 1: us abdomen complete · 0.25mm/px · 14 of 85 slices shown]
[im 1/85]
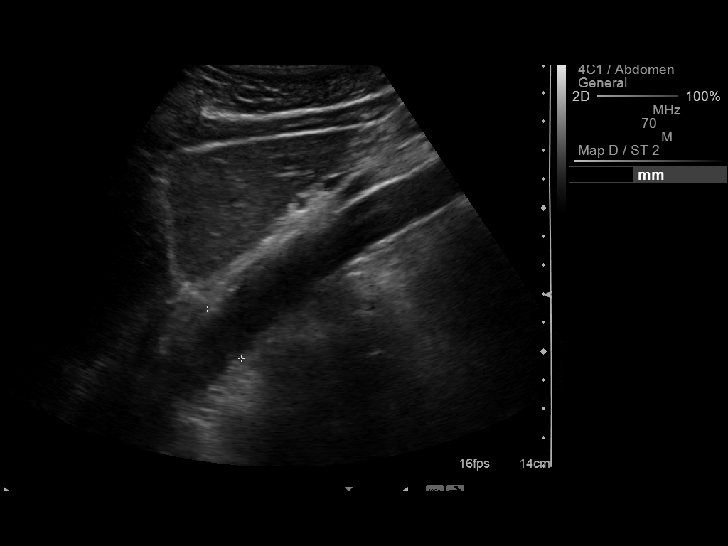
[im 8/85]
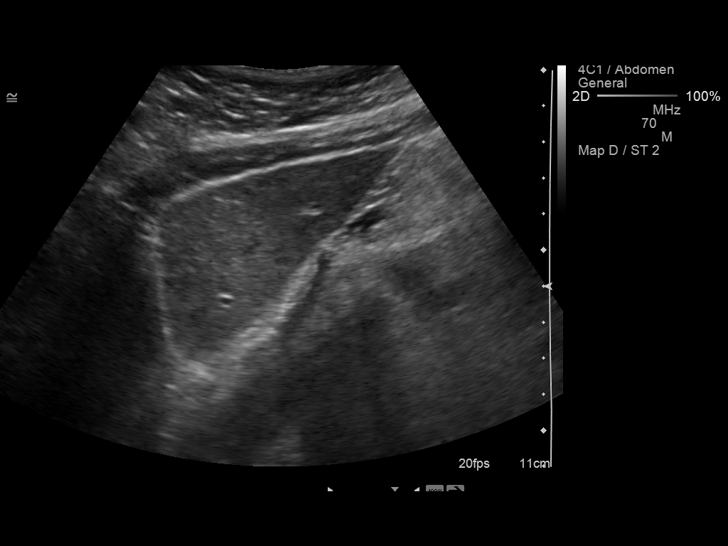
[im 15/85]
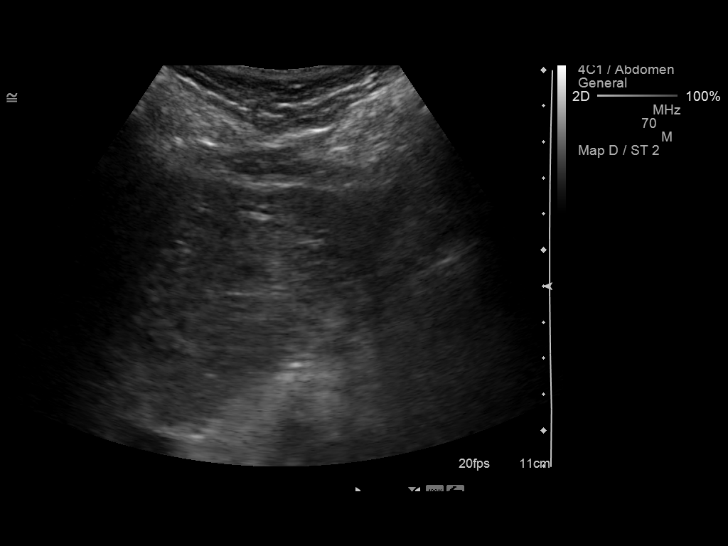
[im 22/85]
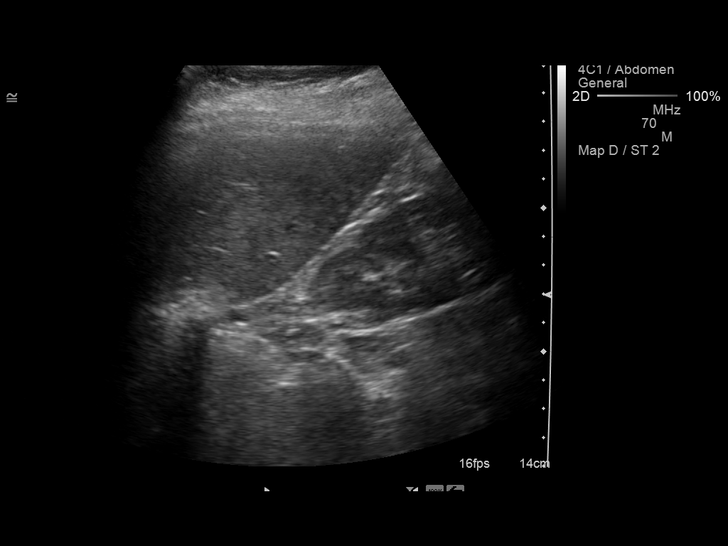
[im 29/85]
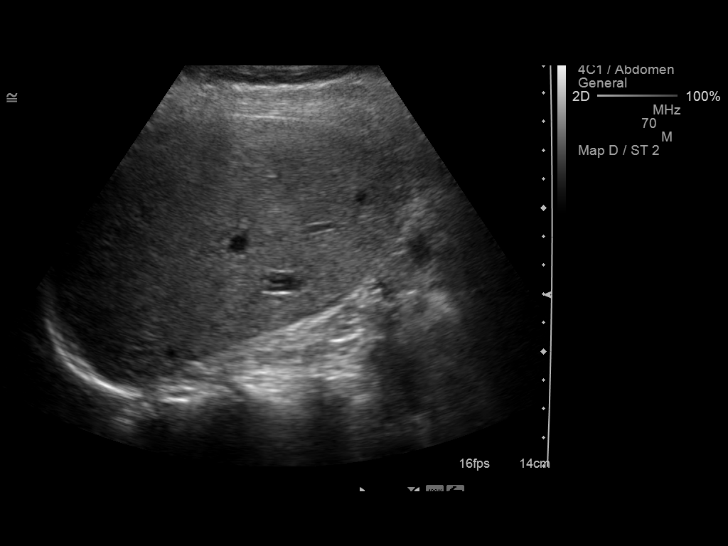
[im 32/85]
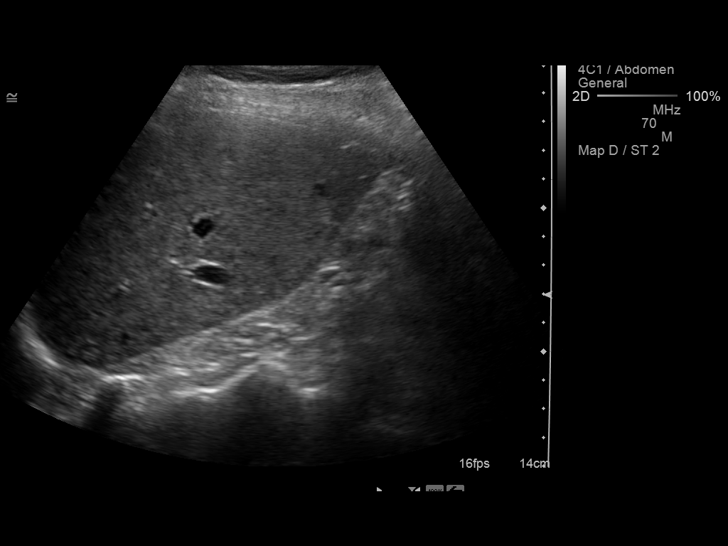
[im 39/85]
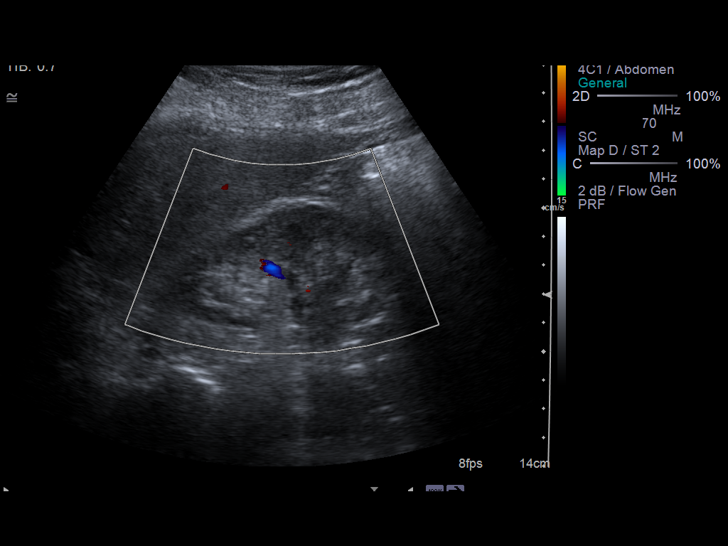
[im 46/85]
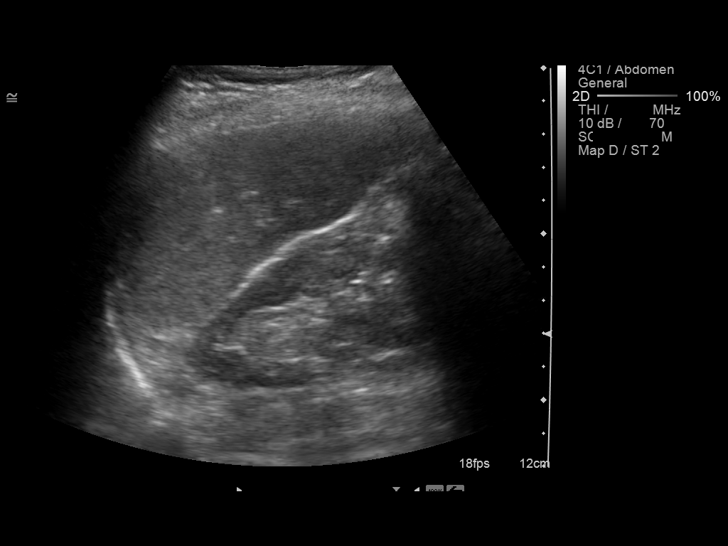
[im 53/85]
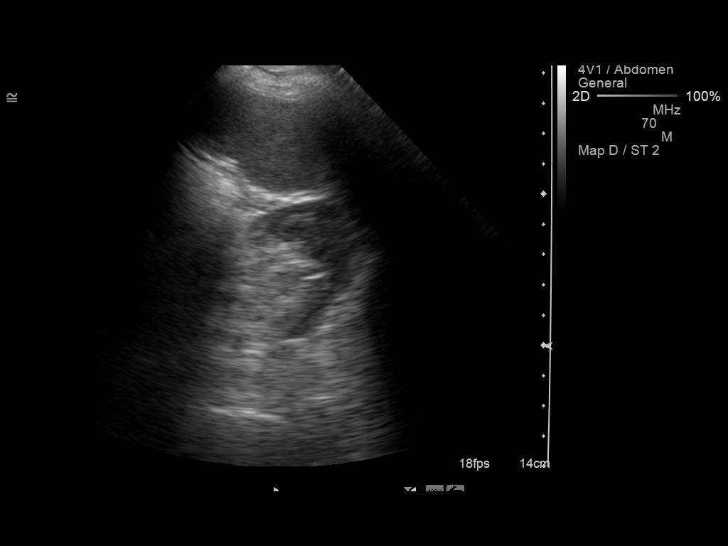
[im 57/85]
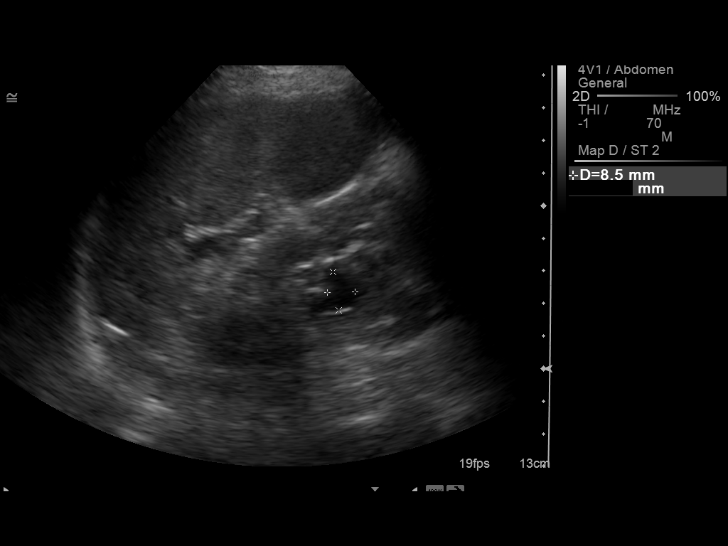
[im 64/85]
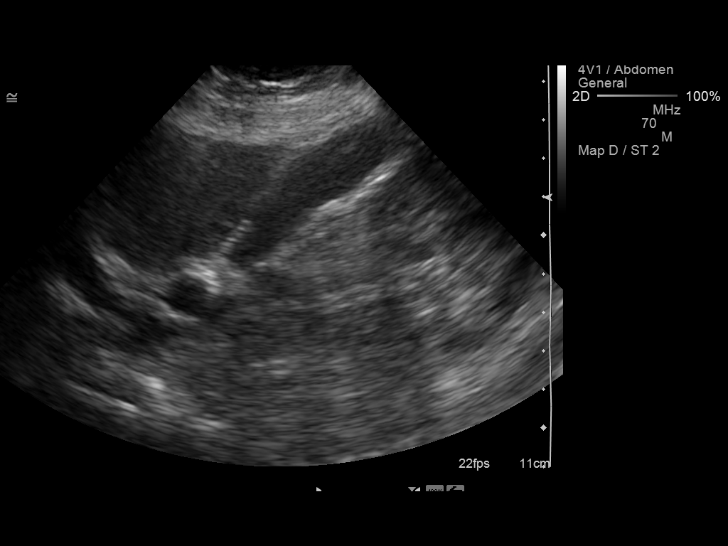
[im 71/85]
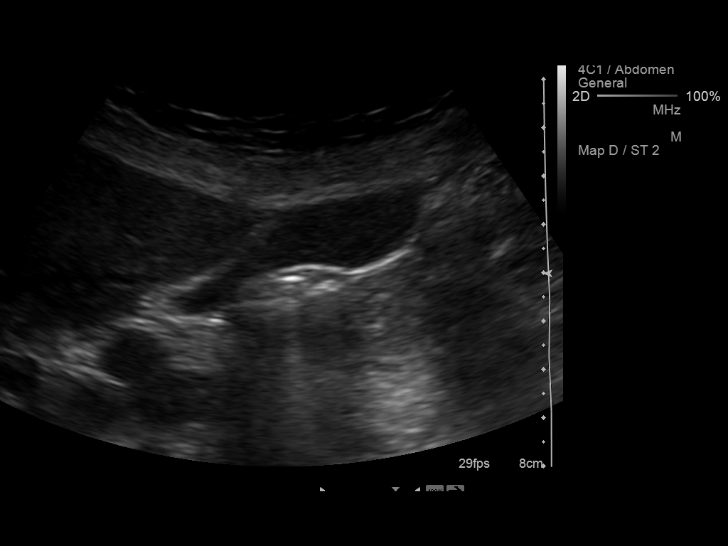
[im 78/85]
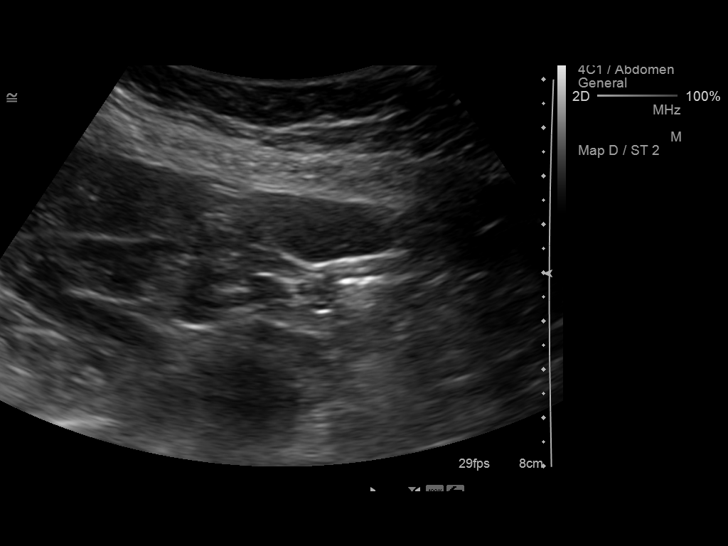
[im 85/85]
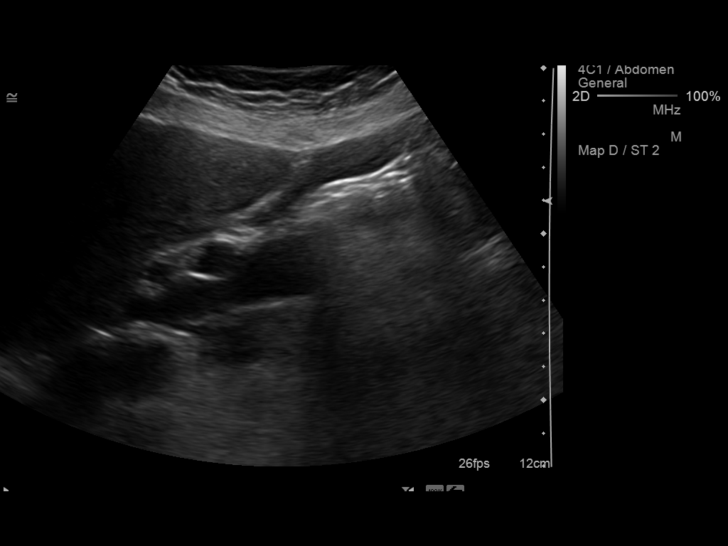

[14 of 25 positions shown; findings below may reference images not displayed]

FINDINGS: Gallbladder:  No gallstones, gallbladder wall thickening, or
pericholecystic fluid. No sonographic Murphy's sign.

Common bile duct:  Measures 1.5 mm in diameter within normal
limits.

Liver:  No focal lesion identified.  Within normal limits in
parenchymal echogenicity.

IVC:  Appears normal.

Pancreas:  No focal abnormality seen.

Spleen:  Measures 8.5 cm in length.  Normal echogenicity.

Right Kidney:  Measures 8.3 cm in length.  Cortical thinning is
noted probable due to atrophy.  No hydronephrosis or diagnostic
renal calculus.

Left Kidney:  Measures 9.6 cm in length.  Cortical thinning is
noted.  No hydronephrosis or diagnostic renal calculus.  A central
parapelvic cyst measures 1.4 x 1.2 cm.

Abdominal aorta:  No aneurysm identified. Measures 2.1 cm in
diameter.
IMPRESSION: 1.  No gallstones are noted within gallbladder.  Normal CBD.
2.  No hydronephrosis or diagnostic renal calculus.
3.  Left kidney parapelvic cyst measures 1.4 cm.

## 2012-10-05 MED ORDER — SINCALIDE 5 MCG IJ SOLR
INTRAMUSCULAR | Status: AC
  Administered 2012-10-05: 5 ug
  Filled 2012-10-05: qty 5

## 2012-10-05 MED ORDER — SINCALIDE 5 MCG IJ SOLR: 0.0200 ug/kg | Freq: Once | INTRAMUSCULAR | Status: DC

## 2012-10-05 MED ORDER — TECHNETIUM TC 99M MEBROFENIN IV KIT
5.0000 | PACK | Freq: Once | INTRAVENOUS | Status: AC | PRN
  Administered 2012-10-05: 5 via INTRAVENOUS

## 2012-10-24 ENCOUNTER — Other Ambulatory Visit (HOSPITAL_COMMUNITY): Payer: Self-pay | Admitting: Gynecology

## 2012-10-24 DIAGNOSIS — Z1231 Encounter for screening mammogram for malignant neoplasm of breast: Secondary | ICD-10-CM

## 2012-11-20 ENCOUNTER — Ambulatory Visit (HOSPITAL_COMMUNITY)
Admission: RE | Admit: 2012-11-20 | Discharge: 2012-11-20 | Disposition: A | Discharge status: Home / Self Care | Payer: Medicare Other | Source: Ambulatory Visit | Attending: Gynecology | Admitting: Gynecology

## 2012-11-20 DIAGNOSIS — Z1231 Encounter for screening mammogram for malignant neoplasm of breast: Secondary | ICD-10-CM | POA: Insufficient documentation

## 2012-11-20 IMAGING — MG MM DIGITAL SCREENING BILAT
4 series · 4 of 4 positions shown · non-contrast
Comparison: Previous exams.

CLINICAL DATA: Screening.

DIGITAL BILATERAL SCREENING MAMMOGRAM WITH CAD

[R CC]
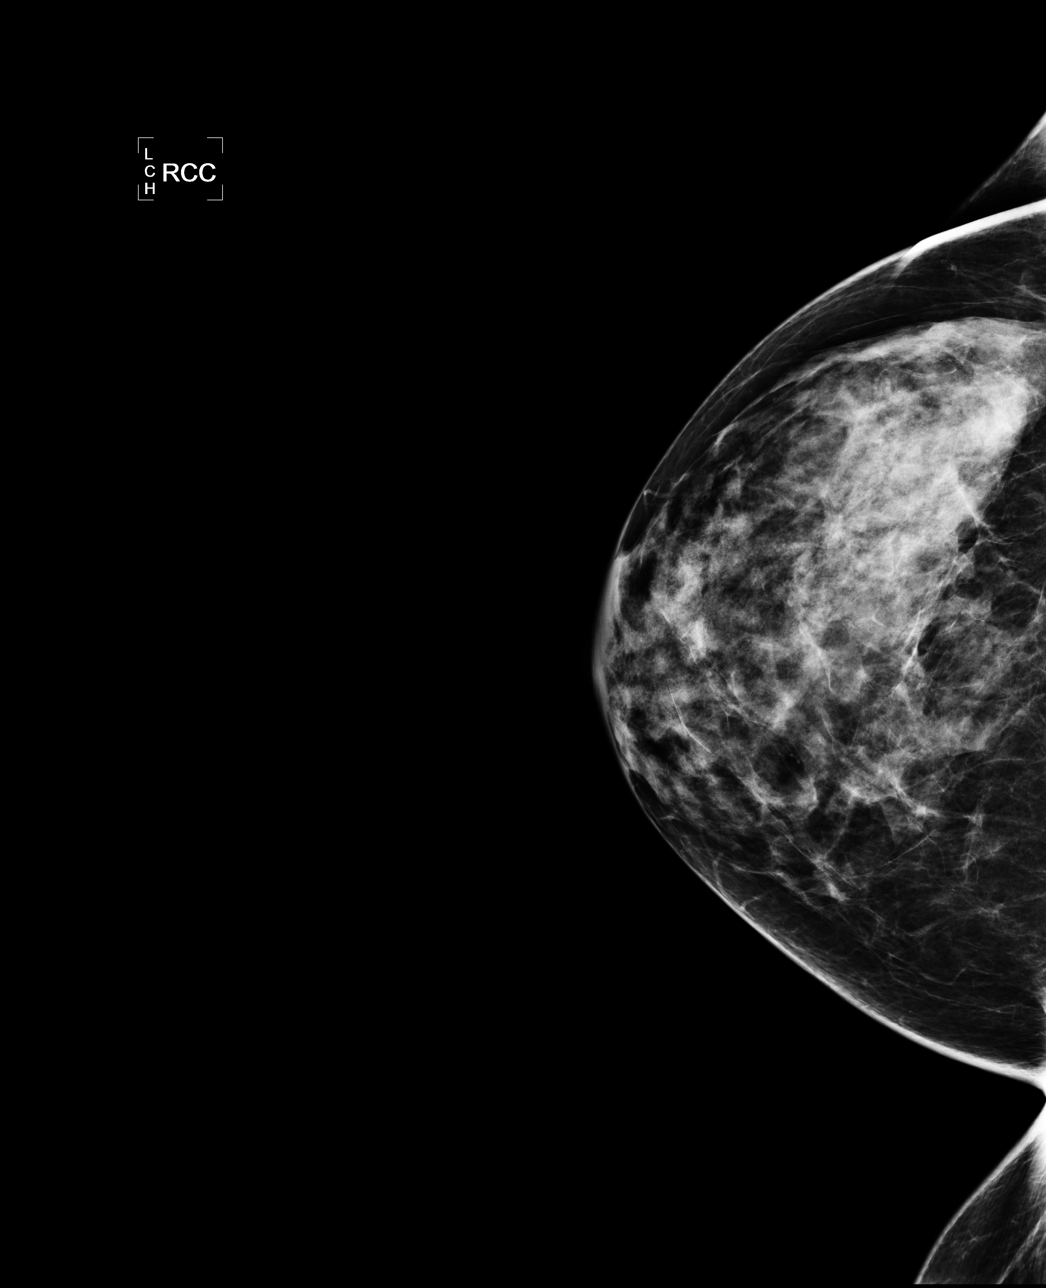

[R MLO]
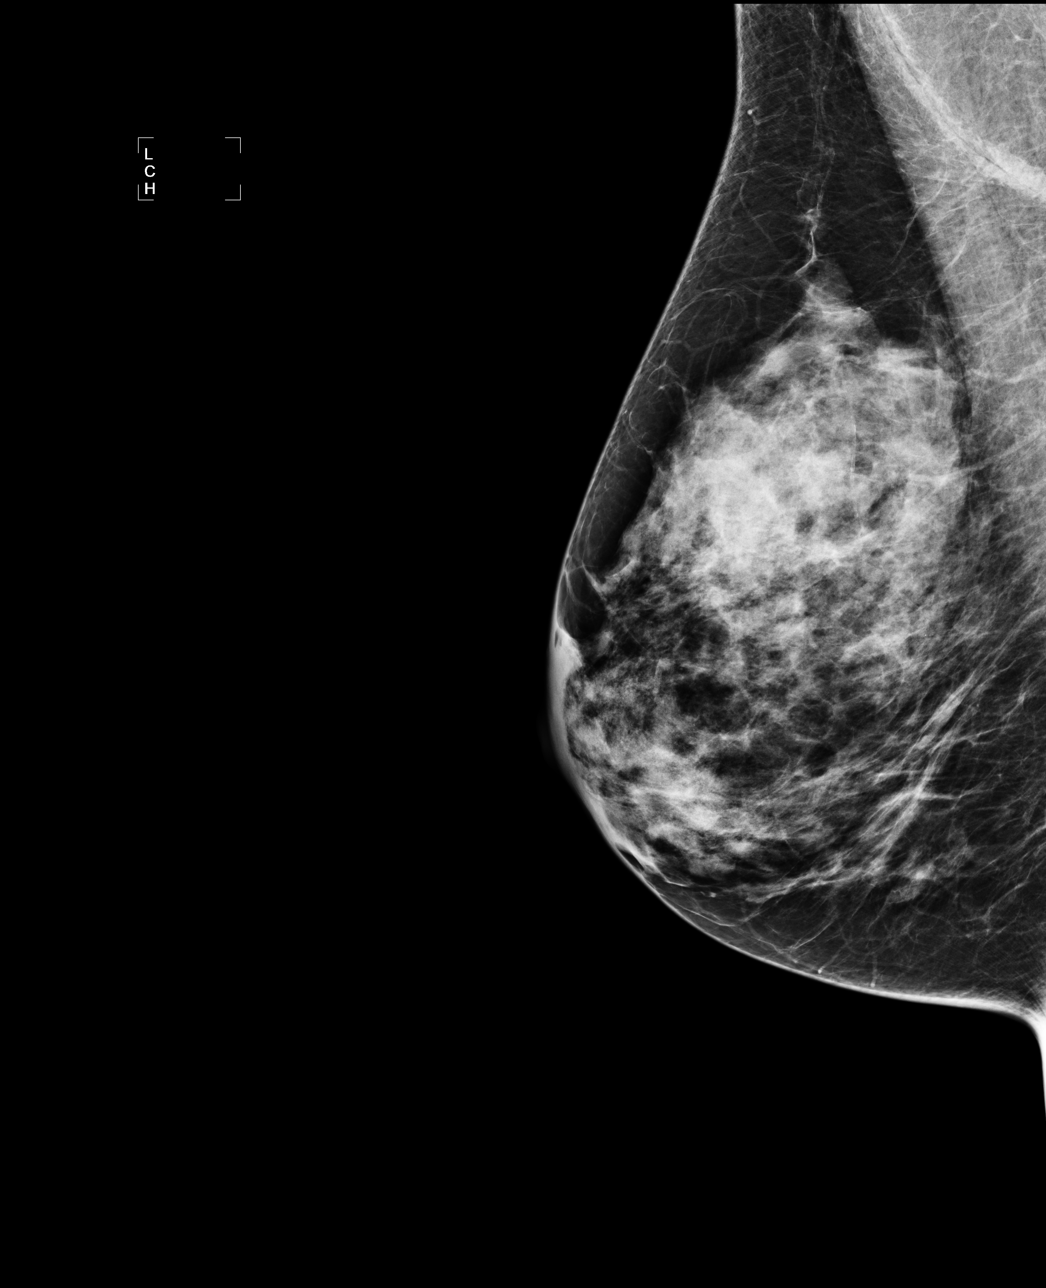

[L CC]
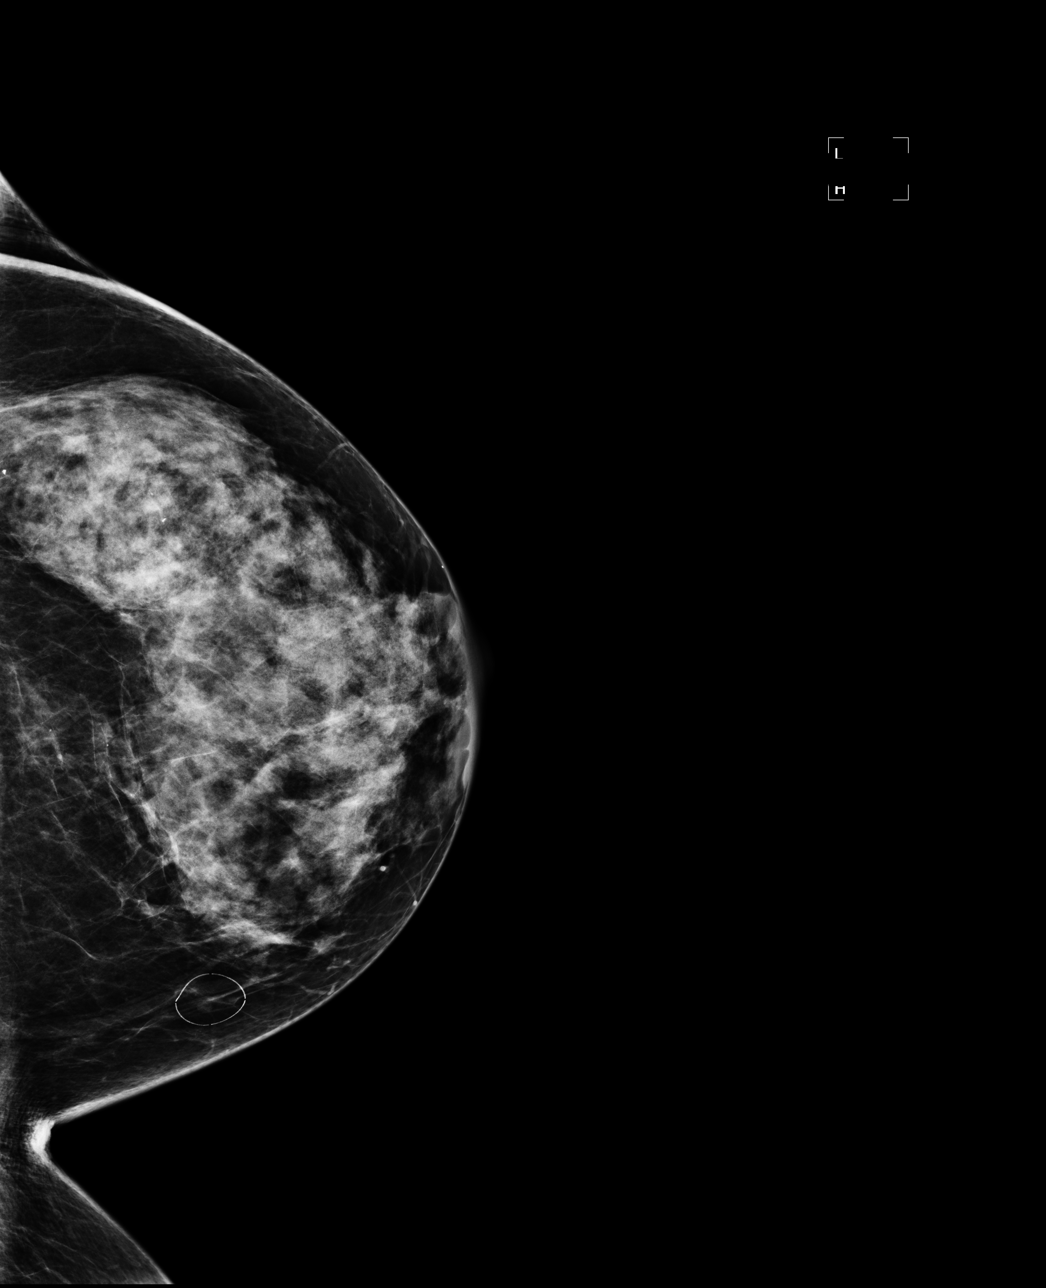

[L MLO]
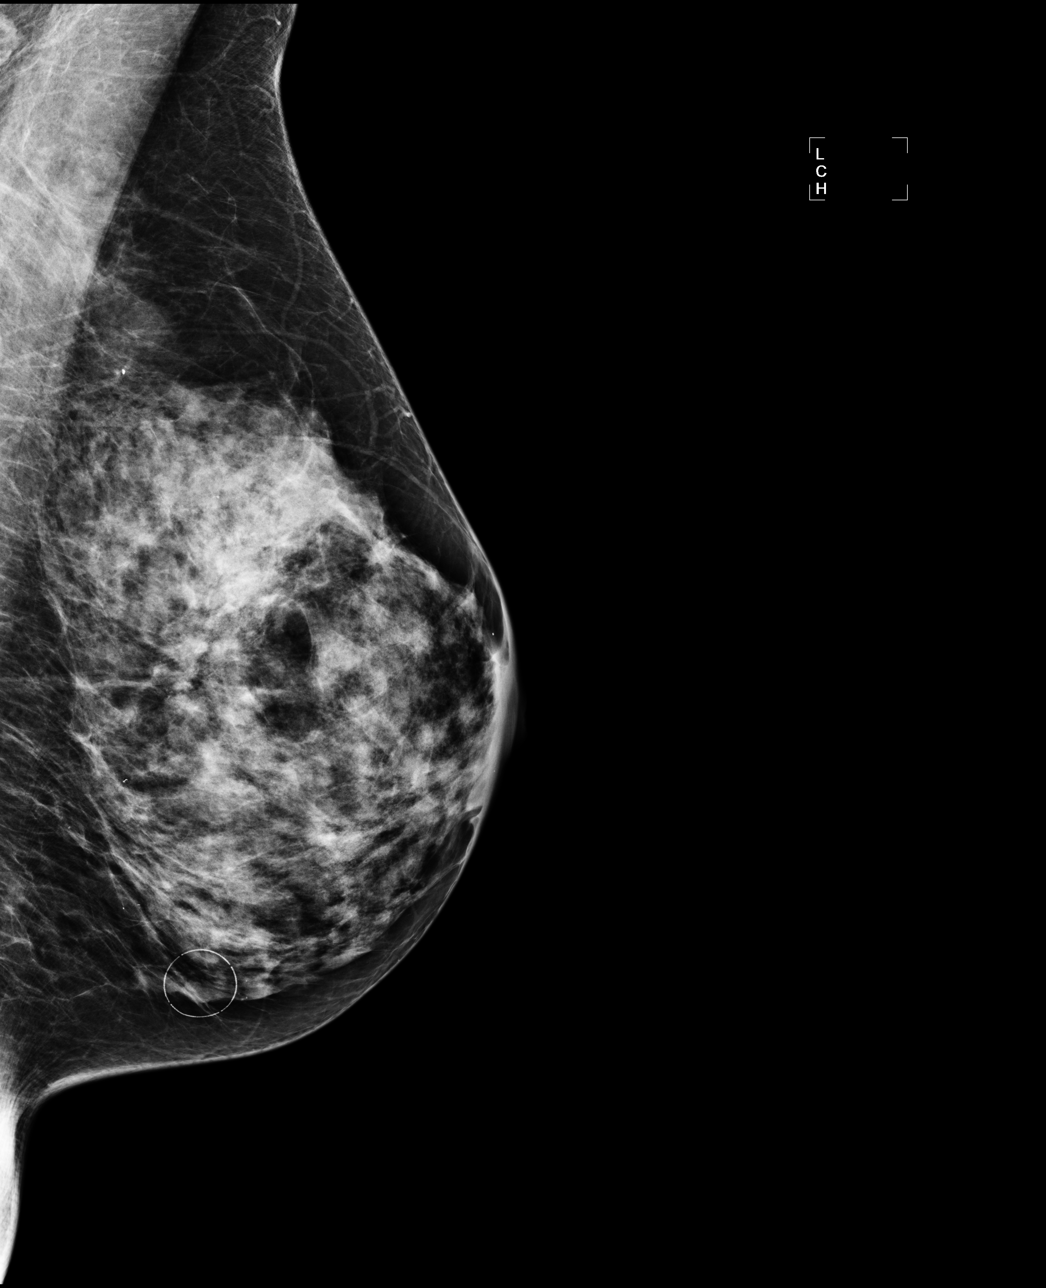

[4 of 4 positions shown; findings below may reference images not displayed]

FINDINGS: ACR Breast Density Category 3: The breast tissue is heterogeneously
dense.

In the right breast, possible distortion warrants further
evaluation with spot compression views and possibly ultrasound.  In
the left breast, no suspicious masses or malignant type
calcifications are identified.

Images were processed with CAD.
IMPRESSION: Further evaluation is suggested for distortion in the right breast.

RECOMMENDATION:

Diagnostic mammogram and possibly ultrasound of the right breast.
(Code:YI-[id number])

The patient will be contacted regarding the findings, and
additional imaging will be scheduled.

BI-RADS CATEGORY 0:  Incomplete.  Need additional imaging
evaluation and/or prior mammograms for comparison.

## 2012-11-21 ENCOUNTER — Other Ambulatory Visit: Payer: Self-pay | Admitting: Gynecology

## 2012-11-21 DIAGNOSIS — R928 Other abnormal and inconclusive findings on diagnostic imaging of breast: Secondary | ICD-10-CM

## 2012-11-25 ENCOUNTER — Other Ambulatory Visit: Payer: Self-pay

## 2012-12-01 ENCOUNTER — Ambulatory Visit
Admission: RE | Admit: 2012-12-01 | Discharge: 2012-12-01 | Disposition: A | Discharge status: Home / Self Care | Payer: Medicare Other | Source: Ambulatory Visit | Attending: Gynecology | Admitting: Gynecology

## 2012-12-01 DIAGNOSIS — R928 Other abnormal and inconclusive findings on diagnostic imaging of breast: Secondary | ICD-10-CM

## 2013-05-16 ENCOUNTER — Other Ambulatory Visit: Payer: Self-pay

## 2013-08-16 ENCOUNTER — Other Ambulatory Visit: Payer: Self-pay

## 2013-11-01 ENCOUNTER — Other Ambulatory Visit (HOSPITAL_COMMUNITY): Payer: Self-pay | Admitting: Gynecology

## 2013-11-01 DIAGNOSIS — Z1231 Encounter for screening mammogram for malignant neoplasm of breast: Secondary | ICD-10-CM

## 2013-12-06 ENCOUNTER — Ambulatory Visit (HOSPITAL_COMMUNITY): Payer: Medicare PPO

## 2014-01-30 ENCOUNTER — Other Ambulatory Visit (HOSPITAL_COMMUNITY): Payer: Self-pay | Admitting: Family Medicine

## 2014-01-30 DIAGNOSIS — M899 Disorder of bone, unspecified: Secondary | ICD-10-CM

## 2014-01-30 DIAGNOSIS — M949 Disorder of cartilage, unspecified: Principal | ICD-10-CM

## 2014-02-04 ENCOUNTER — Ambulatory Visit (HOSPITAL_COMMUNITY)
Admission: RE | Admit: 2014-02-04 | Discharge: 2014-02-04 | Disposition: A | Discharge status: Home / Self Care | Payer: Medicare PPO | Source: Ambulatory Visit | Attending: Family Medicine | Admitting: Family Medicine

## 2014-02-04 DIAGNOSIS — M949 Disorder of cartilage, unspecified: Principal | ICD-10-CM

## 2014-02-04 DIAGNOSIS — M899 Disorder of bone, unspecified: Secondary | ICD-10-CM

## 2014-09-02 ENCOUNTER — Encounter: Payer: Self-pay | Admitting: Gastroenterology

## 2015-04-07 ENCOUNTER — Other Ambulatory Visit: Payer: Self-pay

## 2016-05-29 DIAGNOSIS — R059 Cough, unspecified: Secondary | ICD-10-CM | POA: Insufficient documentation

## 2016-07-30 ENCOUNTER — Ambulatory Visit (INDEPENDENT_AMBULATORY_CARE_PROVIDER_SITE_OTHER): Payer: Medicare HMO | Admitting: Orthopaedic Surgery

## 2016-07-30 DIAGNOSIS — M25511 Pain in right shoulder: Secondary | ICD-10-CM | POA: Diagnosis not present

## 2016-08-02 ENCOUNTER — Other Ambulatory Visit (INDEPENDENT_AMBULATORY_CARE_PROVIDER_SITE_OTHER): Payer: Self-pay | Admitting: Orthopaedic Surgery

## 2016-08-02 DIAGNOSIS — Z8739 Personal history of other diseases of the musculoskeletal system and connective tissue: Secondary | ICD-10-CM

## 2016-08-02 DIAGNOSIS — M25511 Pain in right shoulder: Principal | ICD-10-CM

## 2016-08-02 DIAGNOSIS — G8929 Other chronic pain: Secondary | ICD-10-CM

## 2016-08-08 ENCOUNTER — Ambulatory Visit
Admission: RE | Admit: 2016-08-08 | Discharge: 2016-08-08 | Disposition: A | Discharge status: Home / Self Care | Payer: Medicare PPO | Source: Ambulatory Visit | Attending: Orthopaedic Surgery | Admitting: Orthopaedic Surgery

## 2016-08-08 DIAGNOSIS — Z8739 Personal history of other diseases of the musculoskeletal system and connective tissue: Secondary | ICD-10-CM

## 2016-08-08 DIAGNOSIS — M25511 Pain in right shoulder: Principal | ICD-10-CM

## 2016-08-08 DIAGNOSIS — G8929 Other chronic pain: Secondary | ICD-10-CM

## 2016-08-08 IMAGING — MR MR SHOULDER*R* W/O CM
4 of 5 series · 29 of 40 positions shown · non-contrast
Comparison: MRI dated 09/22/2011 and radiographs dated 06/30/2010

CLINICAL DATA: Right shoulder and upper arm pain for 4-5 years.

EXAM:
MRI OF THE RIGHT SHOULDER WITHOUT CONTRAST
TECHNIQUE: Multiplanar, multisequence MR imaging of the shoulder was performed.
No intravenous contrast was administered.

[Series 3: T2 fat-sat · axial · 4.0mm · 0.55mm/px · z∈[-52,+23]mm · 8 of 17 slices shown (1 of 3)]
[im 1/17]
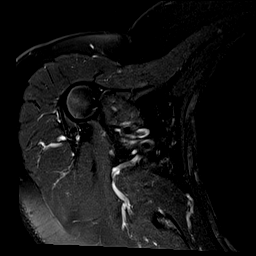
[im 3/17]
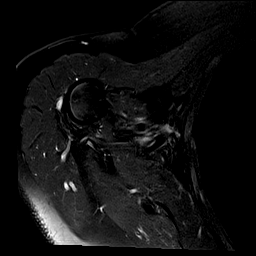
[im 5/17]
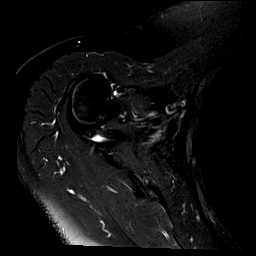
[im 7/17]
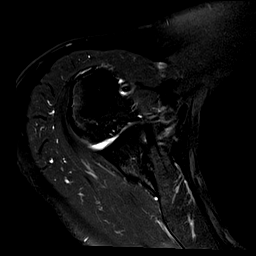
[im 10/17]
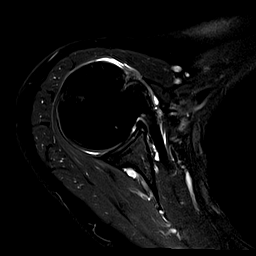
[im 12/17]
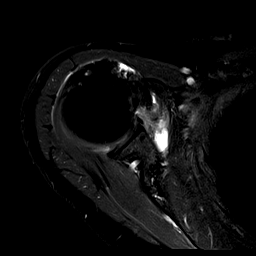
[im 14/17]
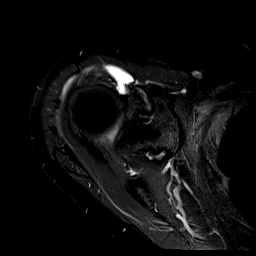
[im 17/17]
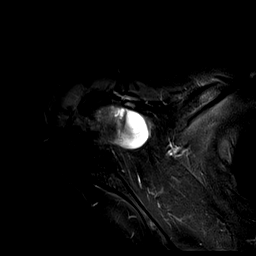

[Series 6: PD · sagittal · 4.0mm · 0.29mm/px · 9 of 18 slices shown]
[im 1/18]
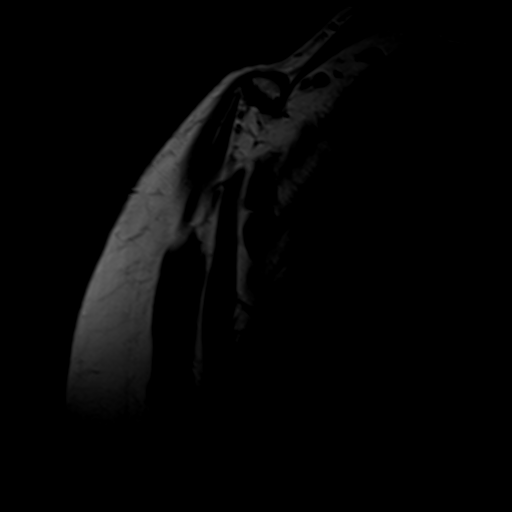
[im 3/18]
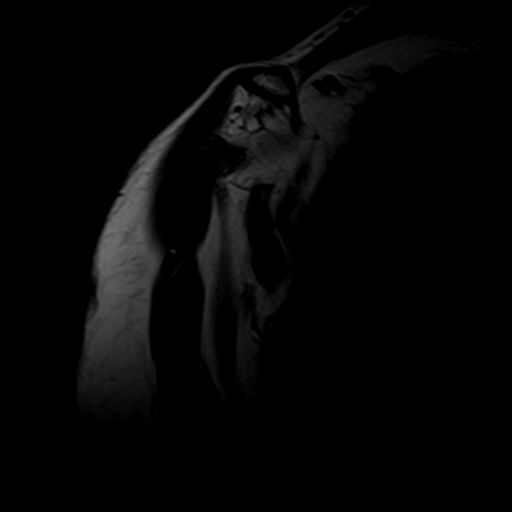
[im 5/18]
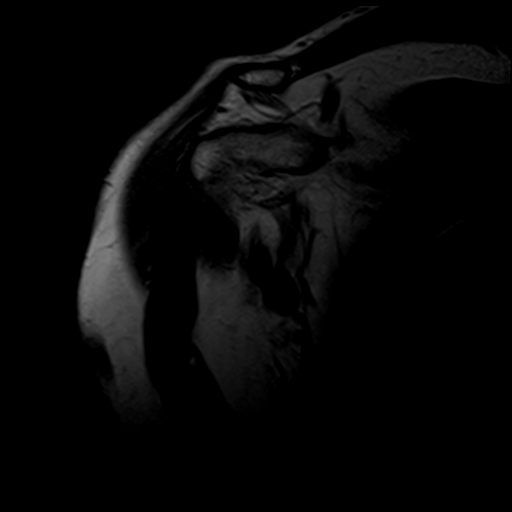
[im 7/18]
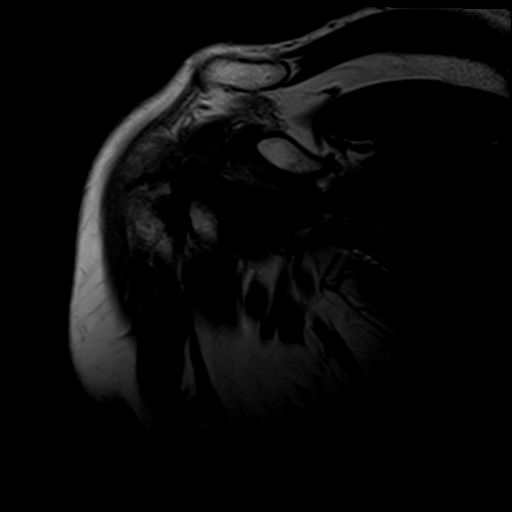
[im 9/18]
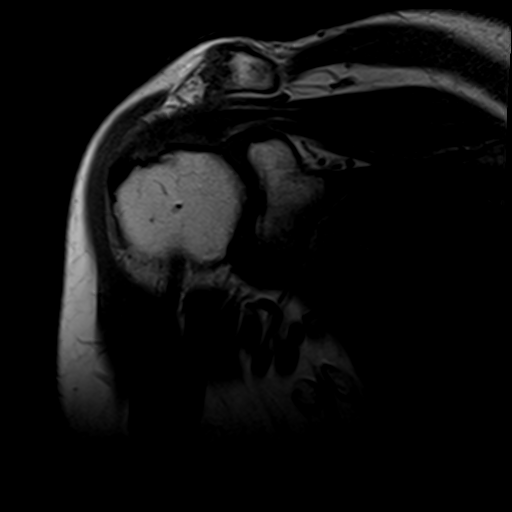
[im 11/18]
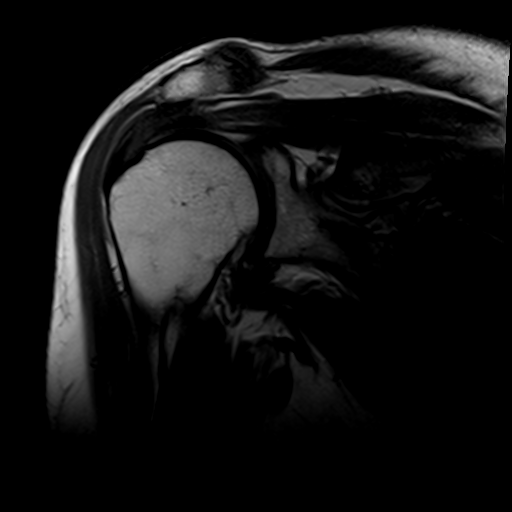
[im 13/18]
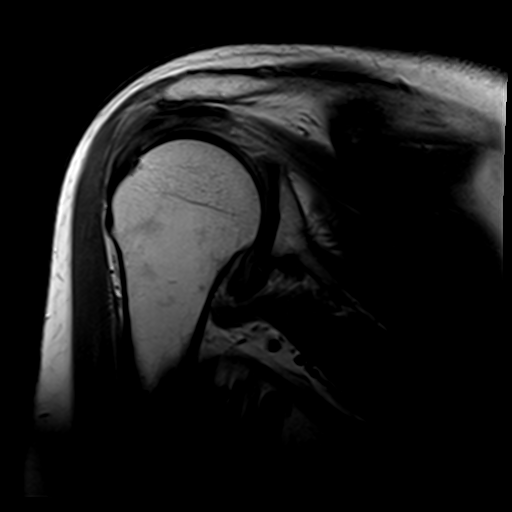
[im 15/18]
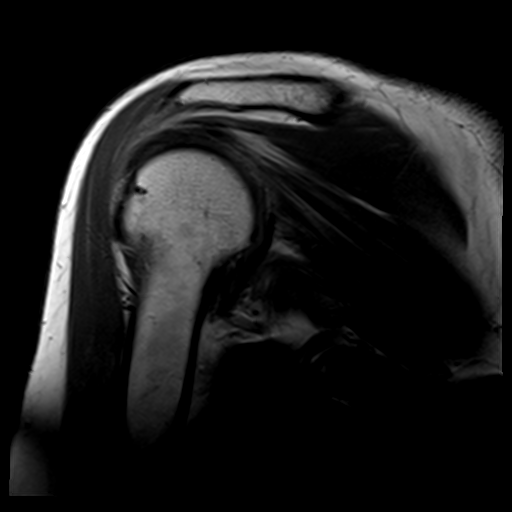
[im 18/18]
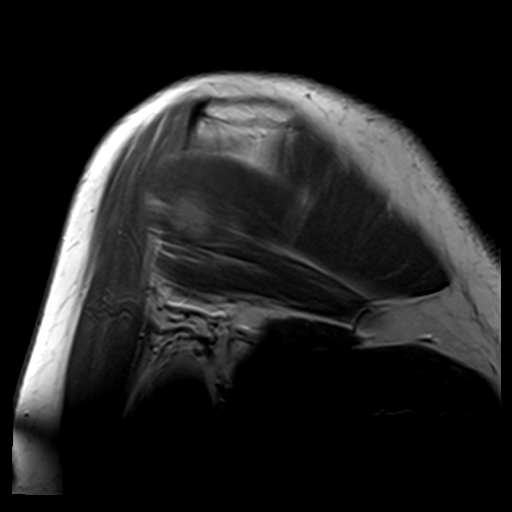

[Series 7: T2 fat-sat · oblique · 4.0mm · 0.59mm/px · 7 of 15 slices shown (2 of 3)]
[im 1/15]
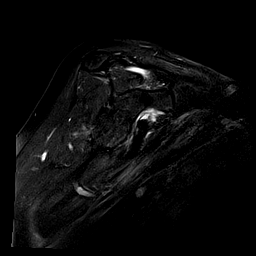
[im 3/15]
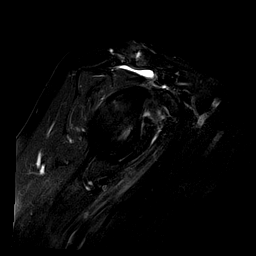
[im 5/15]
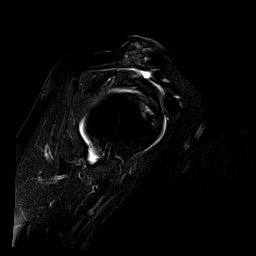
[im 8/15]
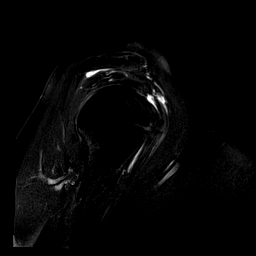
[im 10/15]
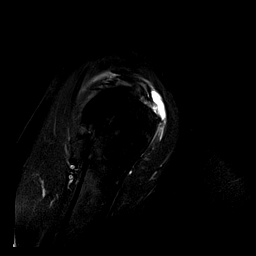
[im 12/15]
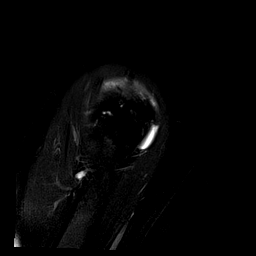
[im 15/15]
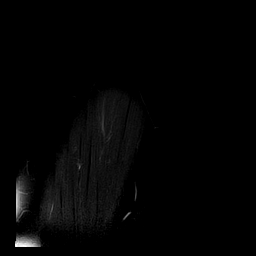

[Series 8: T2 fat-sat · sagittal · 4.0mm · 0.29mm/px · 5 of 18 slices shown (3 of 3)]
[im 1/18]
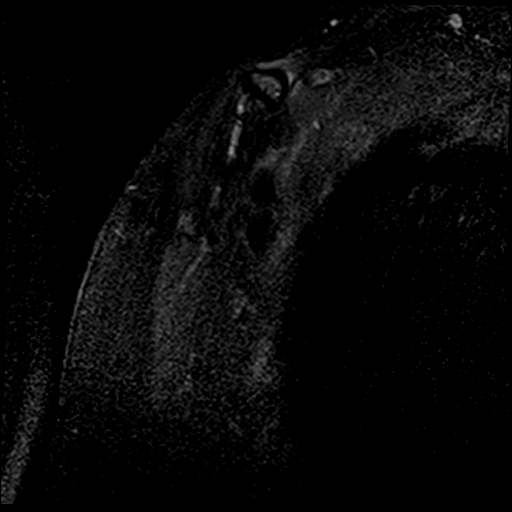
[im 3/18]
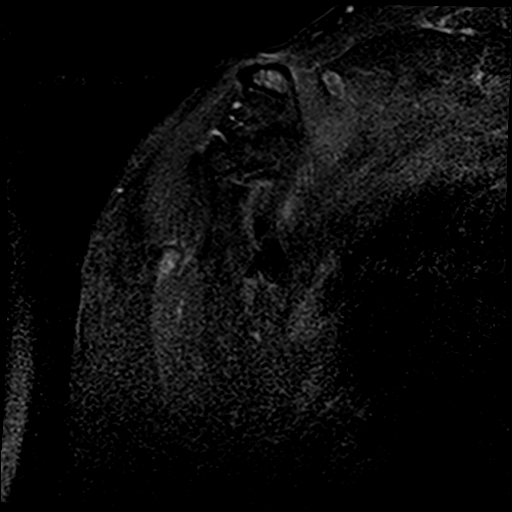
[im 5/18]
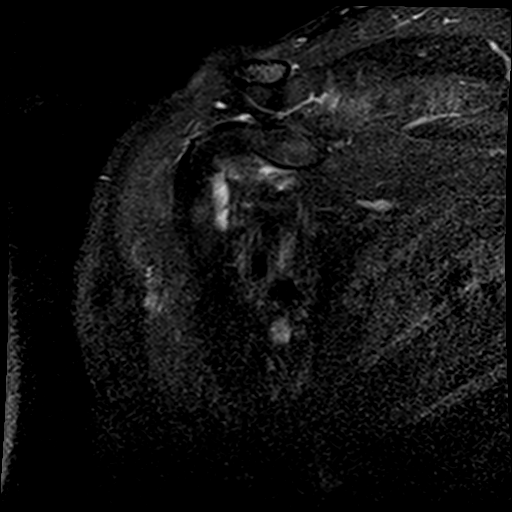
[im 9/18]
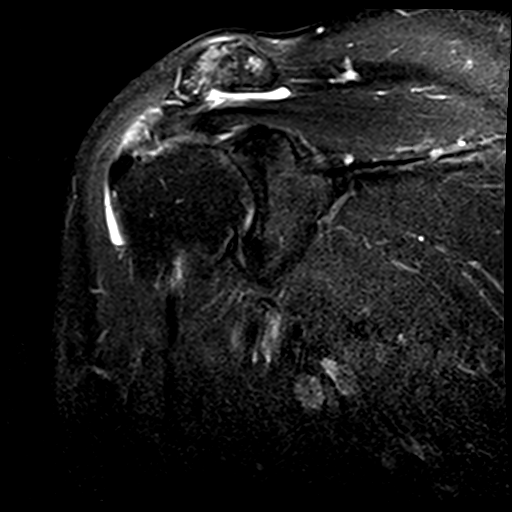
[im 15/18]
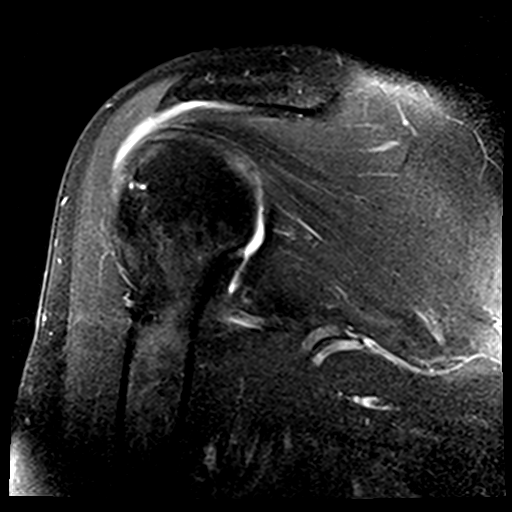

[29 of 40 positions shown; findings below may reference images not displayed]

FINDINGS: Rotator cuff: The full-thickness tear of the anterior aspect of the
distal supraspinous tendon has increased from 13 x 9 mm to 17 x 19
mm. There has been progression the small intrasubstance tears at the
musculotendinous junction of the infraspinatus. Teres minor and
subscapularis are intact

Muscles: No significant atrophy or appreciable edema. No change
since the prior study.

Biceps long head: Properly located and intact with focal intrinsic
degeneration, unchanged.

Acromioclavicular Joint:  Minimal arthropathy.  Type 2 acromion.

Glenohumeral Joint: New small glenohumeral joint effusion.

Labrum:  Intact.

Bones:  No significant bone abnormality.

Other: None.
IMPRESSION: 1. Enlargement of full-thickness tear of the distal supraspinatus
tendon.
2. Progression of intrasubstance musculotendinous tear of the
infraspinatus.
3. New small glenohumeral joint effusion.

## 2016-08-20 ENCOUNTER — Encounter (INDEPENDENT_AMBULATORY_CARE_PROVIDER_SITE_OTHER): Payer: Self-pay | Admitting: Orthopaedic Surgery

## 2016-08-20 ENCOUNTER — Ambulatory Visit (INDEPENDENT_AMBULATORY_CARE_PROVIDER_SITE_OTHER): Payer: Medicare HMO | Admitting: Orthopaedic Surgery

## 2016-08-20 VITALS — BP 138/75 | HR 87 | Ht 66.0 in | Wt 140.0 lb

## 2016-08-20 DIAGNOSIS — M75121 Complete rotator cuff tear or rupture of right shoulder, not specified as traumatic: Secondary | ICD-10-CM | POA: Diagnosis not present

## 2016-08-20 DIAGNOSIS — G8929 Other chronic pain: Secondary | ICD-10-CM

## 2016-08-20 DIAGNOSIS — M25511 Pain in right shoulder: Secondary | ICD-10-CM

## 2016-08-20 NOTE — Progress Notes (Signed)
Office Visit Note   Patient: Jeanne Schwartz           Date of Birth: 1946/04/03           MRN: [id number] Visit Date: 08/20/2016              Requested by: No referring provider defined for this encounter. PCP: No PCP Per Patient   Assessment & Plan: Visit Diagnoses:  1. Chronic right shoulder pain   2. Complete tear of right rotator cuff     Plan: Patient has increased symptoms with progressive tearing over the last 5 years. I would be outpatient arthroscopy with rotator cuff repair. We discussed postoperative mobilization with the sling 3 TO 6 weeks based on findings Intra-Op. We discussed potential for repeat tear possibly problems with stiffness.  Follow-Up Instructions: No Follow-up on file.   Orders:  No orders of the defined types were placed in this encounter.  No orders of the defined types were placed in this encounter.     Procedures: No procedures performed   Clinical Data: No additional findings. Study Result   CLINICAL DATA:  Right shoulder and upper arm pain for 4-5 years.  EXAM: MRI OF THE RIGHT SHOULDER WITHOUT CONTRAST  TECHNIQUE: Multiplanar, multisequence MR imaging of the shoulder was performed. No intravenous contrast was administered.  COMPARISON:  MRI dated 09/22/2011 and radiographs dated 06/30/2010  FINDINGS: Rotator cuff: The full-thickness tear of the anterior aspect of the distal supraspinous tendon has increased from 13 x 9 mm to 17 x 19 mm. There has been progression the small intrasubstance tears at the musculotendinous junction of the infraspinatus. Teres minor and subscapularis are intact  Muscles: No significant atrophy or appreciable edema. No change since the prior study.  Biceps long head: Properly located and intact with focal intrinsic degeneration, unchanged.  Acromioclavicular Joint:  Minimal arthropathy.  Type 2 acromion.  Glenohumeral Joint: New small glenohumeral joint effusion.  Labrum:   Intact.  Bones:  No significant bone abnormality.  Other: None.  IMPRESSION: 1. Enlargement of full-thickness tear of the distal supraspinatus tendon. 2. Progression of intrasubstance musculotendinous tear of the infraspinatus. 3. New small glenohumeral joint effusion.   Electronically Signed   By: Francene BoyersJames  Maxwell M.D.   On: 08/08/2016 20:39   Vitals   Height Weight BMI (Calculated)  5\' 6"  (1.676 m) 140 lb (63.5 kg) 22.6  External Result Report   External Result Report  Imaging   Imaging Information  Signed by   Signed Date/Time  Phone Pager  Francene BoyersMAXWELL, JAMES 08/08/2016 8:39 PM    Signed   Electronically signed by Francene BoyersJames Maxwell, MD on 08/08/16 at 2039 EDT  Original Order   Ordered On Ordered By   08/02/2016 10:30 AM Samuella CotaSabrina T Simmons, CMA          Subjective: Chief Complaint  Patient presents with  . Right Shoulder - Follow-up    Jeanne Schwartz is here to review MRI of Right Shoulder.  She states that the pain is radiating down further in the arm. No other changes in her symptoms.  She had MRI 2012 showed full-thickness rotator cuff tear at that time was about a half-inch from the supraspinatus and she wanted to wait on any surgical intervention at that time. Last 6 months her shoulders got progressively worse and new MRI scan shows progressive tearing of the rotator cuff supraspinatus and partial tearing of the infraspinatus.  Review of Systems  Constitutional: Negative for chills and diaphoresis.  HENT: Negative for ear discharge, ear pain and nosebleeds.   Eyes: Negative for discharge and visual disturbance.  Respiratory: Negative for cough, choking and shortness of breath.   Cardiovascular: Negative for chest pain and palpitations.  Gastrointestinal: Negative for abdominal distention and abdominal pain.       Positive for history of gastritis  Endocrine: Negative for cold intolerance and heat intolerance.  Genitourinary: Negative for flank pain and  hematuria.  Musculoskeletal:       Previous cervical fusion  Skin: Negative for rash and wound.  Neurological: Negative for seizures and speech difficulty.  Hematological: Negative for adenopathy. Does not bruise/bleed easily.  Psychiatric/Behavioral: Negative for agitation and suicidal ideas.     Objective: Vital Signs: BP 138/75 (BP Location: Left Arm, Patient Position: Sitting)   Pulse 87   Ht 5\' 6"  (1.676 m)   Wt 140 lb (63.5 kg)   BMI 22.60 kg/m   Physical Exam  Constitutional: She is oriented to person, place, and time. She appears well-developed.  HENT:  Head: Normocephalic.  Right Ear: External ear normal.  Left Ear: External ear normal.  Eyes: Pupils are equal, round, and reactive to light.  Neck: No tracheal deviation present. No thyromegaly present.  Cardiovascular: Normal rate.   Pulmonary/Chest: Effort normal.  Abdominal: Soft.  Musculoskeletal:  Positive drop arm test right positive Neer positive Hawkins. Long head of biceps is mildly tender. Sensation that M these are intact reflexes are 2+ and symmetrical pulses are normal. Normal heel-to-toe gait. Port flexion cervical spine to finger breast and the chest no brachial plexus tenderness. No lower extremity hyperreflexia. Opposite left arm shows a negative impingement negative drop arm test. Right shoulder shows no anterior instability.  Neurological: She is alert and oriented to person, place, and time.  Skin: Skin is warm and dry.  Psychiatric: She has a normal mood and affect. Her behavior is normal.    Ortho Exam positive drop arm test right. No synovitis of the hands fingers elbows. Good range of motion of elbows. Full passive range of motion the shoulder pain with active abduction of the right shoulder pain with resisted flexion of the shoulder. Negative liftoff test.  Specialty Comments:  No specialty comments available.  Imaging: No results found.   PMFS History: Patient Active Problem List    Diagnosis Date Noted  . 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 Cervical spondylosis 10/02/2011  . Rotator cuff tear, right 10/02/2011  . ARTHRITIS 02/27/2008  . GASTRITIS 01/16/2008  . ABDOMINAL PAIN, EPIGASTRIC 01/15/2008   Past Medical History:  Diagnosis Date  . GERD (gastroesophageal reflux disease)    TAKES PRILOSEC  . Headache(784.0)    SINCE NECK  THING  . High cholesterol    TAKES LIPITOR  . PONV (postoperative nausea and vomiting)    NOTHING IN THE LAST COUPLE YRS  . Rotator cuff tear, right    JUST FOUND OUT THIS WEEK    No family history on file.  Past Surgical History:  Procedure Laterality Date  . ABDOMINAL HYSTERECTOMY    . ANTERIOR CERVICAL DECOMP/DISCECTOMY FUSION  10/01/2011   Procedure: ANTERIOR CERVICAL DECOMPRESSION/DISCECTOMY FUSION 2 LEVELS;  Surgeon: Eldred MangesMark C Tanish Sinkler;  Location: [facility];  Service: Orthopedics;  Laterality: N/A;  C5-6, C6-7 Anterior Cervical Discectomy and Fusion, Allograft and Plate  . BLEPHAROPLASTY     2001  . BREAST SURGERY     10 YRS  BENIGN  . FRACTURE SURGERY     LEFT  WRIST  . LEFT  WRIST     METAL PLATE   . TUBAL LIGATION     Social History   Occupational History  . Not on file.   Social History Main Topics  . Smoking status: Never Smoker  . Smokeless tobacco: Not on file  . Alcohol use No  . Drug use: No  . Sexual activity: Not on file

## 2016-08-24 ENCOUNTER — Ambulatory Visit (INDEPENDENT_AMBULATORY_CARE_PROVIDER_SITE_OTHER): Payer: Medicare HMO | Admitting: Orthopaedic Surgery

## 2016-08-30 DIAGNOSIS — S43431D Superior glenoid labrum lesion of right shoulder, subsequent encounter: Secondary | ICD-10-CM | POA: Diagnosis not present

## 2016-08-30 DIAGNOSIS — M7521 Bicipital tendinitis, right shoulder: Secondary | ICD-10-CM | POA: Diagnosis not present

## 2016-08-30 DIAGNOSIS — M75121 Complete rotator cuff tear or rupture of right shoulder, not specified as traumatic: Secondary | ICD-10-CM | POA: Diagnosis not present

## 2016-09-07 ENCOUNTER — Encounter (INDEPENDENT_AMBULATORY_CARE_PROVIDER_SITE_OTHER): Payer: Self-pay | Admitting: Orthopaedic Surgery

## 2016-09-07 ENCOUNTER — Ambulatory Visit (INDEPENDENT_AMBULATORY_CARE_PROVIDER_SITE_OTHER): Payer: Medicare HMO | Admitting: Orthopaedic Surgery

## 2016-09-07 VITALS — BP 139/81 | HR 76 | Ht 66.0 in | Wt 140.0 lb

## 2016-09-07 DIAGNOSIS — M75121 Complete rotator cuff tear or rupture of right shoulder, not specified as traumatic: Secondary | ICD-10-CM

## 2016-09-07 NOTE — Progress Notes (Signed)
   Post-Op Visit Note   Patient: Jeanne Schwartz           Date of Birth: 1946/03/17           MRN: [id number] Visit Date: 09/07/2016 PCP: No PCP Per Patient   Assessment & Plan:  Chief Complaint:  Chief Complaint  Patient presents with  . Right Shoulder - Routine Post Op   Visit Diagnoses:  1. Complete tear of right rotator cuff   2.      Right SLAP tear with biceps tendinopathy postop from biceps tenodesis long head  Plan: Return office visit 5 weeks. Liter start some pain and resistant therapy at that time or consider formal physical therapy. Norco 5/325   #25 tablets prescribed.  Follow-Up Instructions: Return in about 5 weeks (around 10/12/2016).   Orders:  No orders of the defined types were placed in this encounter.  No orders of the defined types were placed in this encounter.  Patient is here to follow up status post right shoulder arthroscopy with rotator cuff repair 8 days ago. She states that she is not having any pain now, but has substantial pain when moving her arm.  The pain is from right shoulder to right elbow. She takes Oxycodone for pain as needed. She is almost out of this prescription. Sutures removed, portals look good. Steris were removed at time of dressing change per patient. Incision looks good.  PMFS History: Patient Active Problem List   Diagnosis Date Noted  . 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 Cervical spondylosis 10/02/2011  . Rotator cuff tear, right 10/02/2011  . ARTHRITIS 02/27/2008  . GASTRITIS 01/16/2008  . ABDOMINAL PAIN, EPIGASTRIC 01/15/2008   Past Medical History:  Diagnosis Date  . GERD (gastroesophageal reflux disease)    TAKES PRILOSEC  . Headache(784.0)    SINCE NECK  THING  . High cholesterol    TAKES LIPITOR  . PONV (postoperative nausea and vomiting)    NOTHING IN THE LAST COUPLE YRS  . Rotator cuff tear, right    JUST FOUND OUT THIS WEEK    No family history on file.  Past Surgical History:  Procedure  Laterality Date  . ABDOMINAL HYSTERECTOMY    . ANTERIOR CERVICAL DECOMP/DISCECTOMY FUSION  10/01/2011   Procedure: ANTERIOR CERVICAL DECOMPRESSION/DISCECTOMY FUSION 2 LEVELS;  Surgeon: Eldred MangesMark C Vidal Lampkins;  Location: [facility];  Service: Orthopedics;  Laterality: N/A;  C5-6, C6-7 Anterior Cervical Discectomy and Fusion, Allograft and Plate  . BLEPHAROPLASTY     2001  . BREAST SURGERY     10 YRS  BENIGN  . FRACTURE SURGERY     LEFT  WRIST  . LEFT WRIST     METAL PLATE   . SHOULDER SURGERY Right    arthroscopy and rotator cuff repair  . TUBAL LIGATION     Social History   Occupational History  . Not on file.   Social History Main Topics  . Smoking status: Never Smoker  . Smokeless tobacco: Not on file  . Alcohol use No  . Drug use: No  . Sexual activity: Not on file

## 2016-09-17 ENCOUNTER — Ambulatory Visit (INDEPENDENT_AMBULATORY_CARE_PROVIDER_SITE_OTHER): Payer: Medicare HMO | Admitting: Orthopaedic Surgery

## 2016-10-12 ENCOUNTER — Encounter (INDEPENDENT_AMBULATORY_CARE_PROVIDER_SITE_OTHER): Payer: Self-pay | Admitting: Orthopaedic Surgery

## 2016-10-12 ENCOUNTER — Ambulatory Visit (INDEPENDENT_AMBULATORY_CARE_PROVIDER_SITE_OTHER): Payer: Medicare HMO | Admitting: Orthopaedic Surgery

## 2016-10-12 VITALS — BP 159/87 | HR 80 | Ht 66.0 in | Wt 140.0 lb

## 2016-10-12 DIAGNOSIS — M75121 Complete rotator cuff tear or rupture of right shoulder, not specified as traumatic: Secondary | ICD-10-CM

## 2016-10-12 NOTE — Progress Notes (Signed)
   Post-Op Visit Note   Patient: Jeanne Schwartz           Date of Birth: 05-13-1946           MRN: [id number] Visit Date: 10/12/2016 PCP: No PCP Per Patient   Assessment & Plan:  Chief Complaint:  Chief Complaint  Patient presents with  . Right Shoulder - Routine Post Op   Visit Diagnoses:  1. Complete tear of right rotator cuff           Biceps tenodesis and rotator cuff repair.  Plan: She has exercise bands at home.  Prescription for Norco 10 tablets prescribed. She has a pulley at home she has done exercises in the past. I gave her prescription for physical therapy at [facility] since she [place] in [place]. She has therapy tubing at home I gave her specific instructions on rotator cuff strengthening. She can call and set up an appointment if she feels she needs formal physical therapy. She can reach arm up overhead and she's not even 6 weeks out from surgery. Patient has gotten good relief of preop pain.  Follow-Up Instructions: Return in about 1 month (around 11/12/2016).   Orders:  No orders of the defined types were placed in this encounter.  No orders of the defined types were placed in this encounter.  HPI Patient returns for five week follow up. She is status post right shoulder arthroscopy with rotator cuff repair and biceps tendonesis on 08/30/2016. She states that she is doing well. She takes 1/2 Hydrocodone every other night.   PMFS History: Patient Active Problem List   Diagnosis Date Noted  . 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 Cervical spondylosis 10/02/2011  . Rotator cuff tear, right 10/02/2011  . ARTHRITIS 02/27/2008  . GASTRITIS 01/16/2008  . ABDOMINAL PAIN, EPIGASTRIC 01/15/2008   Past Medical History:  Diagnosis Date  . GERD (gastroesophageal reflux disease)    TAKES PRILOSEC  . Headache(784.0)    SINCE NECK  THING  . High cholesterol    TAKES LIPITOR  . PONV (postoperative nausea and vomiting)    NOTHING IN THE LAST COUPLE YRS  .  Rotator cuff tear, right    JUST FOUND OUT THIS WEEK    No family history on file.  Past Surgical History:  Procedure Laterality Date  . ABDOMINAL HYSTERECTOMY    . ANTERIOR CERVICAL DECOMP/DISCECTOMY FUSION  10/01/2011   Procedure: ANTERIOR CERVICAL DECOMPRESSION/DISCECTOMY FUSION 2 LEVELS;  Surgeon: Eldred MangesMark C Domani Bakos;  Location: [facility];  Service: Orthopedics;  Laterality: N/A;  C5-6, C6-7 Anterior Cervical Discectomy and Fusion, Allograft and Plate  . BLEPHAROPLASTY     2001  . BREAST SURGERY     10 YRS  BENIGN  . FRACTURE SURGERY     LEFT  WRIST  . LEFT WRIST     METAL PLATE   . SHOULDER SURGERY Right    arthroscopy and rotator cuff repair  . TUBAL LIGATION     Social History   Occupational History  . Not on file.   Social History Main Topics  . Smoking status: Never Smoker  . Smokeless tobacco: Not on file  . Alcohol use No  . Drug use: No  . Sexual activity: Not on file

## 2016-11-09 ENCOUNTER — Ambulatory Visit (INDEPENDENT_AMBULATORY_CARE_PROVIDER_SITE_OTHER): Payer: Medicare HMO | Admitting: Orthopaedic Surgery

## 2017-06-15 ENCOUNTER — Encounter: Payer: Self-pay | Admitting: Diagnostic Neuroimaging

## 2017-06-15 ENCOUNTER — Ambulatory Visit (INDEPENDENT_AMBULATORY_CARE_PROVIDER_SITE_OTHER): Payer: Medicare HMO | Admitting: Diagnostic Neuroimaging

## 2017-06-15 VITALS — BP 129/84 | HR 81 | Ht 66.0 in | Wt 153.0 lb

## 2017-06-15 DIAGNOSIS — R41840 Attention and concentration deficit: Secondary | ICD-10-CM | POA: Diagnosis not present

## 2017-06-15 DIAGNOSIS — M79604 Pain in right leg: Secondary | ICD-10-CM | POA: Diagnosis not present

## 2017-06-15 DIAGNOSIS — M79605 Pain in left leg: Secondary | ICD-10-CM | POA: Diagnosis not present

## 2017-06-15 DIAGNOSIS — H532 Diplopia: Secondary | ICD-10-CM | POA: Diagnosis not present

## 2017-06-15 DIAGNOSIS — R5383 Other fatigue: Secondary | ICD-10-CM | POA: Diagnosis not present

## 2017-06-15 DIAGNOSIS — M79601 Pain in right arm: Secondary | ICD-10-CM

## 2017-06-15 NOTE — Progress Notes (Signed)
[facility]  PATIENT: Jeanne Schwartz DOB: April 04, 1946  REFERRING CLINICIAN:  Adalberto Ill, PA-c HISTORY FROM: patient and husband and chart review REASON FOR VISIT: new consult    HISTORICAL  CHIEF COMPLAINT:  Chief Complaint  Patient presents with  . NP  [name]  . Double Vision/ Occipital Headache    While taking prednisone, had this episodes.      HISTORY OF PRESENT ILLNESS:   71 year old female, right-handed, here for evaluation of constellation of symptoms including right shoulder and arm pain, bilateral leg pain, numbness in legs, head and neck pain, double vision, fatigue problems, sleep and concentration difficulty.  Patient has right shoulder surgery and rotator cuff repair in November 2017. Patient had fairly good recovery following this. However in May 2018 patient had onset of right hand pain radiating into her right forearm. She would have intermittent sharp pains around her right shoulder as well. Around the same time she was having pain and numbness in her bilateral ankles. Patient also had headache and pain in the back of her head area patient was treated with prednisone which seemed to improve symptoms, but cause some agitation and anxiety.  Patient also is having one year of increasing concentration and attention difficulties. She has daytime fatigue. She has interrupted sleep. She averages 3-4 hours of interrupted sleep per night. She used to sleep 5-6 hours of sleep per night.  Patient also had episode of double vision, diagonal, which may have persisted even with closing one eye. This lasted for a short time frame. She may have had a few episodes of this. This is also seemed to have improved.   REVIEW OF SYSTEMS: Full 14 system review of systems performed and negative with exception of: Fatigue blurred vision double vision insomnia racing thoughts decreased energy.  ALLERGIES: Allergies  Allergen Reactions  . Penicillins Shortness Of  Breath  . Prochlorperazine Edisylate Shortness Of Breath  . Sulfonamide Derivatives Nausea Only    HOME MEDICATIONS: Outpatient Medications Prior to Visit  Medication Sig Dispense Refill  . atorvastatin (LIPITOR) 20 MG tablet Take 10 mg by mouth daily.      . cholecalciferol (VITAMIN D) 1000 UNITS tablet Take 1,000 Units by mouth daily.      Marland Kitchen estradiol (CLIMARA - DOSED IN MG/24 HR) 0.05 mg/24hr patch     . estradiol (VIVELLE-DOT) 0.05 MG/24HR Place 0.5 patches onto the skin once a week. Saturdays    . Multiple Vitamin (MULTIVITAMIN) capsule Take by mouth.    Marland Kitchen omeprazole (PRILOSEC) 20 MG capsule Take 20 mg by mouth daily.      Marland Kitchen OVER THE COUNTER MEDICATION Take 1 tablet by mouth daily. Adult Gummy Vitamins     . RaNITidine HCl (RANITIDINE 75 PO)     . HYDROcodone-acetaminophen (NORCO/VICODIN) 5-325 MG tablet TK 1 T PO  Q 6 HOURS PRN P  0  . oxyCODONE-acetaminophen (PERCOCET/ROXICET) 5-325 MG tablet     . zolpidem (AMBIEN) 10 MG tablet Take 5 mg by mouth at bedtime as needed. Insomnia       No facility-administered medications prior to visit.     PAST MEDICAL HISTORY: Past Medical History:  Diagnosis Date  . GERD (gastroesophageal reflux disease)    TAKES PRILOSEC  . Headache(784.0)    SINCE NECK  THING  . High cholesterol    TAKES LIPITOR  . PONV (postoperative nausea and vomiting)    NOTHING IN THE LAST COUPLE YRS  . Rotator cuff tear, right  JUST FOUND OUT THIS WEEK    PAST SURGICAL HISTORY: Past Surgical History:  Procedure Laterality Date  . ABDOMINAL HYSTERECTOMY    . ANTERIOR CERVICAL DECOMP/DISCECTOMY FUSION  10/01/2011   Procedure: ANTERIOR CERVICAL DECOMPRESSION/DISCECTOMY FUSION 2 LEVELS;  Surgeon: Marybelle Killings;  Location: [facility];  Service: Orthopedics;  Laterality: N/A;  C5-6, C6-7 Anterior Cervical Discectomy and Fusion, Allograft and Plate  . BLEPHAROPLASTY     2001  . BREAST SURGERY     10 YRS  BENIGN  . FRACTURE SURGERY     LEFT  WRIST  . LEFT WRIST      METAL PLATE   . SHOULDER SURGERY Right    arthroscopy and rotator cuff repair  . TUBAL LIGATION      FAMILY HISTORY: No family history on file.  SOCIAL HISTORY:  Social History   Social History  . Marital status: Married    Spouse name: N/A  . Number of children: N/A  . Years of education: N/A   Occupational History  . Not on file.   Social History Main Topics  . Smoking status: Never Smoker  . Smokeless tobacco: Never Used  . Alcohol use No  . Drug use: No  . Sexual activity: Not on file   Other Topics Concern  . Not on file   Social History Narrative   [place]ves at home with husband , [name].  One child Jeanne Schwartz.      PHYSICAL EXAM  GENERAL EXAM/CONSTITUTIONAL: Vitals:  Vitals:   06/15/17 1008  BP: 129/84  Pulse: 81  Weight: 153 lb (69.4 kg)  Height: _0  (1.676 m)     Body mass index is 24.69 kg/m.  Visual Acuity Screening   Right eye Left eye Both eyes  Without correction: 20/40 20/40   With correction:        Patient is in no distress; well developed, nourished and groomed; neck is supple  CARDIOVASCULAR:  Examination of carotid arteries is normal; no carotid bruits  Regular rate and rhythm, no murmurs  Examination of peripheral vascular system by observation and palpation is normal  EYES:  Ophthalmoscopic exam of optic discs and posterior segments is normal; no papilledema or hemorrhages  MUSCULOSKELETAL:  Gait, strength, tone, movements noted in Neurologic exam below  NEUROLOGIC: MENTAL STATUS:  No flowsheet data found.  awake, alert, oriented to person, place and time  recent and remote memory intact  normal attention and concentration  language fluent, comprehension intact, naming intact,   fund of knowledge appropriate  CRANIAL NERVE:   2nd - no papilledema on fundoscopic exam  2nd, 3rd, 4th, 6th - pupils equal and reactive to light, visual fields full to confrontation, extraocular muscles intact, no  nystagmus  5th - facial sensation symmetric  7th - facial strength symmetric  8th - hearing intact  9th - palate elevates symmetrically, uvula midline  11th - shoulder shrug symmetric  12th - tongue protrusion midline  MOTOR:   normal bulk and tone, full strength in the BUE, BLE  SENSORY:   normal and symmetric to light touch, temperature, vibration  COORDINATION:   finger-nose-finger, fine finger movements normal  REFLEXES:   deep tendon reflexes present and symmetric  GAIT/STATION:   narrow based gait    DIAGNOSTIC DATA (LABS, IMAGING, TESTING) - I reviewed patient records, labs, notes, testing and imaging myself where available.  Lab Results  Component Value Date   WBC 5.8 09/14/2012   HGB 13.4 09/14/2012   HCT 39.6 09/14/2012  MCV 86.5 09/14/2012   PLT 261 09/14/2012      Component Value Date/Time   NA 140 09/14/2012 1200   K 4.0 09/14/2012 1200   CL 102 09/14/2012 1200   CO2 24 09/14/2012 1200   GLUCOSE 95 09/14/2012 1200   BUN 15 09/14/2012 1200   CREATININE 1.03 09/14/2012 1200   CALCIUM 10.0 09/14/2012 1200   PROT 6.9 09/14/2012 1740   ALBUMIN 3.9 09/14/2012 1740   AST 25 09/14/2012 1740   ALT 21 09/14/2012 1740   ALKPHOS 50 09/14/2012 1740   BILITOT 0.6 09/14/2012 1740   GFRNONAA 56 (L) 09/14/2012 1200   GFRAA 65 (L) 09/14/2012 1200   No results found for: CHOL, HDL, LDLCALC, LDLDIRECT, TRIG, CHOLHDL No results found for: HGBA1C No results found for: VITAMINB12 No results found for: TSH   06/06/17 Labs ESR - 1 ANA - neg RF - neg      ASSESSMENT AND PLAN  71 y.o. year old female here with constellation of symptoms in last 1 year, with no specific unifying diagnosis. Suspect multiple etiologies for each symptom. I offered to proceed with additional neurologic testing. However symptoms seem to have significantly improved and patient would like to return to orthopedic surgeon to discuss her right shoulder, arm and hand issues.  She would like to hold off on additional neurologic testing at this time.   Dx:  1. Right arm pain   2. Double vision   3. Other fatigue   4. Pain in both lower extremities   5. Difficulty concentrating      PLAN:  RIGHT SHOULDER / ARM / HAND PAIN - possible cervical radiculopathy vs peripheral neuropathy vs shoulder arthritis and referred pain - offered MRI cervical spine and EMG/NCS; will hold off for now and patient would like to follow up with Dr. Lorin Mercy  BILATERAL LEG PAIN AND TINGLING - consider MRI lumbar spine and EMG/NCS; will hold off for now per patient request  DOUBLE VISION / [place] / HEADACHE (improving) - consider MRI brain and cervical spine; will hold off for now per patient request  FATIGUE / INSOMNIA / DIFF CONCENTRATING - reviewed sleep hygiene  Return if symptoms worsen or fail to improve.  I reviewed images, labs, notes, records myself. I summarized findings and reviewed with patient, for this high risk condition (double vision, numbness, insomnia) requiring high complexity decision making.     Penni Bombard, MD 03/15/7845, 96:29 AM Certified in Neurology, Neurophysiology and Neuroimaging  [facility] [address] [place], [place] [address] [phone]

## 2017-06-15 NOTE — Patient Instructions (Signed)
Thank you for coming to see [place] at [facility]. I hope we have been able to provide you high quality care today.  You may receive a patient satisfaction survey over the next few weeks. We would appreciate your feedback and comments so that we may continue to improve ourselves and the health of our patients.   RIGHT SHOULDER / ARM / HAND PAIN - possible pinched nerve vs arthritis - consider MRI cervical spine and EMG/NCS - follow up with Dr. Lorin Mercy  BILATERAL LEG PAIN AND TINGLING - consider MRI lumbar spine and EMG/NCS  DOUBLE VISION / [place] / HEADACHE - consider MRI brain and cervical spine  FATIGUE / INSOMNIA / DIFF CONCENTRATING - review sleep hygiene at sleep.org website   ~~~~~~~~~~~~~~~~~~~~~~~~~~~~~~~~~~~~~~~~~~~~~~~~~~~~~~~~~~~~~~~~~  DR. Raheem Kolbe'S GUIDE TO HAPPY AND HEALTHY LIVING These are some of my general health and wellness recommendations. Some of them may apply to you better than others. Please use common sense as you try these suggestions and feel free to ask me any questions.   ACTIVITY/FITNESS Mental, social, emotional and physical stimulation are very important for brain and body health. Try learning a new activity (arts, music, language, sports, games).  Keep moving your body to the best of your abilities. You can do this at home, inside or outside, the park, community center, gym or anywhere you like. Consider a physical therapist or personal trainer to get started. Consider the app Sworkit. Fitness trackers such as smart-watches, smart-phones or Fitbits can help as well.   NUTRITION Eat more plants: colorful vegetables, nuts, seeds and berries.  Eat less sugar, salt, preservatives and processed foods.  Avoid toxins such as cigarettes and alcohol.  Drink water when you are thirsty. Warm water with a slice of lemon is an excellent morning drink to start the day.  Consider these websites for more information The Nutrition  Source (https://www.henry-hernandez.biz/) Precision Nutrition (WindowBlog.ch)   RELAXATION Consider practicing mindfulness meditation or other relaxation techniques such as deep breathing, prayer, yoga, tai chi, massage. See website mindful.org or the apps Headspace or Calm to help get started.   SLEEP Try to get at least 7-8+ hours sleep per day. Regular exercise and reduced caffeine will help you sleep better. Practice good sleep hygeine techniques. See website sleep.org for more information.   PLANNING Prepare estate planning, living will, healthcare POA documents. Sometimes this is best planned with the help of an attorney. Theconversationproject.org and agingwithdignity.org are excellent resources.

## 2017-07-27 ENCOUNTER — Emergency Department (HOSPITAL_COMMUNITY)
Admission: EM | Admit: 2017-07-27 | Discharge: 2017-07-27 | Disposition: A | Discharge status: Home / Self Care | Payer: Medicare HMO | Attending: Emergency Medicine | Admitting: Emergency Medicine

## 2017-07-27 ENCOUNTER — Encounter (HOSPITAL_COMMUNITY): Payer: Self-pay | Admitting: *Deleted

## 2017-07-27 ENCOUNTER — Emergency Department (HOSPITAL_COMMUNITY): Payer: Medicare HMO

## 2017-07-27 DIAGNOSIS — G44309 Post-traumatic headache, unspecified, not intractable: Secondary | ICD-10-CM | POA: Insufficient documentation

## 2017-07-27 DIAGNOSIS — W208XXA Other cause of strike by thrown, projected or falling object, initial encounter: Secondary | ICD-10-CM | POA: Insufficient documentation

## 2017-07-27 DIAGNOSIS — G501 Atypical facial pain: Secondary | ICD-10-CM | POA: Insufficient documentation

## 2017-07-27 DIAGNOSIS — W19XXXA Unspecified fall, initial encounter: Secondary | ICD-10-CM | POA: Diagnosis not present

## 2017-07-27 DIAGNOSIS — R0789 Other chest pain: Secondary | ICD-10-CM | POA: Diagnosis not present

## 2017-07-27 DIAGNOSIS — Z79899 Other long term (current) drug therapy: Secondary | ICD-10-CM | POA: Insufficient documentation

## 2017-07-27 IMAGING — CR DG RIBS W/ CHEST 3+V*L*
3 series · 3 of 3 positions shown · non-contrast
Comparison: Radiographs September 14, 2012.

CLINICAL DATA: Left rib pain after fall yesterday.

EXAM:
LEFT RIBS AND CHEST - 3+ VIEW

[chest pa]
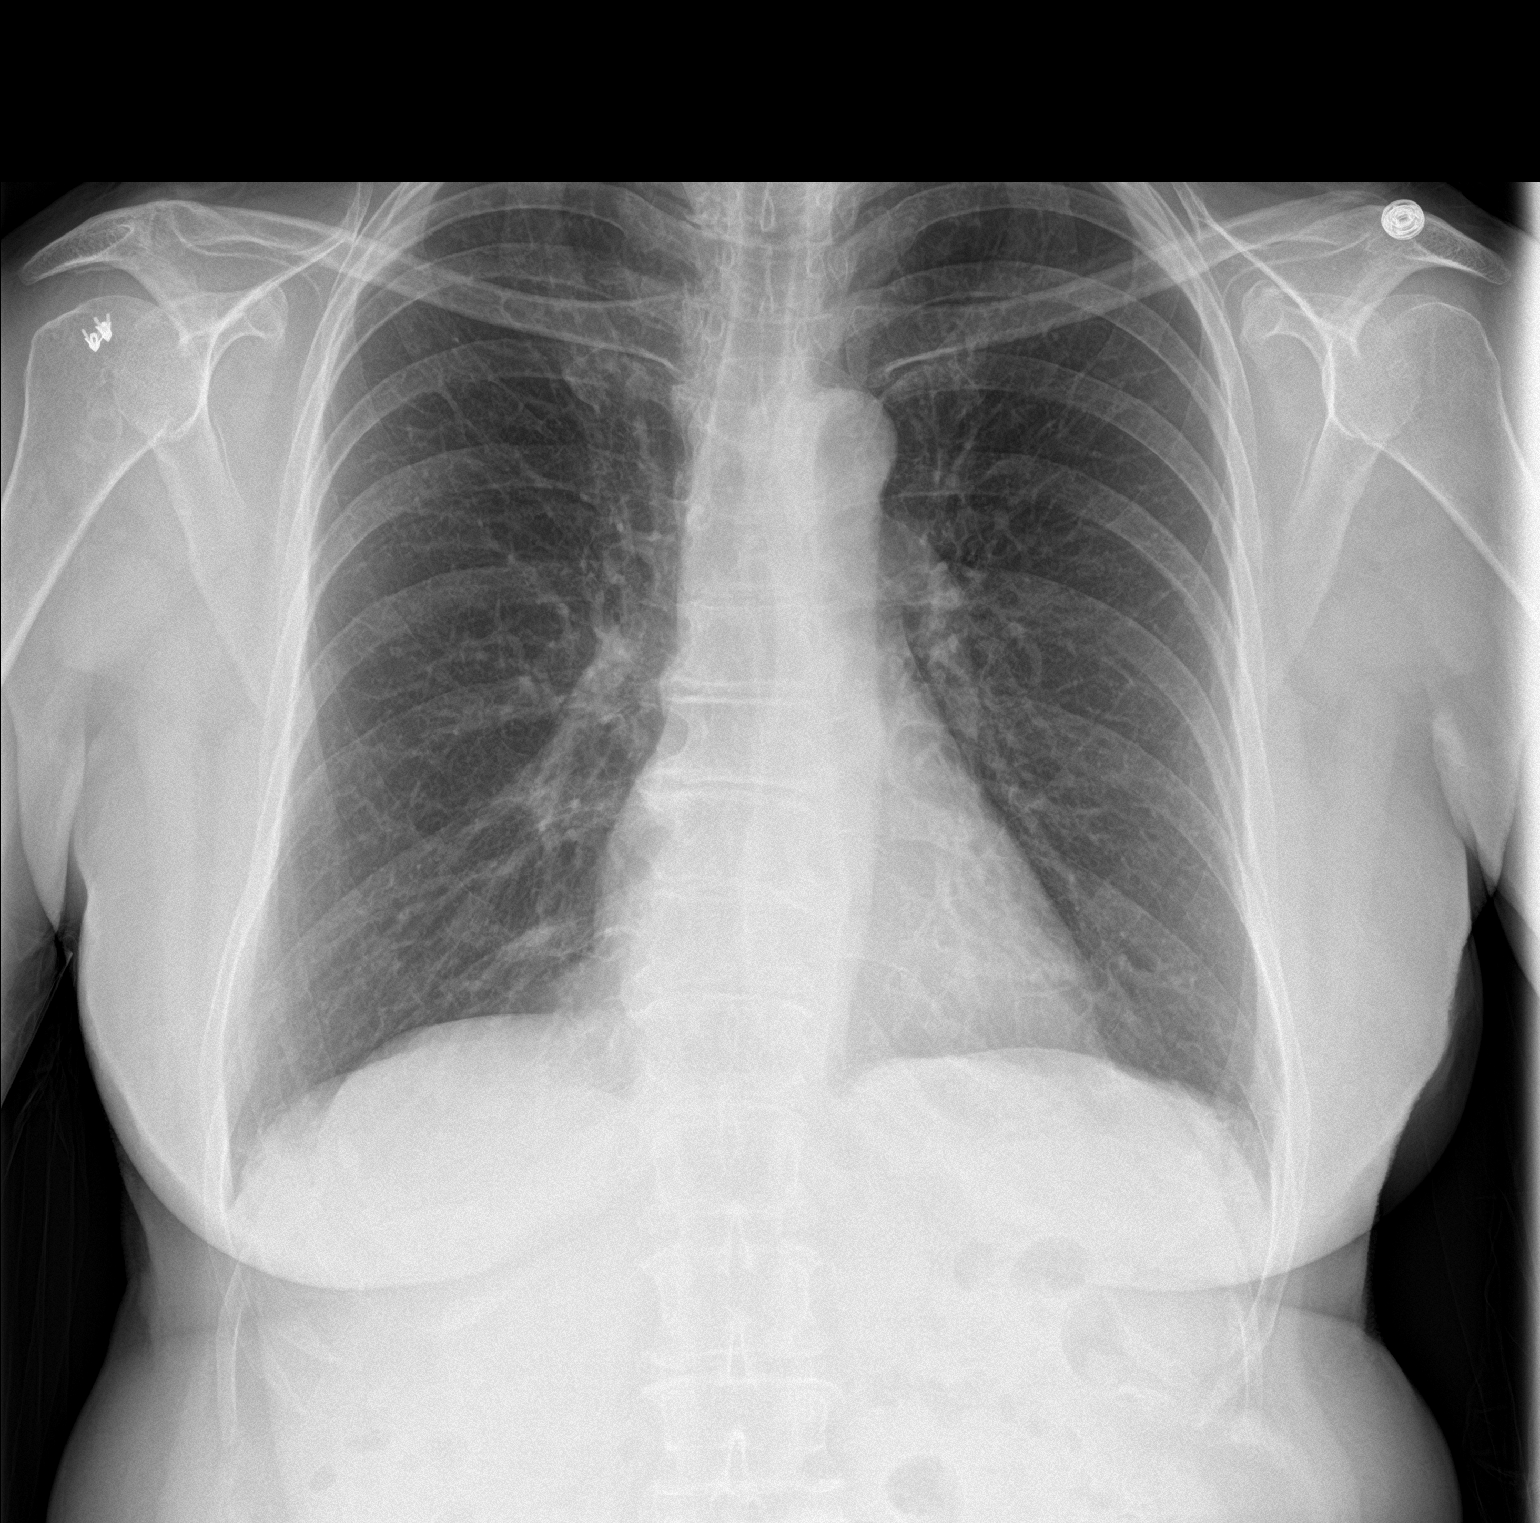

[rib pa obl]
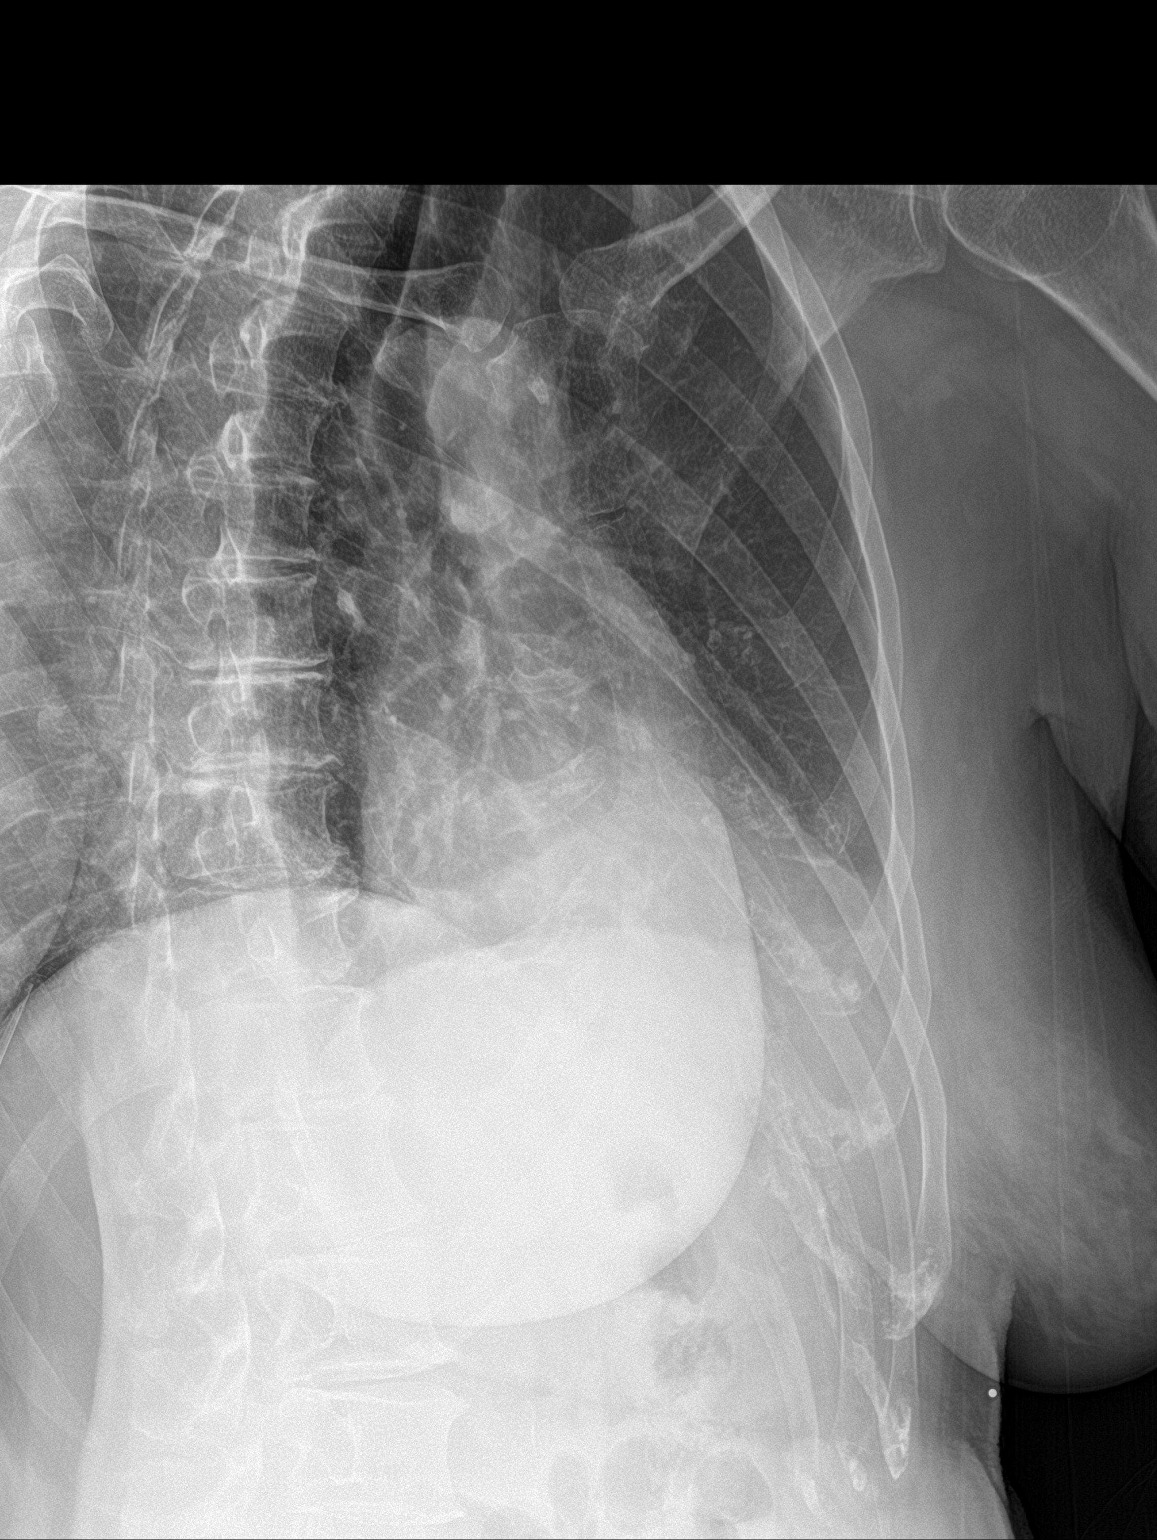

[rib pa]
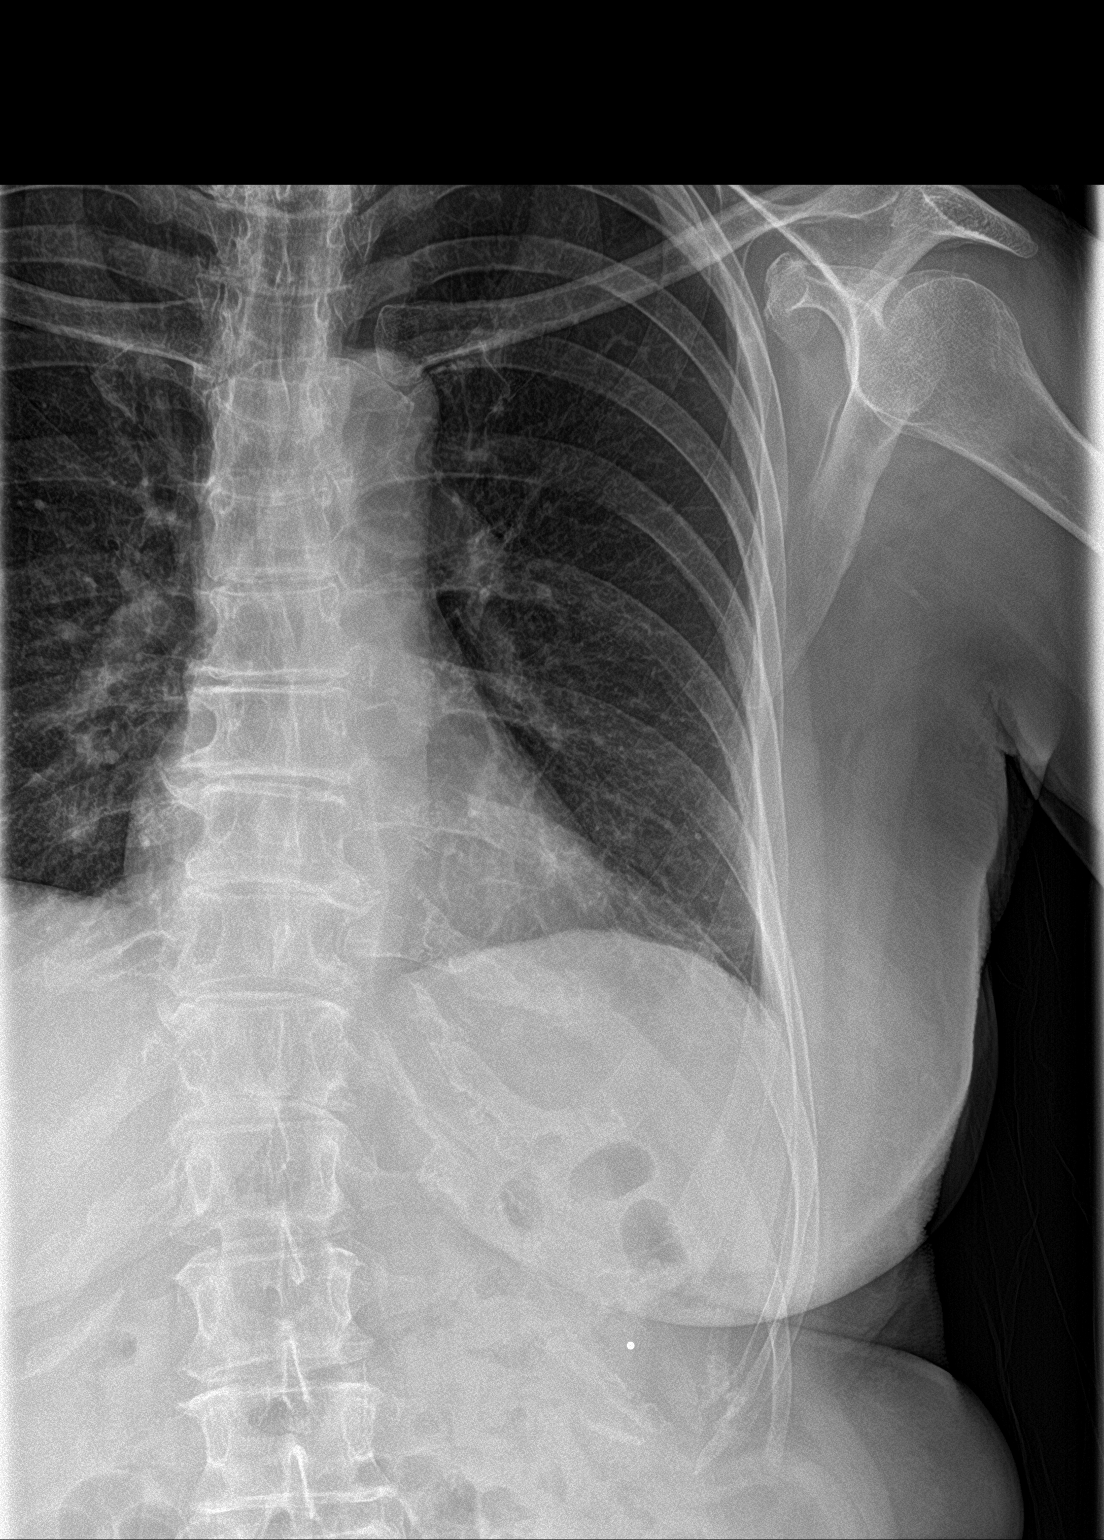

[3 of 3 positions shown; findings below may reference images not displayed]

FINDINGS: No fracture or other bone lesions are seen involving the ribs. There
is no evidence of pneumothorax or pleural effusion. Both lungs are
clear. Heart size and mediastinal contours are within normal limits.
IMPRESSION: Normal left ribs.  No acute cardiopulmonary abnormality seen.

## 2017-07-27 NOTE — ED Notes (Addendum)
Pt also states left sided pain, upper abdomen, under rib cage x 1 week.

## 2017-07-27 NOTE — ED Notes (Signed)
Patient transported to X-ray 

## 2017-07-27 NOTE — ED Notes (Signed)
Pt back from xr

## 2017-07-27 NOTE — ED Notes (Signed)
Pt stable,ambulatory, and verbalizes understanding of D/C instructions.   
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
## 2017-07-27 NOTE — Discharge Instructions (Signed)
As discussed, your evaluation today has been largely reassuring.  But, it is important that you monitor your condition carefully, and do not hesitate to return to the ED if you develop new, or concerning changes in your condition. ? ?Otherwise, please follow-up with your physician for appropriate ongoing care. ? ?
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
## 2018-04-26 ENCOUNTER — Other Ambulatory Visit: Payer: Self-pay | Admitting: Physician Assistant

## 2018-04-26 DIAGNOSIS — Z1231 Encounter for screening mammogram for malignant neoplasm of breast: Secondary | ICD-10-CM

## 2018-04-26 DIAGNOSIS — N183 Chronic kidney disease, stage 3 unspecified: Secondary | ICD-10-CM | POA: Insufficient documentation

## 2018-05-16 ENCOUNTER — Ambulatory Visit: Payer: Medicare HMO

## 2018-10-21 DIAGNOSIS — R030 Elevated blood-pressure reading, without diagnosis of hypertension: Secondary | ICD-10-CM | POA: Insufficient documentation

## 2018-11-23 ENCOUNTER — Other Ambulatory Visit: Payer: Self-pay | Admitting: Physician Assistant

## 2018-11-23 DIAGNOSIS — R42 Dizziness and giddiness: Secondary | ICD-10-CM

## 2018-11-23 DIAGNOSIS — H538 Other visual disturbances: Secondary | ICD-10-CM

## 2018-12-02 ENCOUNTER — Ambulatory Visit
Admission: RE | Admit: 2018-12-02 | Discharge: 2018-12-02 | Disposition: A | Discharge status: Home / Self Care | Payer: Medicare HMO | Source: Ambulatory Visit | Attending: Physician Assistant | Admitting: Physician Assistant

## 2018-12-02 DIAGNOSIS — R42 Dizziness and giddiness: Secondary | ICD-10-CM

## 2018-12-02 DIAGNOSIS — H538 Other visual disturbances: Secondary | ICD-10-CM
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
## 2019-03-12 DIAGNOSIS — M7072 Other bursitis of hip, left hip: Secondary | ICD-10-CM | POA: Insufficient documentation

## 2019-03-16 DIAGNOSIS — R42 Dizziness and giddiness: Secondary | ICD-10-CM | POA: Insufficient documentation

## 2019-03-16 DIAGNOSIS — H903 Sensorineural hearing loss, bilateral: Secondary | ICD-10-CM | POA: Insufficient documentation

## 2019-06-06 ENCOUNTER — Ambulatory Visit (INDEPENDENT_AMBULATORY_CARE_PROVIDER_SITE_OTHER): Payer: Medicare HMO

## 2019-06-06 ENCOUNTER — Ambulatory Visit (INDEPENDENT_AMBULATORY_CARE_PROVIDER_SITE_OTHER): Payer: Medicare HMO | Admitting: Orthopaedic Surgery

## 2019-06-06 ENCOUNTER — Encounter: Payer: Self-pay | Admitting: Orthopaedic Surgery

## 2019-06-06 VITALS — BP 134/79 | HR 78 | Ht 66.0 in | Wt 150.0 lb

## 2019-06-06 DIAGNOSIS — M7542 Impingement syndrome of left shoulder: Secondary | ICD-10-CM

## 2019-06-06 DIAGNOSIS — M545 Low back pain, unspecified: Secondary | ICD-10-CM

## 2019-06-06 DIAGNOSIS — G8929 Other chronic pain: Secondary | ICD-10-CM

## 2019-06-06 DIAGNOSIS — M25552 Pain in left hip: Secondary | ICD-10-CM

## 2019-06-06 DIAGNOSIS — M25512 Pain in left shoulder: Secondary | ICD-10-CM | POA: Diagnosis not present

## 2019-06-06 MED ORDER — METHYLPREDNISOLONE ACETATE 40 MG/ML IJ SUSP
40.0000 mg | INTRAMUSCULAR | Status: AC | PRN
  Administered 2019-06-06: 40 mg via INTRA_ARTICULAR

## 2019-06-06 MED ORDER — BUPIVACAINE HCL 0.25 % IJ SOLN
4.0000 mL | INTRAMUSCULAR | Status: AC | PRN
  Administered 2019-06-06: 4 mL via INTRA_ARTICULAR

## 2019-06-06 MED ORDER — LIDOCAINE HCL 1 % IJ SOLN
0.5000 mL | INTRAMUSCULAR | Status: AC | PRN
  Administered 2019-06-06: .5 mL

## 2019-06-06 NOTE — Progress Notes (Signed)
Office Visit Note   Patient: Jeanne Schwartz           Date of Birth: 1946-02-06           MRN: [id number] Visit Date: 06/06/2019              Requested by: Roger KillWilliams, Breejante J, PA-C [address] [place],  [place] [id number] PCP: Roger KillWilliams, Breejante J, PA-C   Assessment & Plan: Visit Diagnoses:  1. Chronic left-sided low back pain, unspecified whether sciatica present   2. Pain in left hip   3. Chronic left shoulder pain   4. Impingement syndrome of left shoulder     Plan: Injection performed left shoulder.  She got temporary relief.  If she has persistent problems or increased symptoms and injection not effective she can call about proceeding with an MRI scan of her left shoulder.  We reviewed x-ray findings.  She is happy the surgical result from her neck fusion and right rotator cuff repair with biceps tenodesis.  Follow-Up Instructions: Return if symptoms worsen or fail to improve.   Orders:  Orders Placed This Encounter  Procedures  . Large Joint Inj: L subacromial bursa  . XR Shoulder Left  . XR Lumbar Spine 2-3 Views  . XR Pelvis 1-2 Views   No orders of the defined types were placed in this encounter.     Procedures: Large Joint Inj: L subacromial bursa on 06/06/2019 10:38 AM Indications: pain Details: 22 G 1.5 in needle  Arthrogram: No  Medications: 4 mL bupivacaine 0.25 %; 40 mg methylPREDNISolone acetate 40 MG/ML; 0.5 mL lidocaine 1 % Outcome: tolerated well, no immediate complications Procedure, treatment alternatives, risks and benefits explained, specific risks discussed. Consent was given by the patient. Immediately prior to procedure a time out was called to verify the correct patient, procedure, equipment, support staff and site/side marked as required. Patient was prepped and draped in the usual sterile fashion.       Clinical Data: No additional findings.   Subjective: Chief Complaint  Patient presents with  . Left Shoulder - Pain   . Lower Back - Pain  . Left Hip - Pain    HPI 73 year old female patient returns post two-level cervical fusion years ago solidly healed with some dysphonia which is stable does not tend to bother her.  Previous right rotator cuff repair in the past doing well and now with left shoulder acute pain pain with washing her hair outstretch reaching she has problems getting her arm up overhead and states "I think my rotator cuff is torn in its worse than mild the shoulder was".  On her opposite right side she had biceps tenodesis and rotator cuff repair.  He has some chronic back symptoms without claudication symptoms or leg weakness.  Review of Systems positive for previous cervical fusion, rotator cuff repair right.  History of gastritis, asymptomatic currently.  She denies hand numbness no bowel bladder symptoms.  Rest 14 point review of systems negative.   Objective: Vital Signs: BP 134/79   Pulse 78   Ht 5\' 6"  (1.676 m)   Wt 150 lb (68 kg)   BMI 24.21 kg/m   Physical Exam Constitutional:      Appearance: She is well-developed.  HENT:     Head: Normocephalic.     Right Ear: External ear normal.     Left Ear: External ear normal.  Eyes:     Pupils: Pupils are equal, round, and reactive to light.  Neck:     Thyroid: No thyromegaly.     Trachea: No tracheal deviation.  Cardiovascular:     Rate and Rhythm: Normal rate.  Pulmonary:     Effort: Pulmonary effort is normal.  Abdominal:     Palpations: Abdomen is soft.  Skin:    General: Skin is warm and dry.  Neurological:     Mental Status: She is alert and oriented to person, place, and time.  Psychiatric:        Behavior: Behavior normal.     Ortho Exam patient has positive impingement left shoulder.  Long head of the biceps is moderately tender no distal biceps migration.  Abduction limited to 100 degrees.  Upper extremity reflexes are 2+ well-healed cervical incision no brachial plexus tenderness negative Spurling.   Gastrocsoleus, anterior tib strength and knee and ankle jerk are normal to testing.  Specialty Comments:  No specialty comments available.  Imaging: Xr Lumbar Spine 2-3 Views  Result Date: 06/06/2019 AP lateral lumbar x-rays are obtained and reviewed.  Patient has right thoracolumbar curve with degenerative spondylitic changes of the thoracic spine.  Disc space narrowing at L5-S1 no spondylolisthesis negative for acute fracture.  Facet arthropathy most pronounced at L5-S1. Impression: Thoracolumbar curve with more pronounced L5-S1 disc base narrowing and facet spurring.  Xr Pelvis 1-2 Views  Result Date: 06/06/2019 AP pelvis x-rays obtained and reviewed.  2 pelvis clips are noted from previous surgery.  Hip joints and pelvis is normal no sacroiliac degenerative changes. Impression: Normal pelvis and AP hip x-ray.  Xr Shoulder Left  Result Date: 06/06/2019 Three-view x-ray left shoulder obtained and reviewed.  This shows normal glenohumeral joint and acromioclavicular joint.  No soft tissue calcification.  Negative for acute changes. Impression: Normal left shoulder radiographs.    PMFS History: Patient Active Problem List   Diagnosis Date Noted  . Impingement syndrome of left shoulder 06/06/2019  . Cervical spondylosis 10/02/2011  . ARTHRITIS 02/27/2008  . GASTRITIS 01/16/2008  . ABDOMINAL PAIN, EPIGASTRIC 01/15/2008   Past Medical History:  Diagnosis Date  . GERD (gastroesophageal reflux disease)    TAKES PRILOSEC  . Headache(784.0)    SINCE NECK  THING  . High cholesterol    TAKES LIPITOR  . PONV (postoperative nausea and vomiting)    NOTHING IN THE LAST COUPLE YRS  . Rotator cuff tear, right    JUST FOUND OUT THIS WEEK    Family History  Problem Relation Age of Onset  . Heart attack Mother   . Leukemia Father   . Diabetes Maternal Grandmother     Past Surgical History:  Procedure Laterality Date  . ABDOMINAL HYSTERECTOMY    . ANTERIOR CERVICAL DECOMP/DISCECTOMY  FUSION  10/01/2011   Procedure: ANTERIOR CERVICAL DECOMPRESSION/DISCECTOMY FUSION 2 LEVELS;  Surgeon: Marybelle Killings;  Location: [facility];  Service: Orthopedics;  Laterality: N/A;  C5-6, C6-7 Anterior Cervical Discectomy and Fusion, Allograft and Plate  . BLEPHAROPLASTY     2001  . BREAST SURGERY     10 YRS  BENIGN  . FRACTURE SURGERY     LEFT  WRIST  . LEFT WRIST     METAL PLATE   . SHOULDER SURGERY Right    arthroscopy and rotator cuff repair  . TUBAL LIGATION     Social History   Occupational History  . Not on file  Tobacco Use  . Smoking status: Never Smoker  . Smokeless tobacco: Never Used  Substance and Sexual Activity  . Alcohol use: No  .  Drug use: No  . Sexual activity: Not on file       

## 2019-08-28 ENCOUNTER — Other Ambulatory Visit: Payer: Self-pay

## 2019-08-28 ENCOUNTER — Encounter: Payer: Self-pay | Admitting: Orthopaedic Surgery

## 2019-08-28 ENCOUNTER — Ambulatory Visit (INDEPENDENT_AMBULATORY_CARE_PROVIDER_SITE_OTHER): Payer: Medicare HMO | Admitting: Orthopaedic Surgery

## 2019-08-28 VITALS — Ht 66.0 in | Wt 148.0 lb

## 2019-08-28 DIAGNOSIS — M7062 Trochanteric bursitis, left hip: Secondary | ICD-10-CM | POA: Diagnosis not present

## 2019-08-28 MED ORDER — BUPIVACAINE HCL 0.25 % IJ SOLN
6.0000 mL | INTRAMUSCULAR | Status: AC | PRN
  Administered 2019-08-28: 6 mL via INTRA_ARTICULAR

## 2019-08-28 MED ORDER — LIDOCAINE HCL 1 % IJ SOLN
3.0000 mL | INTRAMUSCULAR | Status: AC | PRN
  Administered 2019-08-28: 3 mL

## 2019-08-28 MED ORDER — METHYLPREDNISOLONE ACETATE 40 MG/ML IJ SUSP
80.0000 mg | INTRAMUSCULAR | Status: AC | PRN
  Administered 2019-08-28: 80 mg via INTRA_ARTICULAR

## 2019-08-28 NOTE — Progress Notes (Signed)
Office Visit Note   Patient: Jeanne Schwartz           Date of Birth: 1946-08-17           MRN: [id number] Visit Date: 08/28/2019              Requested by: Heywood Bene, PA-C [address] [place],  [place] [address] PCP: Heywood Bene, PA-C   Assessment & Plan: Visit Diagnoses:  1. Greater trochanteric bursitis, left     Plan: Today elected to try a left greater trochanter bursa injection.  After patient consent left lateral hip was prepped with Betadine and greater trochanter bursa Marcaine/Depo-Medrol 6:2 injection performed.  Tolerated well the complication.  Patient had excellent relief of her lateral hip pain with Marcaine in place.  She will follow up with Dr. Lorin Mercy in 6 weeks for recheck.  Return sooner needed.  Patient may need further imaging studies of her lumbar spine.  Follow-Up Instructions: Return in about 6 weeks (around 10/09/2019) for With Dr. Lorin Mercy.   Orders:  Orders Placed This Encounter  Procedures  . Large Joint Inj   No orders of the defined types were placed in this encounter.     Procedures: Large Joint Inj: L greater trochanter on 08/28/2019 4:24 PM Details: 22 G 3.5 in needle, lateral approach Medications: 3 mL lidocaine 1 %; 6 mL bupivacaine 0.25 %; 80 mg methylPREDNISolone acetate 40 MG/ML Consent was given by the patient. Patient was prepped and draped in the usual sterile fashion.       Clinical Data: No additional findings.   Subjective: Chief Complaint  Patient presents with  . Left Hip - Pain    HPI  Review of Systems No current cardiac, pulm, gi, gu issues.  Objective: Vital Signs: Ht 5\' 6"  (1.676 m)   Wt 148 lb (67.1 kg)   BMI 23.89 kg/m   Physical Exam HENT:     Head: Normocephalic and atraumatic.  Eyes:     Extraocular Movements: Extraocular movements intact.     Pupils: Pupils are equal, round, and reactive to light.  Pulmonary:     Effort: No respiratory distress.  Musculoskeletal:    Comments: Negative logroll bilateral hips.  Patient has marked tenderness over the left hip greater trochanter bursa.  Tenderness extends down the course of the IT band.  Skin:    General: Skin is dry.  Neurological:     General: No focal deficit present.     Mental Status: She is alert and oriented to person, place, and time.     Ortho Exam  Specialty Comments:  No specialty comments available.  Imaging: No results found.   PMFS History: Patient Active Problem List   Diagnosis Date Noted  . Impingement syndrome of left shoulder 06/06/2019  . Cervical spondylosis 10/02/2011  . ARTHRITIS 02/27/2008  . GASTRITIS 01/16/2008  . ABDOMINAL PAIN, EPIGASTRIC 01/15/2008   Past Medical History:  Diagnosis Date  . GERD (gastroesophageal reflux disease)    TAKES PRILOSEC  . Headache(784.0)    SINCE NECK  THING  . High cholesterol    TAKES LIPITOR  . PONV (postoperative nausea and vomiting)    NOTHING IN THE LAST COUPLE YRS  . Rotator cuff tear, right    JUST FOUND OUT THIS WEEK    Family History  Problem Relation Age of Onset  . Heart attack Mother   . Leukemia Father   . Diabetes Maternal Grandmother     Past  Surgical History:  Procedure Laterality Date  . ABDOMINAL HYSTERECTOMY    . ANTERIOR CERVICAL DECOMP/DISCECTOMY FUSION  10/01/2011   Procedure: ANTERIOR CERVICAL DECOMPRESSION/DISCECTOMY FUSION 2 LEVELS;  Surgeon: Eldred Manges;  Location: [facility];  Service: Orthopedics;  Laterality: N/A;  C5-6, C6-7 Anterior Cervical Discectomy and Fusion, Allograft and Plate  . BLEPHAROPLASTY     2001  . BREAST SURGERY     10 YRS  BENIGN  . FRACTURE SURGERY     LEFT  WRIST  . LEFT WRIST     METAL PLATE   . SHOULDER SURGERY Right    arthroscopy and rotator cuff repair  . TUBAL LIGATION     Social History   Occupational History  . Not on file  Tobacco Use  . Smoking status: Never Smoker  . Smokeless tobacco: Never Used  Substance and Sexual Activity  . Alcohol use: No   . Drug use: No  . Sexual activity: Not on file

## 2019-09-14 ENCOUNTER — Telehealth: Payer: Self-pay | Admitting: Orthopaedic Surgery

## 2019-09-14 NOTE — Telephone Encounter (Signed)
I called pt and made appt.

## 2019-09-14 NOTE — Telephone Encounter (Signed)
I called patient. She had left troch injected on 08/28/2019. This injection gave relief x 2 1/2 days, but the pain came back and is continuing to worsen. Patient states that she is now walking with a limp. She would like to know what you would recommend she do?

## 2019-09-14 NOTE — Telephone Encounter (Signed)
ROV thanks ucall

## 2019-09-14 NOTE — Telephone Encounter (Signed)
Patient called and requesting 2nd cortisone injection. Requesting call back. Patient phone number is [phone]

## 2019-09-18 ENCOUNTER — Ambulatory Visit: Payer: Medicare HMO | Admitting: Orthopaedic Surgery

## 2019-09-18 ENCOUNTER — Other Ambulatory Visit: Payer: Self-pay

## 2019-09-18 ENCOUNTER — Encounter: Payer: Self-pay | Admitting: Orthopaedic Surgery

## 2019-09-18 VITALS — BP 143/93 | HR 86 | Ht 66.0 in | Wt 148.0 lb

## 2019-09-18 DIAGNOSIS — M7062 Trochanteric bursitis, left hip: Secondary | ICD-10-CM | POA: Diagnosis not present

## 2019-09-18 MED ORDER — LIDOCAINE HCL 1 % IJ SOLN
0.5000 mL | INTRAMUSCULAR | Status: AC | PRN
  Administered 2019-09-18: 11:00:00 .5 mL

## 2019-09-18 MED ORDER — BUPIVACAINE HCL 0.5 % IJ SOLN
2.0000 mL | INTRAMUSCULAR | Status: AC | PRN
  Administered 2019-09-18: 11:00:00 2 mL via INTRA_ARTICULAR

## 2019-09-18 MED ORDER — METHYLPREDNISOLONE ACETATE 40 MG/ML IJ SUSP
40.0000 mg | INTRAMUSCULAR | Status: AC | PRN
  Administered 2019-09-18: 11:00:00 40 mg via INTRA_ARTICULAR

## 2019-09-18 NOTE — Progress Notes (Signed)
Office Visit Note   Patient: Jeanne Schwartz           Date of Birth: [birth year]           MRN: [id number] Visit Date: 09/18/2019              Requested by: Roger Kill, PA-C [address] [place],  [place] [address] PCP: Roger Kill, PA-C   Assessment & Plan: Visit Diagnoses:  1. Trochanteric bursitis, left hip     Plan: We discussed the trochanter bursitis probably related to her longstanding low back pain problems that she has.  She requests repeat injection which we performed.  I will check her back again in 6 weeks and she is having persistent problems we may need to consider imaging of her lumbar spine.  Follow-Up Instructions: Return in about 6 weeks (around 10/30/2019).   Orders:  Orders Placed This Encounter  Procedures  . Large Joint Inj: L greater trochanter   No orders of the defined types were placed in this encounter.     Procedures: Large Joint Inj: L greater trochanter on 09/18/2019 10:50 AM Medications: 0.5 mL lidocaine 1 %; 2 mL bupivacaine 0.5 %; 40 mg methylPREDNISolone acetate 40 MG/ML      Clinical Data: No additional findings.   Subjective: Chief Complaint  Patient presents with  . Left Hip - Pain    HPI 73 year old female returns she states the trochanteric injection gave her fantastic relief for 2 and half days then she is gradually had some recurrence of pain states she has about 50% relief.  She still walking with a limp.  Previous x-rays demonstrated no significant hip osteoarthritis.  Review of Systems reviewed updated unchanged from previous office visit November and August 2020.   Objective: Vital Signs: BP (!) 143/93   Pulse 86   Ht 5\' 6"  (1.676 m)   Wt 148 lb (67.1 kg)   BMI 23.89 kg/m   Physical Exam Constitutional:      Appearance: She is well-developed.  HENT:     Head: Normocephalic.     Right Ear: External ear normal.     Left Ear: External ear normal.  Eyes:     Pupils: Pupils are  equal, round, and reactive to light.  Neck:     Thyroid: No thyromegaly.     Trachea: No tracheal deviation.  Cardiovascular:     Rate and Rhythm: Normal rate.  Pulmonary:     Effort: Pulmonary effort is normal.  Abdominal:     Palpations: Abdomen is soft.  Skin:    General: Skin is warm and dry.  Neurological:     Mental Status: She is alert and oriented to person, place, and time.  Psychiatric:        Behavior: Behavior normal.     Ortho Exam positive Trendelenburg gait negative logroll of the hips no pain with extremes of internal or external rotation.  Distal pulses intact.  She has tenderness over the greater trochanter left hip.  Specialty Comments:  No specialty comments available.  Imaging: No results found.   PMFS History: Patient Active Problem List   Diagnosis Date Noted  . Trochanteric bursitis, left hip 09/18/2019  . Impingement syndrome of left shoulder 06/06/2019  . Cervical spondylosis 10/02/2011  . ARTHRITIS 02/27/2008  . GASTRITIS 01/16/2008  . ABDOMINAL PAIN, EPIGASTRIC 01/15/2008   Past Medical History:  Diagnosis Date  . GERD (gastroesophageal reflux disease)    TAKES PRILOSEC  .  Headache(784.0)    SINCE NECK  THING  . High cholesterol    TAKES LIPITOR  . PONV (postoperative nausea and vomiting)    NOTHING IN THE LAST COUPLE YRS  . Rotator cuff tear, right    JUST FOUND OUT THIS WEEK    Family History  Problem Relation Age of Onset  . Heart attack Mother   . Leukemia Father   . Diabetes Maternal Grandmother     Past Surgical History:  Procedure Laterality Date  . ABDOMINAL HYSTERECTOMY    . ANTERIOR CERVICAL DECOMP/DISCECTOMY FUSION  10/01/2011   Procedure: ANTERIOR CERVICAL DECOMPRESSION/DISCECTOMY FUSION 2 LEVELS;  Surgeon: Marybelle Killings;  Location: [facility];  Service: Orthopedics;  Laterality: N/A;  C5-6, C6-7 Anterior Cervical Discectomy and Fusion, Allograft and Plate  . BLEPHAROPLASTY     2001  . BREAST SURGERY     10 YRS  BENIGN   . FRACTURE SURGERY     LEFT  WRIST  . LEFT WRIST     METAL PLATE   . SHOULDER SURGERY Right    arthroscopy and rotator cuff repair  . TUBAL LIGATION     Social History   Occupational History  . Not on file  Tobacco Use  . Smoking status: Never Smoker  . Smokeless tobacco: Never Used  Substance and Sexual Activity  . Alcohol use: No  . Drug use: No  . Sexual activity: Not on file

## 2019-10-25 ENCOUNTER — Other Ambulatory Visit: Payer: Self-pay | Admitting: Gynecology

## 2019-10-25 DIAGNOSIS — N6320 Unspecified lump in the left breast, unspecified quadrant: Secondary | ICD-10-CM

## 2019-10-25 DIAGNOSIS — N6310 Unspecified lump in the right breast, unspecified quadrant: Secondary | ICD-10-CM

## 2019-10-30 ENCOUNTER — Other Ambulatory Visit: Payer: Medicare HMO

## 2019-11-08 ENCOUNTER — Ambulatory Visit
Admission: RE | Admit: 2019-11-08 | Discharge: 2019-11-08 | Disposition: A | Discharge status: Home / Self Care | Payer: Medicare HMO | Source: Ambulatory Visit | Attending: Gynecology | Admitting: Gynecology

## 2019-11-08 ENCOUNTER — Other Ambulatory Visit: Payer: Self-pay | Admitting: Gynecology

## 2019-11-08 ENCOUNTER — Other Ambulatory Visit: Payer: Self-pay

## 2019-11-08 DIAGNOSIS — N6310 Unspecified lump in the right breast, unspecified quadrant: Secondary | ICD-10-CM

## 2019-11-08 DIAGNOSIS — N6320 Unspecified lump in the left breast, unspecified quadrant: Secondary | ICD-10-CM

## 2019-11-08 IMAGING — MG DIGITAL DIAGNOSTIC BILAT W/ TOMO W/ CAD
8 of 15 series · 8 of 40 positions shown · non-contrast
Comparison: Previous exam(s).

CLINICAL DATA: Patient presents for palpable abnormalities within
the right and left breast.

EXAM:
DIGITAL DIAGNOSTIC BILATERAL MAMMOGRAM WITH CAD AND TOMO
ULTRASOUND BILATERAL BREAST

[L TAN synth-2D]
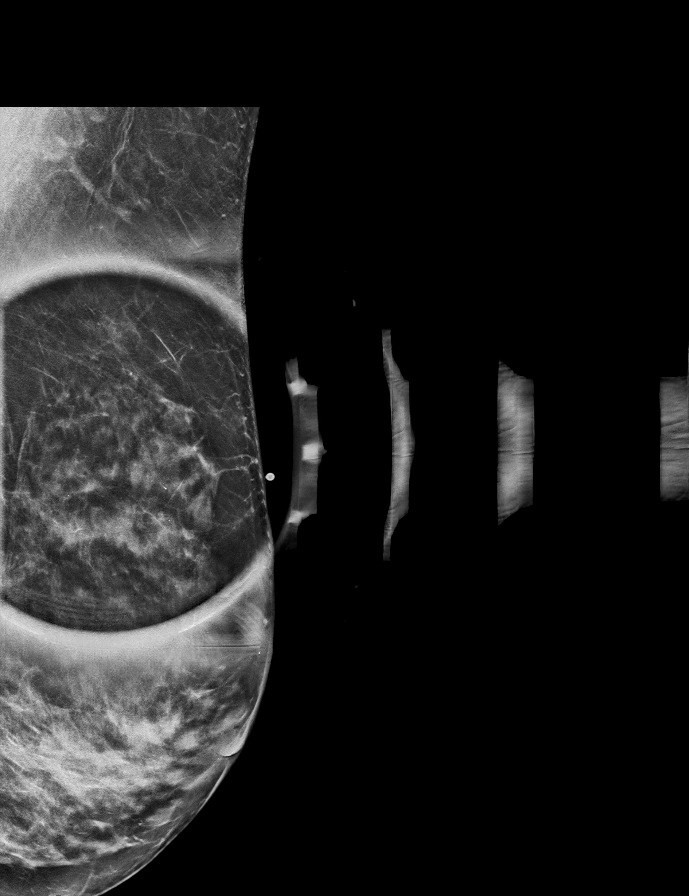

[L CC synth-2D]
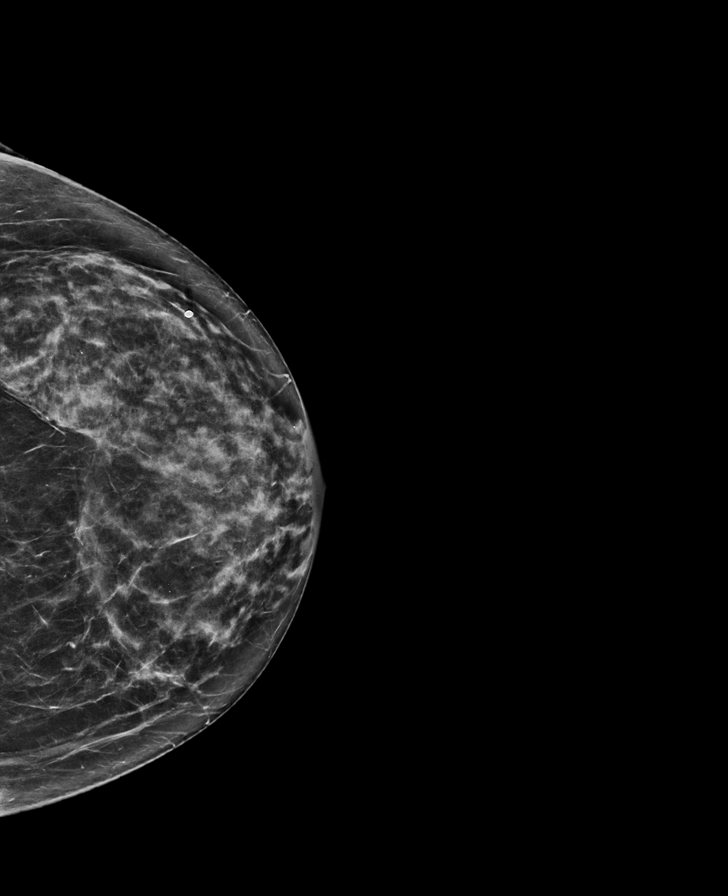

[R CC synth-2D]
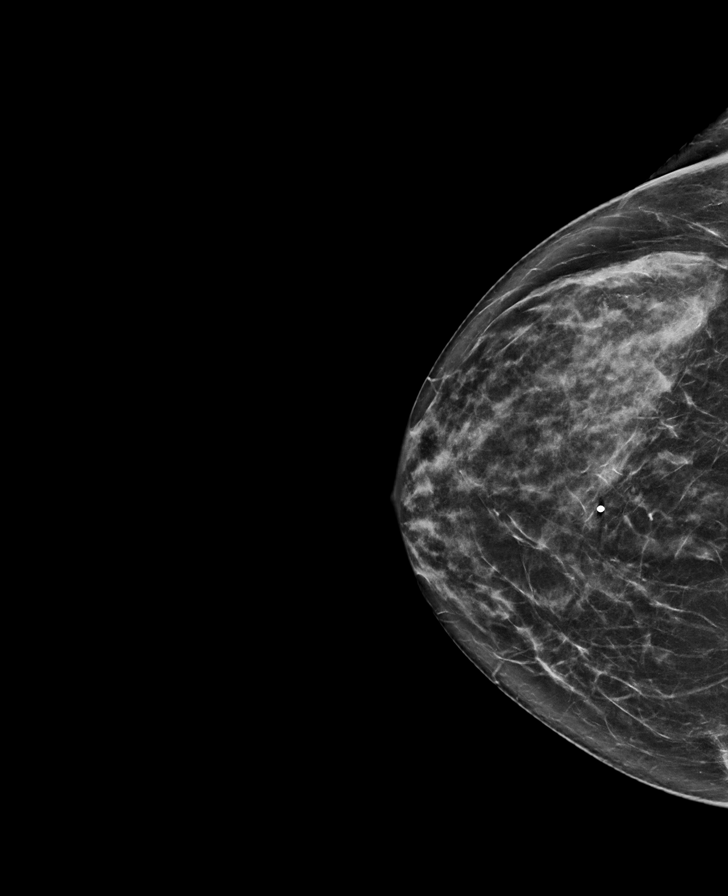

[R TAN synth-2D]
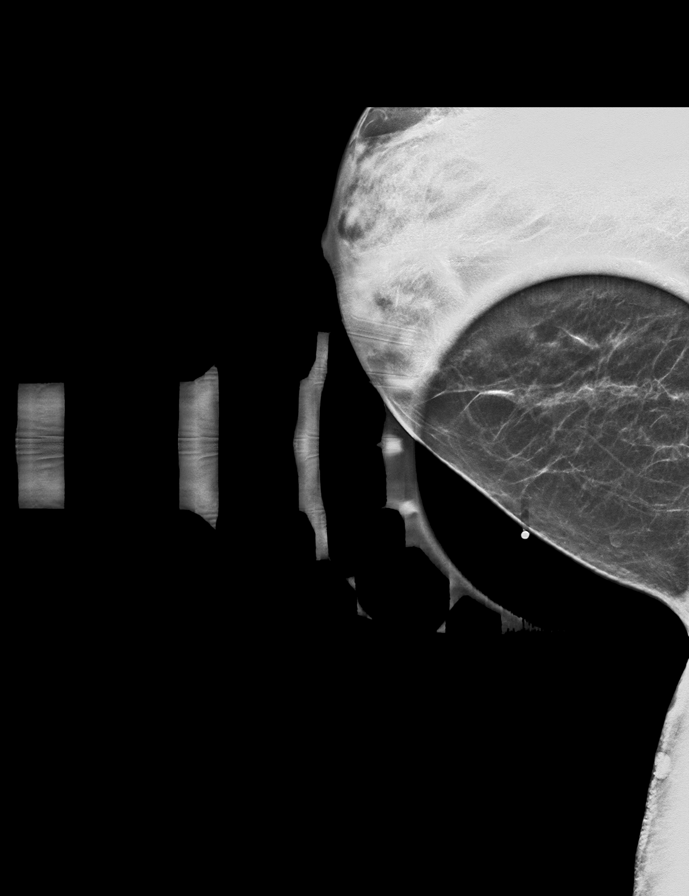

[L MLO synth-2D]
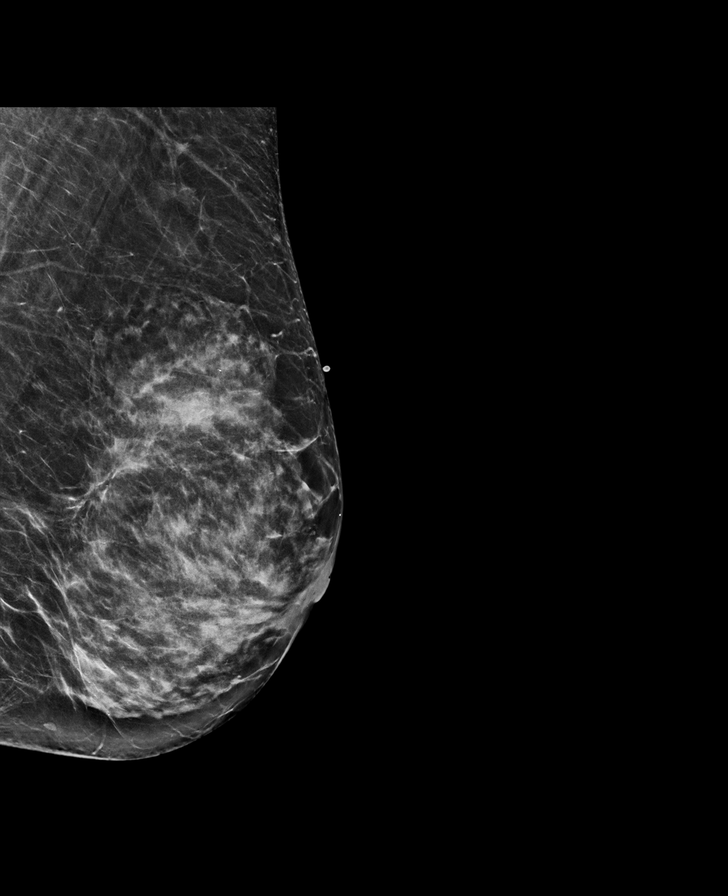

[L ML synth-2D]
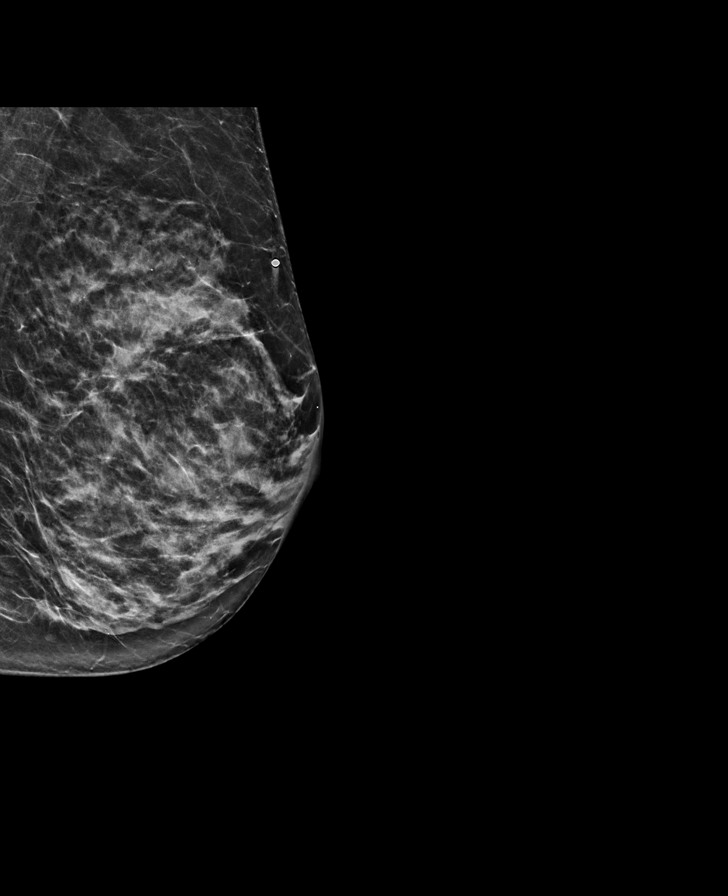

[R MLO synth-2D]
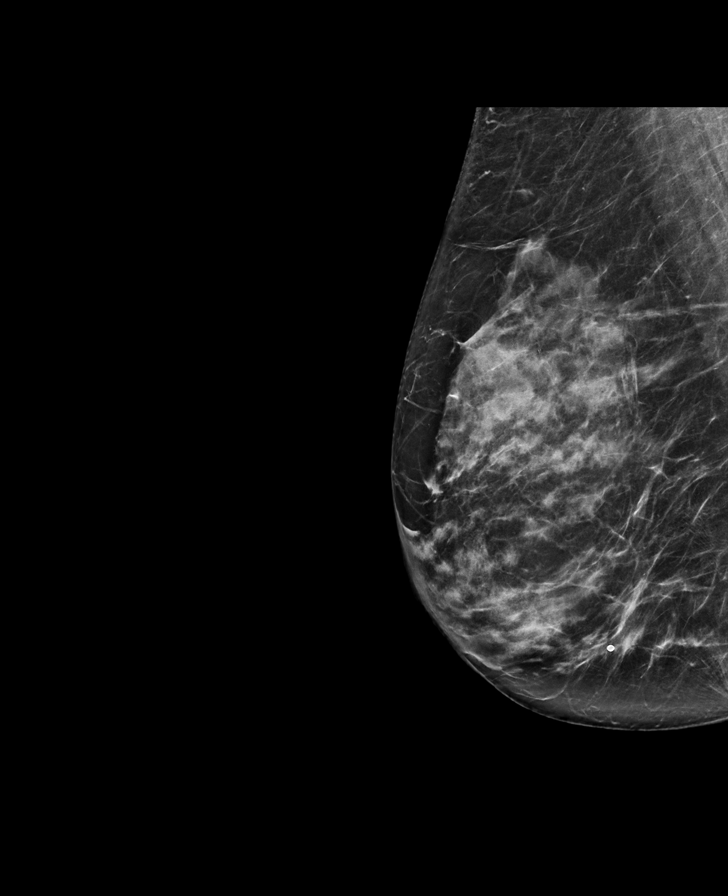

[R MLO tomo · tomo slice 53/78.0]
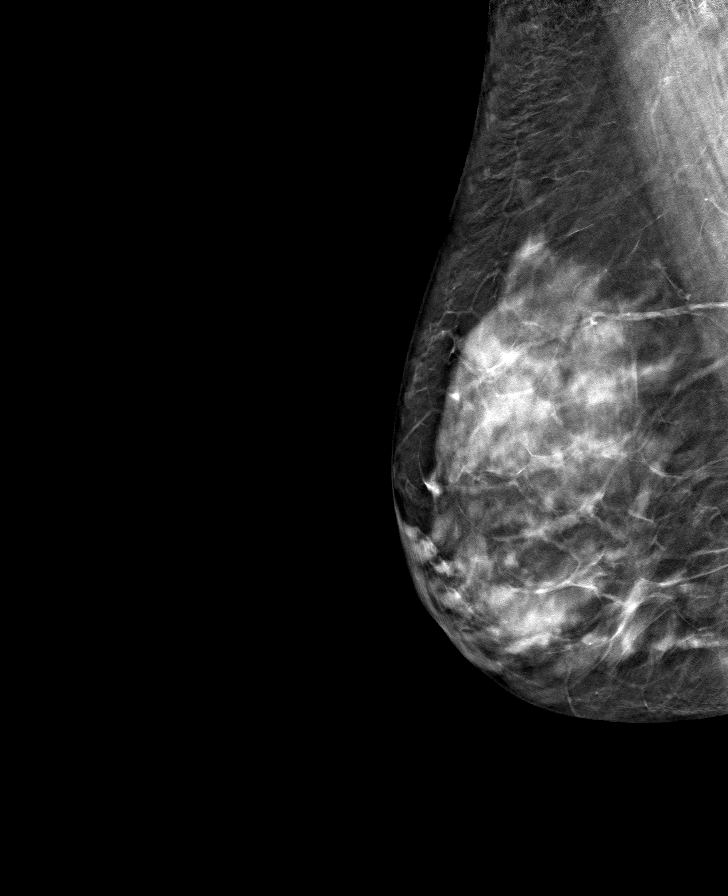

[8 of 40 positions shown; findings below may reference images not displayed]

ACR Breast Density Category c: The breast tissue is heterogeneously
dense, which may obscure small masses.
FINDINGS: No concerning masses, calcifications or distortion identified within
the right breast.

Within the upper outer left breast middle depth underlying the
palpable marker there is a focal area of parenchymal density. This
partially effaces with additional imaging and may represent dense
fibroglandular tissue. No additional findings identified within the
left breast.

Mammographic images were processed with CAD.

On physical exam, a small lobular mass is palpated within the
upper-outer left breast.

Targeted ultrasound is performed, showing normal tissue within the
right breast 6 o'clock position 6 cm from nipple at the patient
reported site of palpable concern.

Within the left breast 1 o'clock position 4 cm from the nipple there
is a focal masslike area with central and posterior acoustic
shadowing. This is accentuated with harmonic imaging. This
corresponds with the patient's area of palpable concern.

No left axillary adenopathy.
IMPRESSION: At the patient's area of palpable concern within the left breast 1
o'clock position 4 cm from nipple there is a focal area with
posterior acoustic shadowing. While this may be secondary to the
dense fibroglandular tissue. Underlying process such as the
possibility of a lobular carcinoma cannot be entirely excluded.

RECOMMENDATION:
Ultrasound-guided core needle biopsy of the left breast 1 o'clock
position 4 cm from nipple at the patient reported site of palpable
concern where there is focal dense tissue with central and posterior
acoustic shadowing.

I have discussed the findings and recommendations with the patient.
If applicable, a reminder letter will be sent to the patient
regarding the next appointment.

BI-RADS CATEGORY  4: Suspicious.

## 2019-11-08 IMAGING — US US BREAST*L* LIMITED INC AXILLA
1 series · 8 of 8 positions shown · non-contrast
Comparison: Previous exam(s).

CLINICAL DATA: Patient presents for palpable abnormalities within
the right and left breast.

EXAM:
DIGITAL DIAGNOSTIC BILATERAL MAMMOGRAM WITH CAD AND TOMO
ULTRASOUND BILATERAL BREAST

[Series 1: us breast*left* limited inc axilla · 0.07mm/px · 8 of 8 slices shown]
[im 1/8]
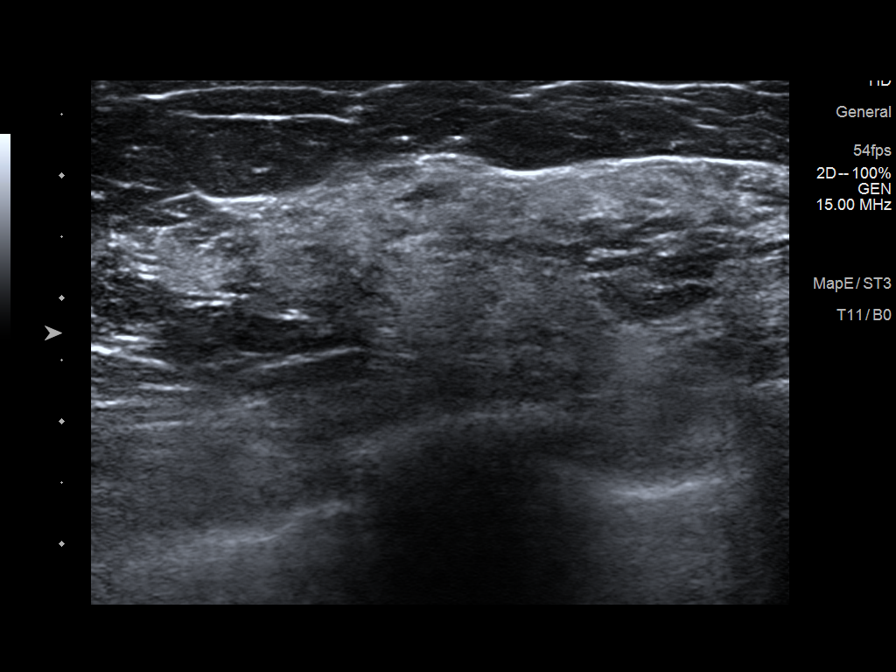
[im 2/8]
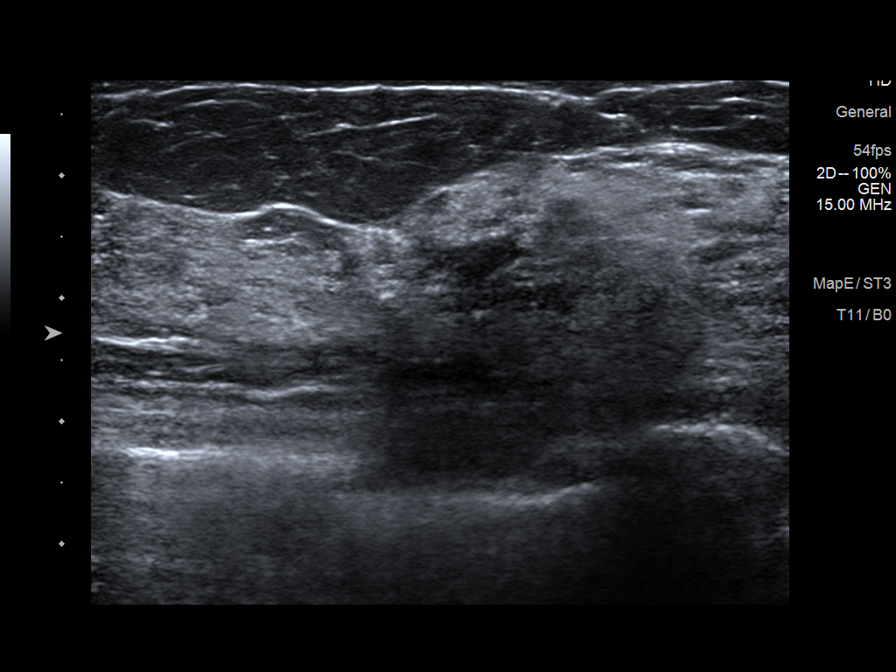
[im 3/8]
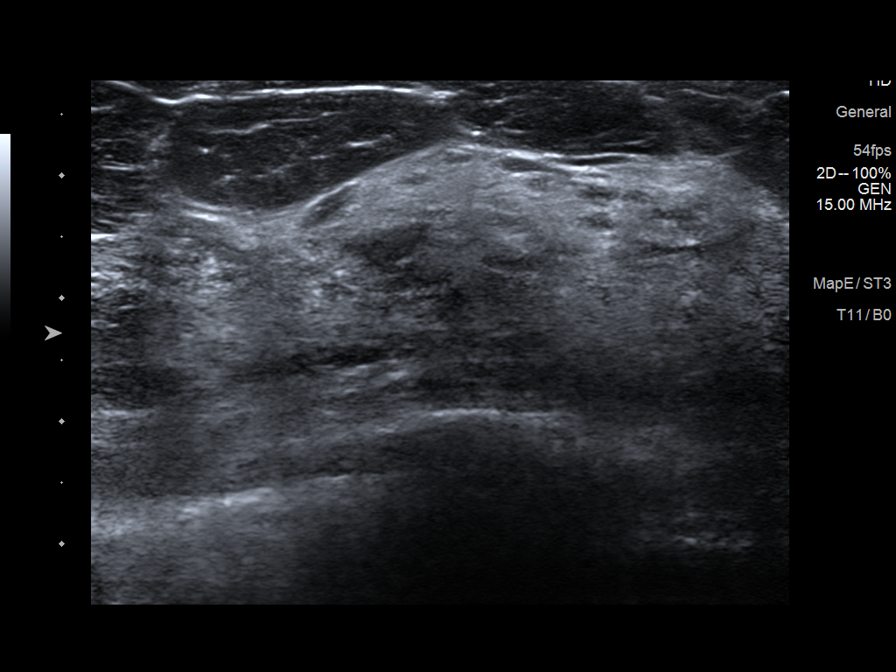
[im 4/8]
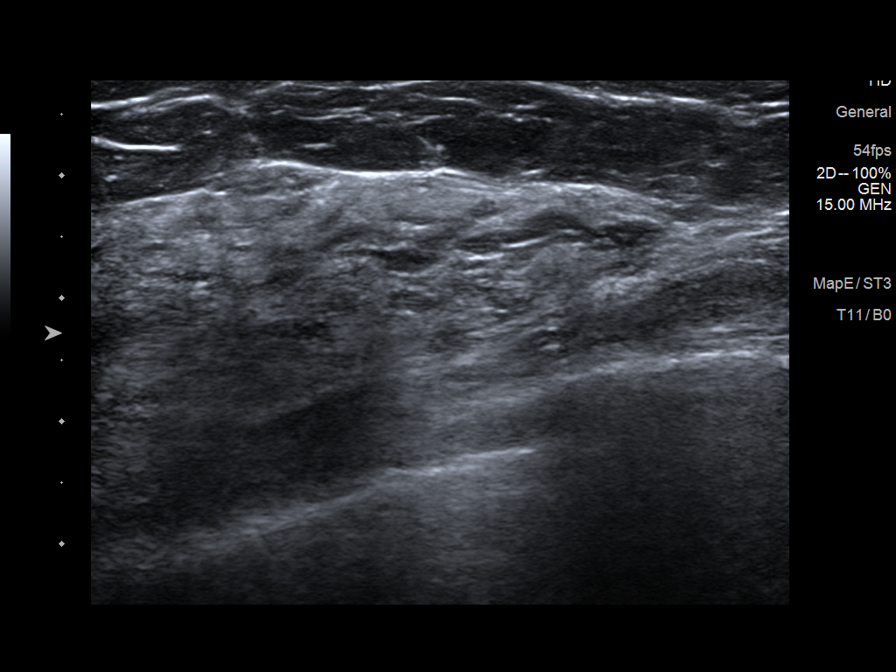
[im 5/8]
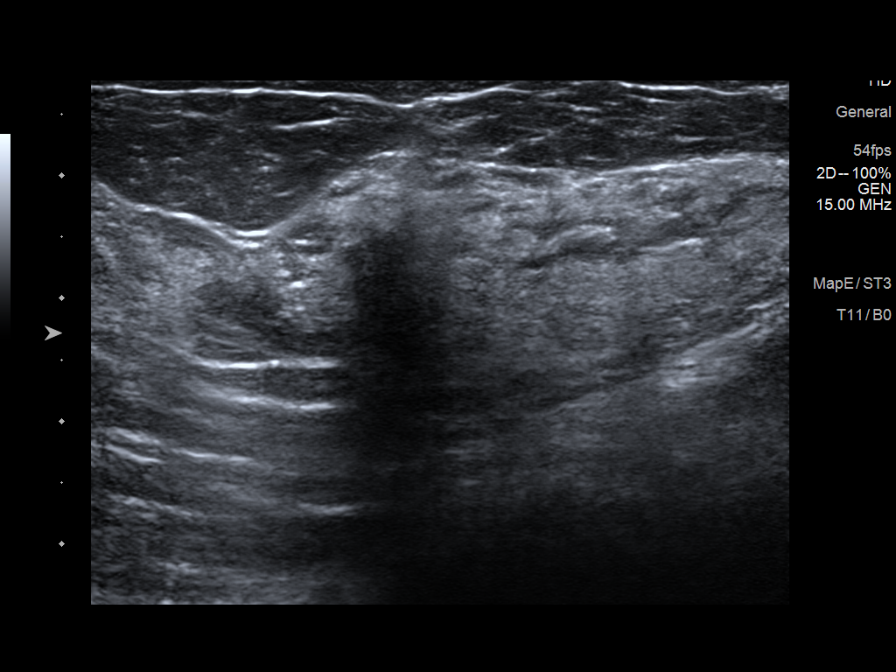
[im 6/8]
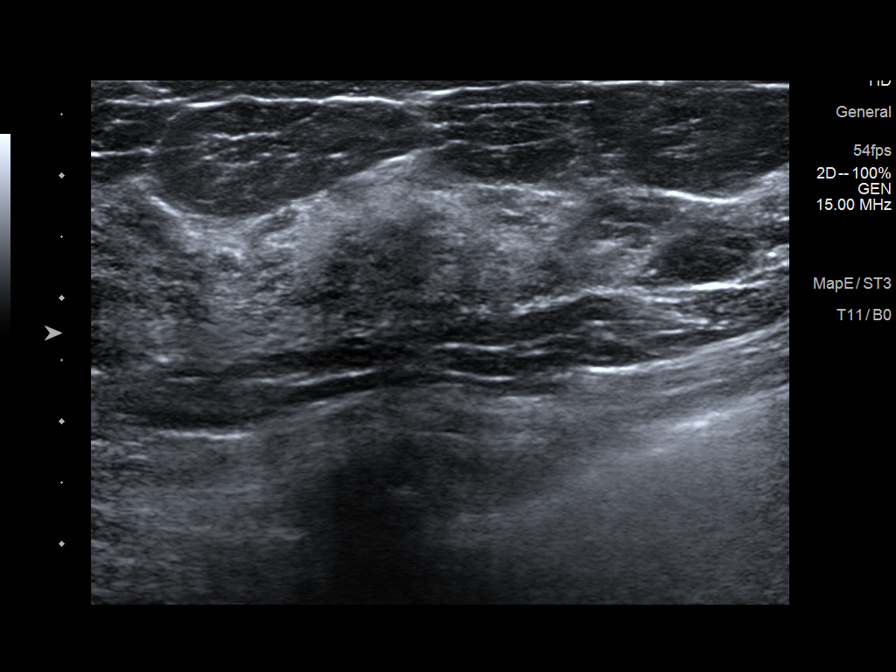
[im 7/8]
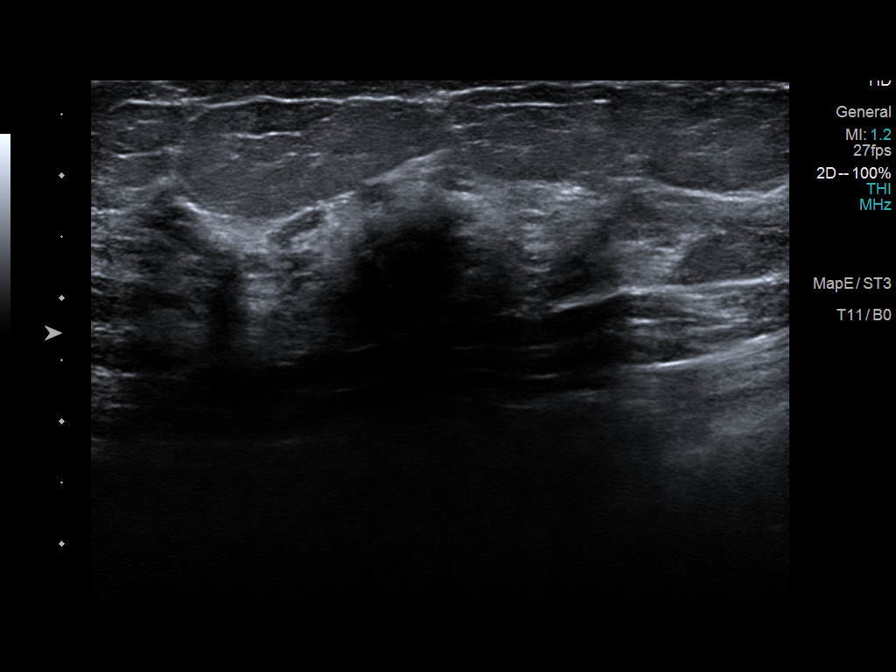
[im 8/8]
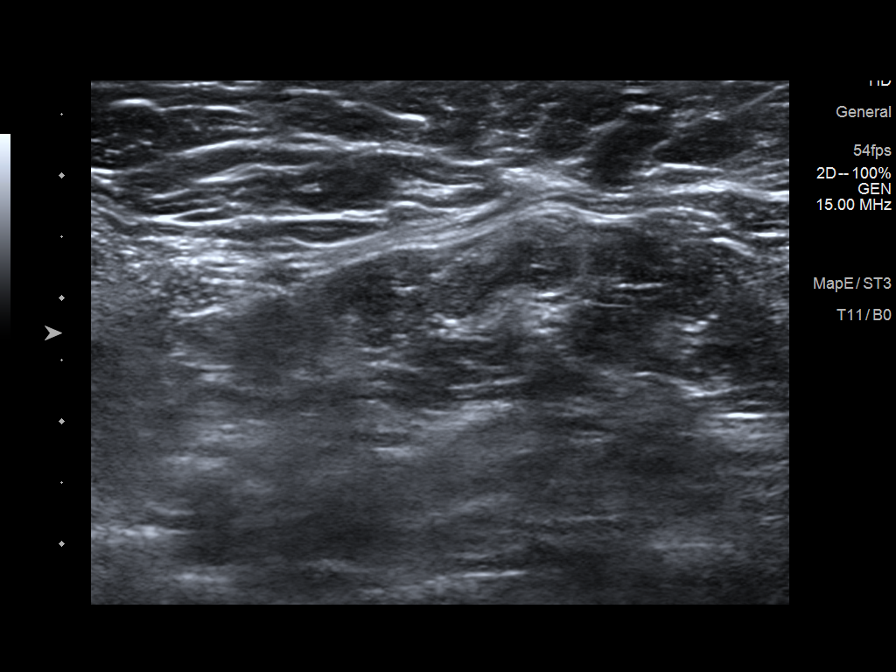

[8 of 8 positions shown; findings below may reference images not displayed]

ACR Breast Density Category c: The breast tissue is heterogeneously
dense, which may obscure small masses.
FINDINGS: No concerning masses, calcifications or distortion identified within
the right breast.

Within the upper outer left breast middle depth underlying the
palpable marker there is a focal area of parenchymal density. This
partially effaces with additional imaging and may represent dense
fibroglandular tissue. No additional findings identified within the
left breast.

Mammographic images were processed with CAD.

On physical exam, a small lobular mass is palpated within the
upper-outer left breast.

Targeted ultrasound is performed, showing normal tissue within the
right breast 6 o'clock position 6 cm from nipple at the patient
reported site of palpable concern.

Within the left breast 1 o'clock position 4 cm from the nipple there
is a focal masslike area with central and posterior acoustic
shadowing. This is accentuated with harmonic imaging. This
corresponds with the patient's area of palpable concern.

No left axillary adenopathy.
IMPRESSION: At the patient's area of palpable concern within the left breast 1
o'clock position 4 cm from nipple there is a focal area with
posterior acoustic shadowing. While this may be secondary to the
dense fibroglandular tissue. Underlying process such as the
possibility of a lobular carcinoma cannot be entirely excluded.

RECOMMENDATION:
Ultrasound-guided core needle biopsy of the left breast 1 o'clock
position 4 cm from nipple at the patient reported site of palpable
concern where there is focal dense tissue with central and posterior
acoustic shadowing.

I have discussed the findings and recommendations with the patient.
If applicable, a reminder letter will be sent to the patient
regarding the next appointment.

BI-RADS CATEGORY  4: Suspicious.

## 2019-11-20 ENCOUNTER — Ambulatory Visit
Admission: RE | Admit: 2019-11-20 | Discharge: 2019-11-20 | Disposition: A | Discharge status: Home / Self Care | Payer: Medicare HMO | Source: Ambulatory Visit | Attending: Gynecology | Admitting: Gynecology

## 2019-11-20 ENCOUNTER — Other Ambulatory Visit: Payer: Self-pay

## 2019-11-20 DIAGNOSIS — N6320 Unspecified lump in the left breast, unspecified quadrant: Secondary | ICD-10-CM

## 2019-11-20 DIAGNOSIS — N6310 Unspecified lump in the right breast, unspecified quadrant: Secondary | ICD-10-CM

## 2019-11-20 HISTORY — PX: BREAST BIOPSY: SHX20

## 2019-11-20 IMAGING — US US BREAST BX W LOC DEV 1ST LESION IMG BX SPEC US GUIDE*L*
1 series · 12 of 16 positions shown · non-contrast
Comparison: Previous exam(s).
COMPARISON: Previous exam(s).

Addendum:
CLINICAL DATA: Patient presents for ultrasound-guided core biopsy
of palpable mass in the 1 o'clock location of the LEFT breast.

EXAM:
ULTRASOUND GUIDED LEFT BREAST CORE NEEDLE BIOPSY

[Series 1: us breast bx w loc dev 1st lesion img bx spec us g · 0.07mm/px · 12 of 16 slices shown]
[im 1/16]
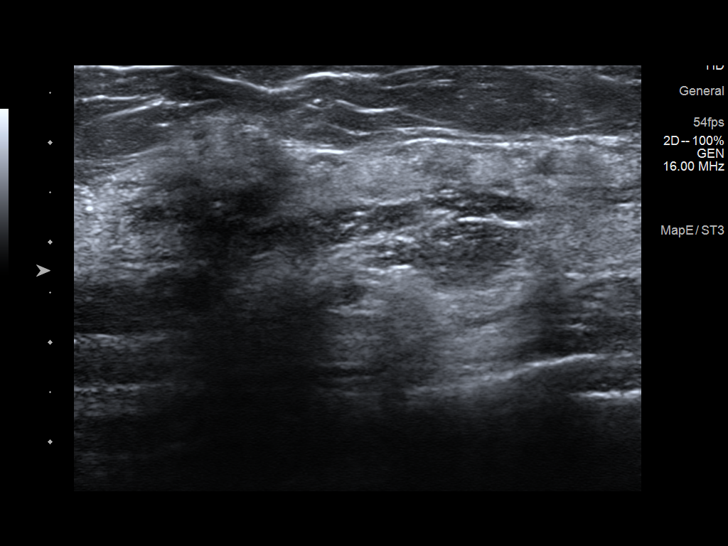
[im 3/16]
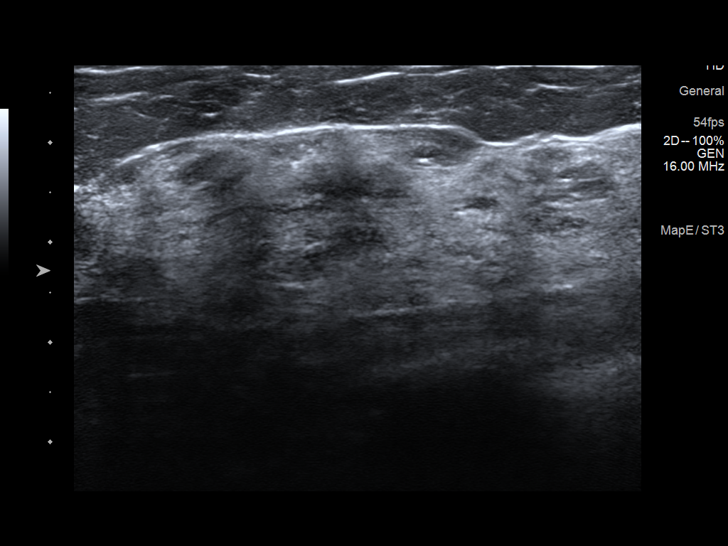
[im 4/16]
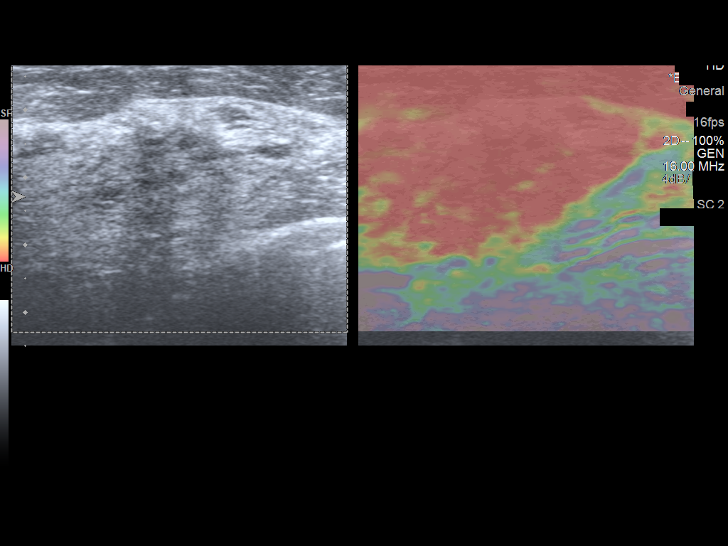
[im 5/16]
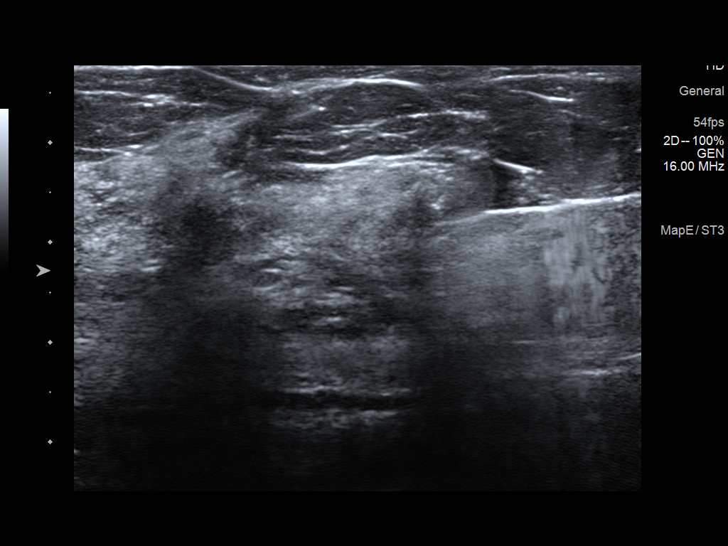
[im 7/16]
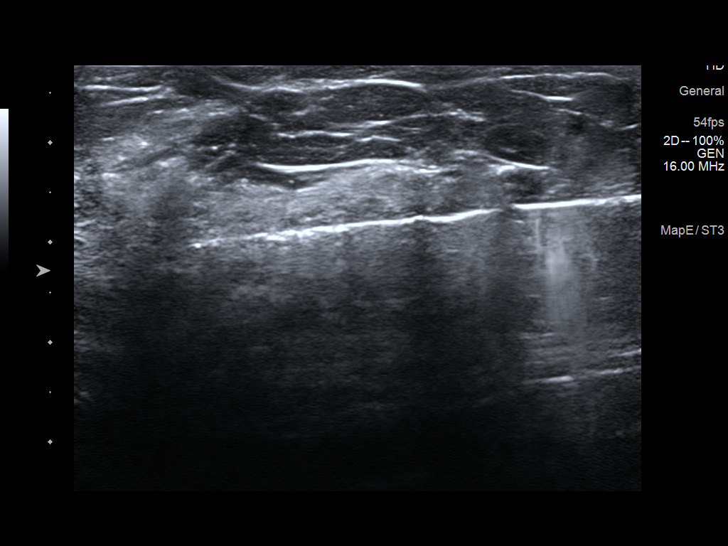
[im 8/16]
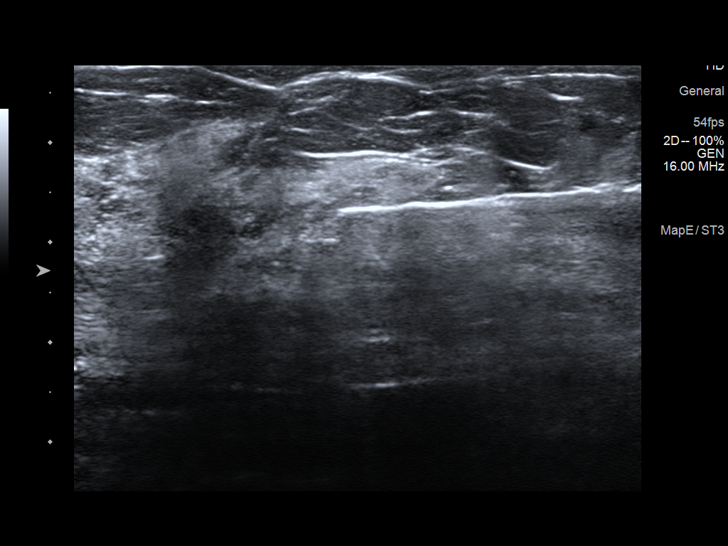
[im 9/16]
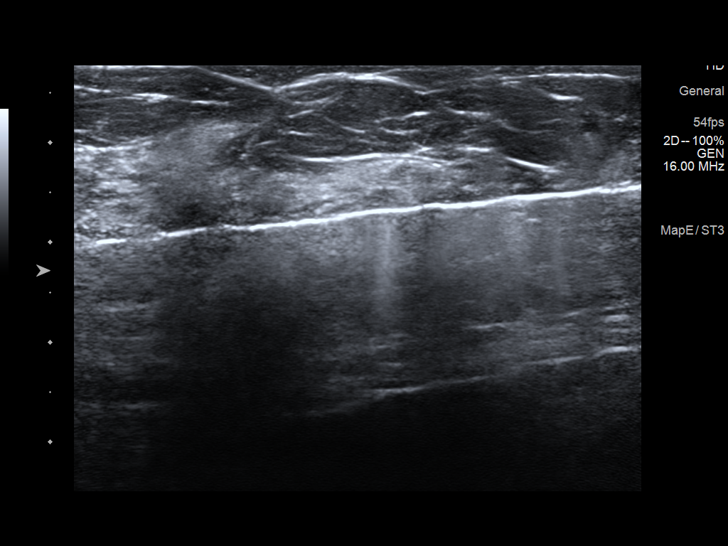
[im 11/16]
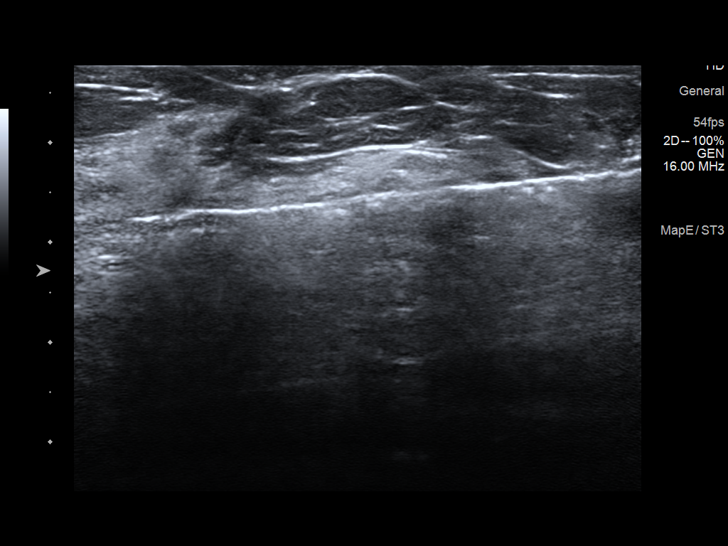
[im 12/16]
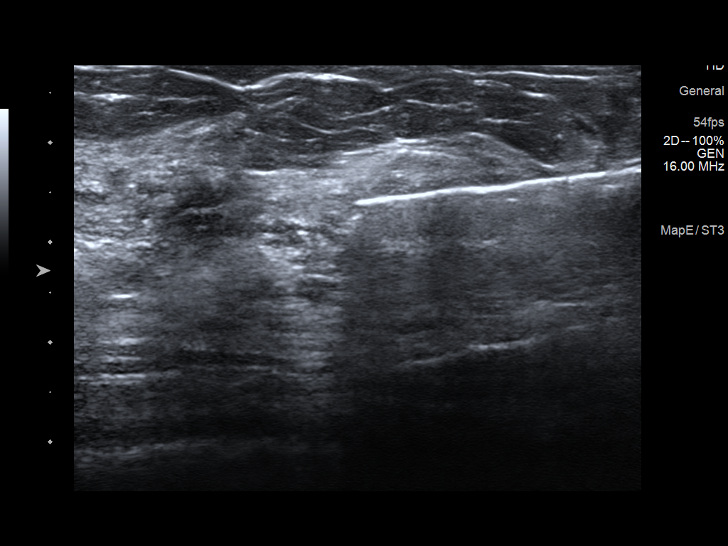
[im 13/16]
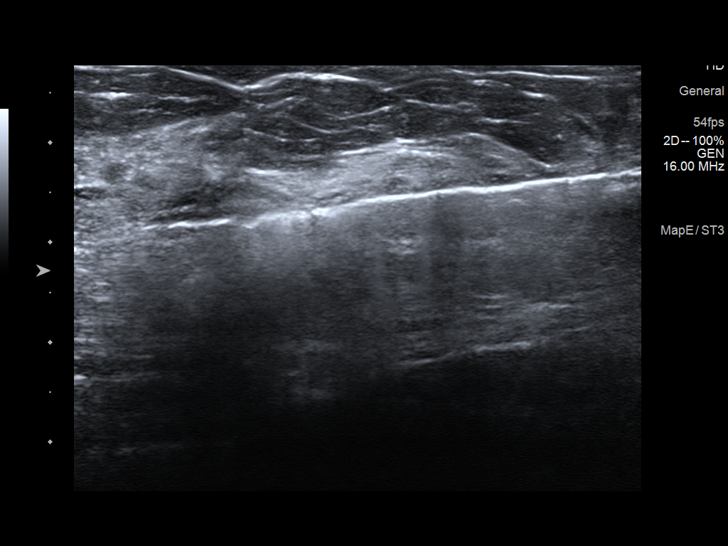
[im 15/16]
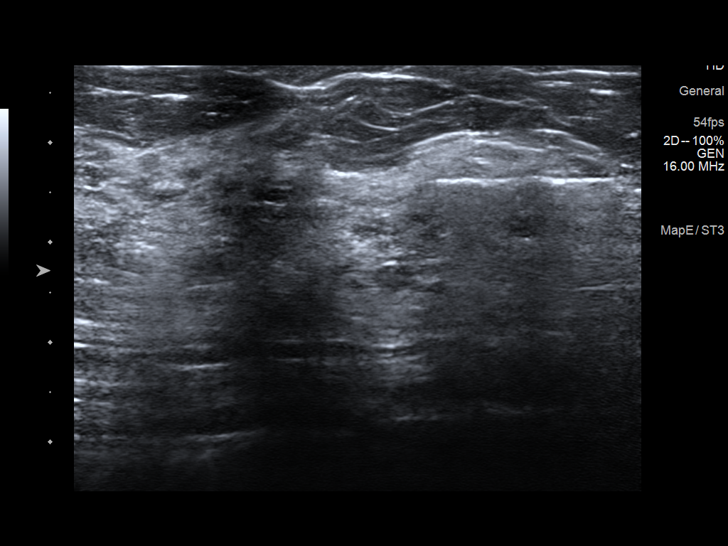
[im 16/16]
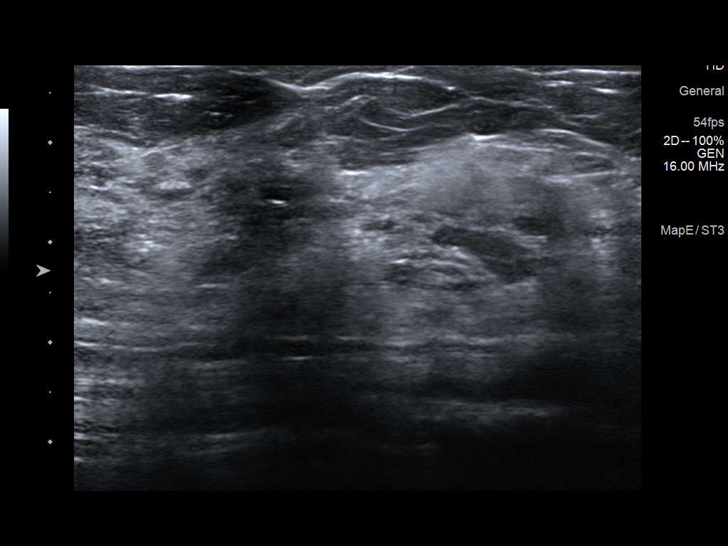

[12 of 16 positions shown; findings below may reference images not displayed]



Lesion quadrant: UPPER-OUTER QUADRANT LEFT breast

Using sterile technique and 1% Lidocaine as local anesthetic, under
direct ultrasound visualization, a 12 gauge Brack Tiger device was
used to perform biopsy of palpable mass in the 1 o'clock location
LEFT breast 4 centimeters from the nipple using a LATERAL to MEDIAL
approach. At the conclusion of the procedure ribbon shaped tissue
marker clip was deployed into the biopsy cavity. Follow up 2 view
mammogram was performed and dictated separately.
IMPRESSION: Ultrasound guided biopsy of LEFT breast mass. No apparent
complications.

ADDENDUM:
Pathology revealed FIBROCYSTIC CHANGE TO INCLUDE A PREDOMINANCE OF
STROMAL FIBROSIS of the Left breast, upper outer quadrant. This was
found to be concordant by Dr. Axilaw Morkee.

Pathology results were discussed with the patient by telephone. The
patient reported doing well after the biopsy with tenderness at the
site. Post biopsy instructions and care were reviewed and questions
were answered. The patient was encouraged to call The [REDACTED]

The patient was asked to return for Left diagnostic mammography and
ultrasound in 6 months and informed a reminder notice would be sent
regarding this appointment.

Pathology results reported by Eilleg Nagap Aicedreb, RN on 11/21/2019.



Lesion quadrant: UPPER-OUTER QUADRANT LEFT breast

Using sterile technique and 1% Lidocaine as local anesthetic, under
direct ultrasound visualization, a 12 gauge Brack Tiger device was
used to perform biopsy of palpable mass in the 1 o'clock location
LEFT breast 4 centimeters from the nipple using a LATERAL to MEDIAL
approach. At the conclusion of the procedure ribbon shaped tissue
marker clip was deployed into the biopsy cavity. Follow up 2 view
mammogram was performed and dictated separately.
IMPRESSION: Ultrasound guided biopsy of LEFT breast mass. No apparent
complications.

## 2019-11-20 IMAGING — MG MM BREAST LOCALIZATION CLIP
4 series · 4 of 12 positions shown · non-contrast
Comparison: Previous exam(s).

CLINICAL DATA: Status post ultrasound-guided core biopsy of
palpable mass in the 1 o'clock location of the LEFT breast.

EXAM:
DIAGNOSTIC LEFT MAMMOGRAM POST ULTRASOUND BIOPSY

[L CC synth-2D]
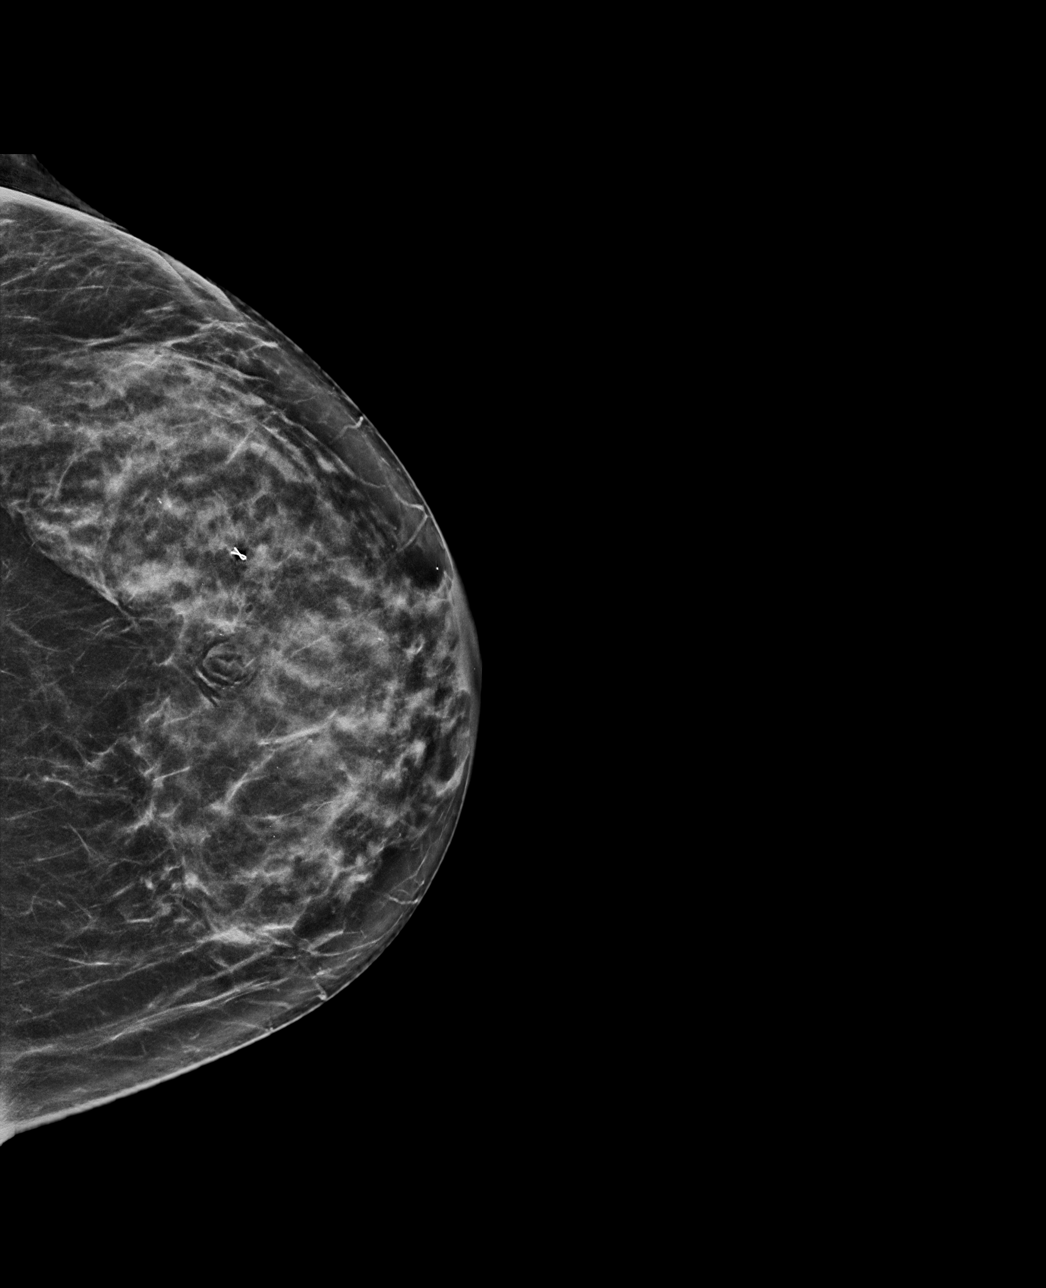

[L ML synth-2D]
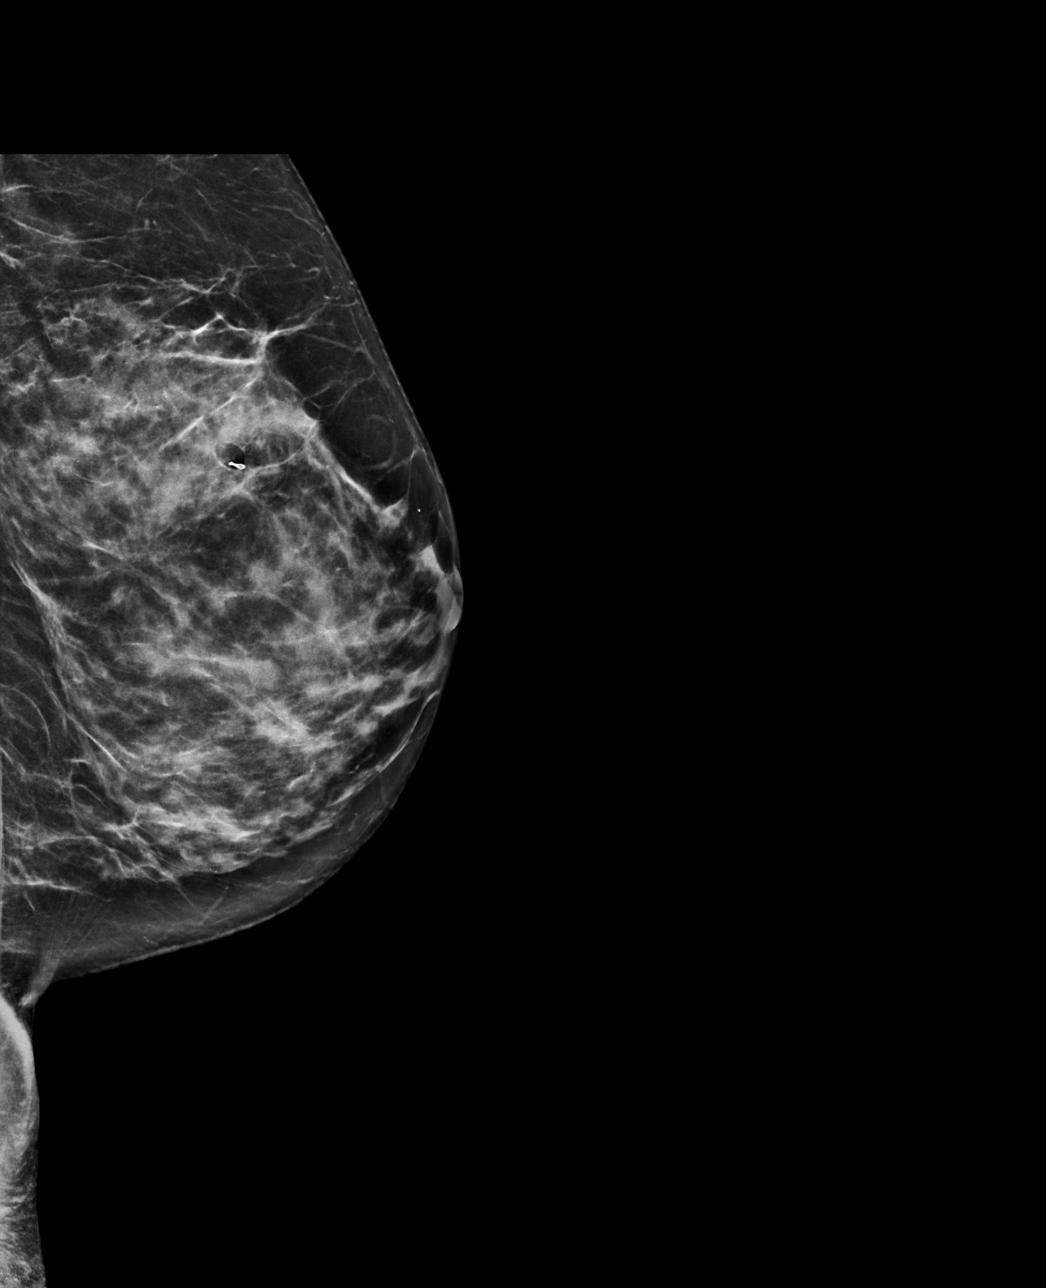

[L CC tomo · tomo slice 37/72.0]
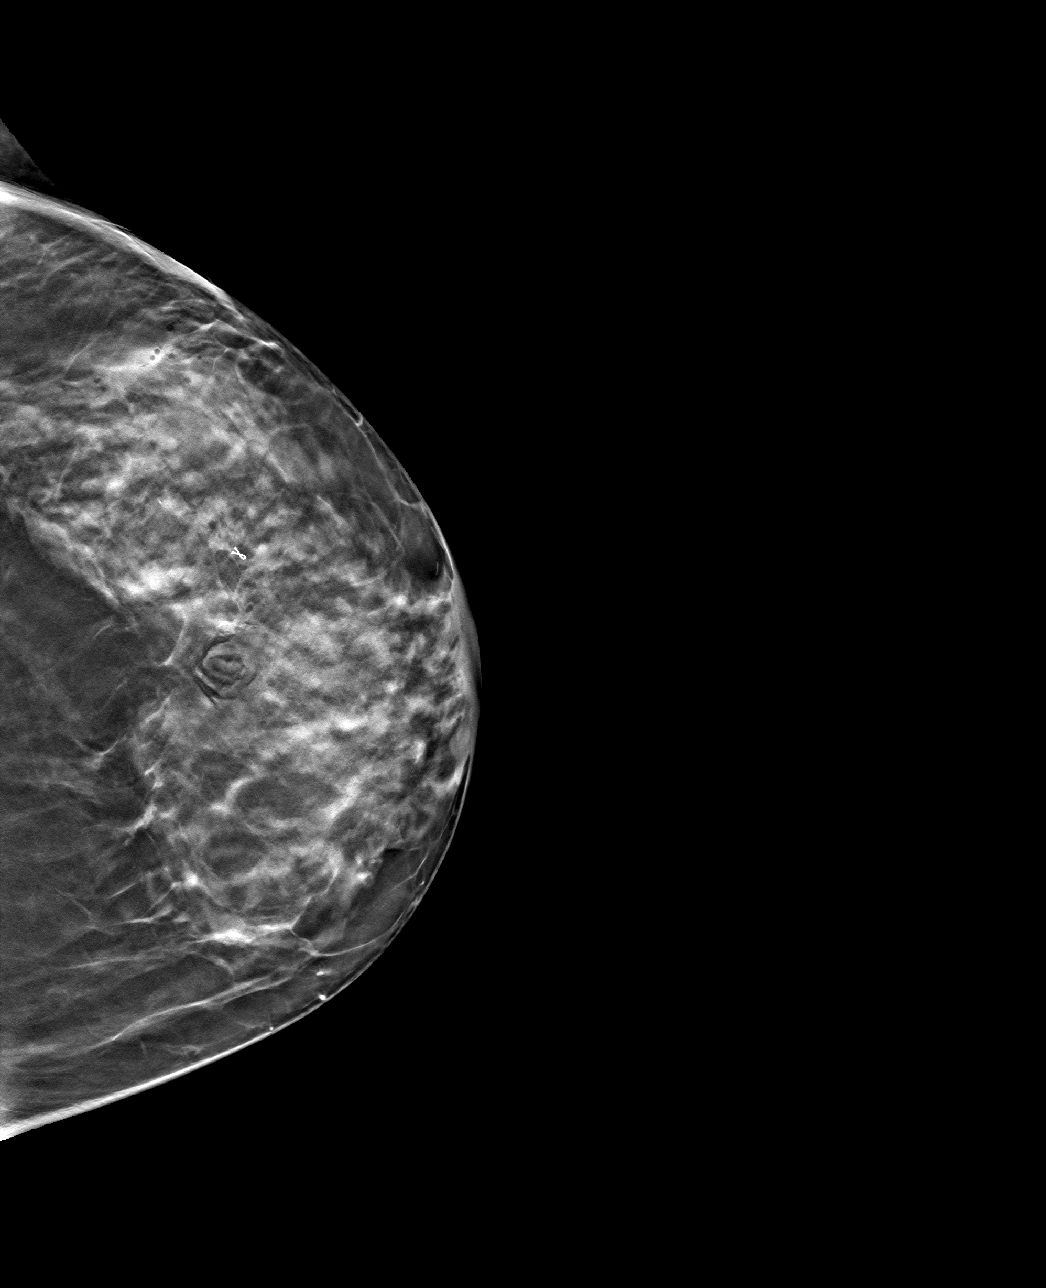

[L ML tomo · tomo slice 37/72.0]
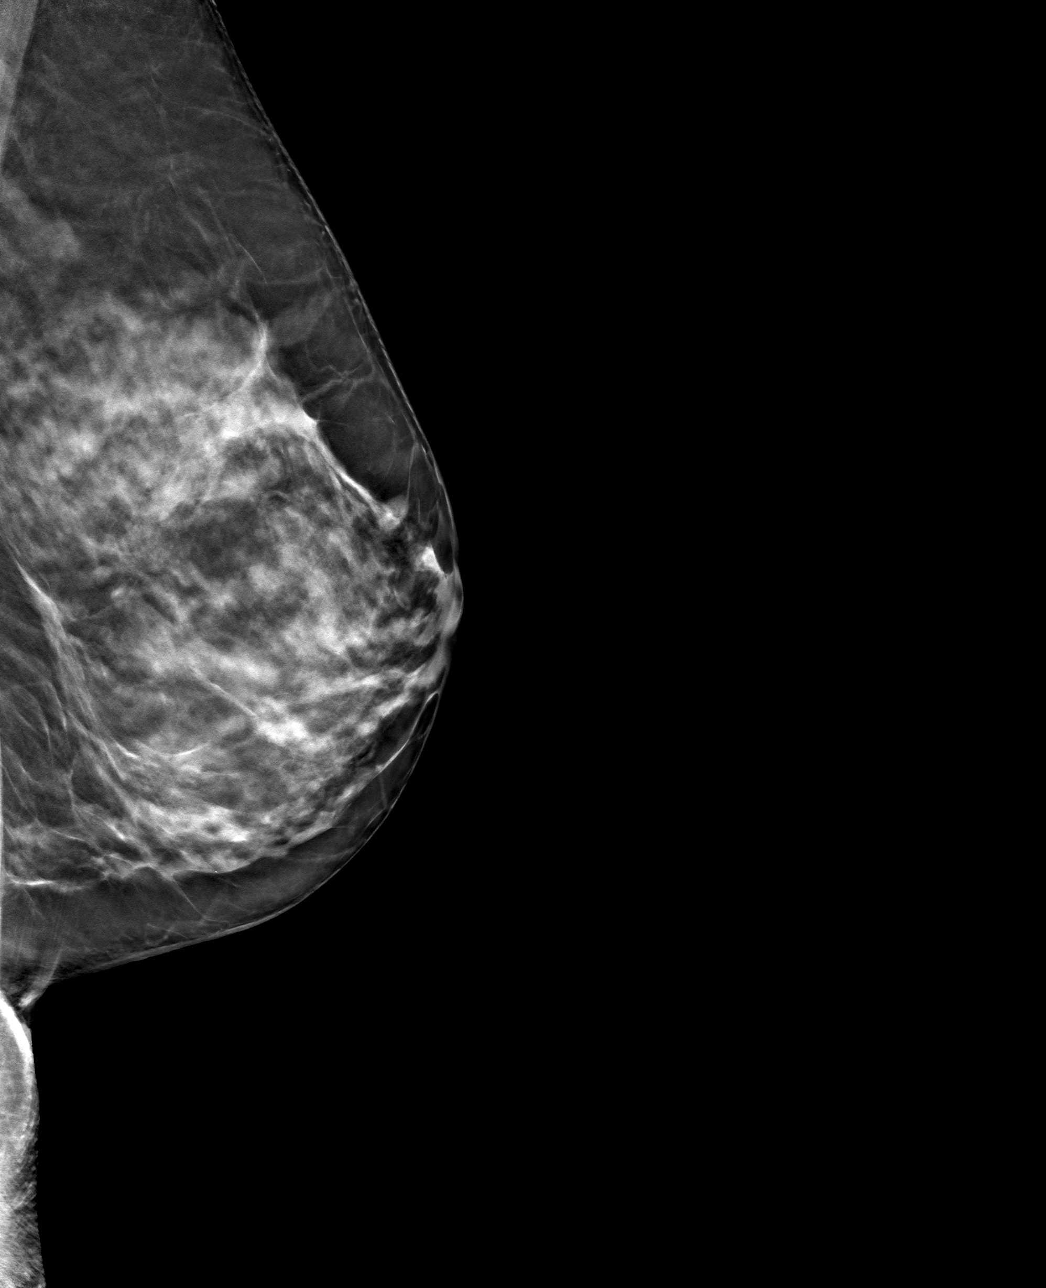

[4 of 12 positions shown; findings below may reference images not displayed]

FINDINGS: Mammographic images were obtained following ultrasound guided biopsy
of palpable mass in the 1 o'clock location of the LEFT breast 4
centimeters from nipple. The biopsy marking clip is in expected
position at the site of biopsy.
IMPRESSION: Appropriate positioning of the ribbon shaped biopsy marking clip at
the site of biopsy in the UPPER-OUTER QUADRANT LEFT breast.

Final Assessment: Post Procedure Mammograms for Marker Placement

## 2020-04-07 ENCOUNTER — Other Ambulatory Visit: Payer: Self-pay | Admitting: Gynecology

## 2020-04-07 DIAGNOSIS — N63 Unspecified lump in unspecified breast: Secondary | ICD-10-CM

## 2020-04-23 DIAGNOSIS — M8580 Other specified disorders of bone density and structure, unspecified site: Secondary | ICD-10-CM | POA: Insufficient documentation

## 2020-05-08 ENCOUNTER — Other Ambulatory Visit: Payer: Self-pay

## 2020-05-08 ENCOUNTER — Ambulatory Visit: Payer: Medicare HMO

## 2020-05-08 ENCOUNTER — Ambulatory Visit
Admission: RE | Admit: 2020-05-08 | Discharge: 2020-05-08 | Disposition: A | Discharge status: Home / Self Care | Payer: Medicare HMO | Source: Ambulatory Visit | Attending: Gynecology | Admitting: Gynecology

## 2020-05-08 DIAGNOSIS — N63 Unspecified lump in unspecified breast: Secondary | ICD-10-CM

## 2020-05-15 DIAGNOSIS — J383 Other diseases of vocal cords: Secondary | ICD-10-CM | POA: Insufficient documentation

## 2020-05-15 DIAGNOSIS — R1313 Dysphagia, pharyngeal phase: Secondary | ICD-10-CM | POA: Insufficient documentation

## 2020-05-22 ENCOUNTER — Encounter: Payer: Self-pay | Admitting: Cardiology

## 2020-05-22 ENCOUNTER — Other Ambulatory Visit: Payer: Self-pay

## 2020-05-22 ENCOUNTER — Other Ambulatory Visit: Payer: Self-pay | Admitting: Cardiology

## 2020-05-22 ENCOUNTER — Ambulatory Visit: Payer: Medicare HMO | Admitting: Cardiology

## 2020-05-22 VITALS — BP 123/76 | HR 90 | Ht 66.0 in | Wt 157.4 lb

## 2020-05-22 DIAGNOSIS — I209 Angina pectoris, unspecified: Secondary | ICD-10-CM

## 2020-05-22 DIAGNOSIS — R0609 Other forms of dyspnea: Secondary | ICD-10-CM

## 2020-05-22 DIAGNOSIS — I10 Essential (primary) hypertension: Secondary | ICD-10-CM

## 2020-05-22 DIAGNOSIS — R0789 Other chest pain: Secondary | ICD-10-CM

## 2020-05-22 DIAGNOSIS — E782 Mixed hyperlipidemia: Secondary | ICD-10-CM

## 2020-05-22 MED ORDER — ASPIRIN EC 81 MG PO TBEC: 81.0000 mg | DELAYED_RELEASE_TABLET | Freq: Every day | ORAL | 0 refills | Status: DC

## 2020-05-22 MED ORDER — NITROGLYCERIN 0.4 MG SL SUBL: 0.4000 mg | SUBLINGUAL_TABLET | SUBLINGUAL | 0 refills | Status: DC | PRN

## 2020-05-22 MED ORDER — METOPROLOL SUCCINATE ER 25 MG PO TB24: 25.0000 mg | ORAL_TABLET | Freq: Every morning | ORAL | 0 refills | Status: DC

## 2020-05-22 NOTE — Progress Notes (Signed)
Date:  05/22/2020   ID:  Jeanne Schwartz, Jeanne Schwartz [birth year], MRN [id number]  PCP:  Heywood Bene, PA-C  Cardiologist:  Rex Kras, DO, [facility] (established care 05/22/2020) Former Cardiology Providers: NA  REASON FOR CONSULT: Chest Pressure and Shortness of breath  REQUESTING PHYSICIAN:  Heywood Bene, PA-C [address] [place],  [place] [address]  Chief Complaint  Patient presents with  . New Patient (Initial Visit)  . Shortness of Breath    Chest pressure    HPI  Jeanne Schwartz is a 74 y.o. female who presents to the office with a chief complaint of "chest pressure and shortness of breath." She is referred to the office at the request of [name], Breejante J, *. Patient's past medical history and cardiovascular risk factors include: Hypertension, hyperlipidemia, postmenopausal female, advanced age  Patient states that she has been having chest discomfort for the last couple months.  It occurs usually twice per week.  Each episode is approximately 30 minutes in duration.  Patient states that the chest pain is over her anterior chest wall but mostly localized to the substernal region, heaviness-like sensation, worse with effort related activities or stressful situations, improves with resting and relaxing.  The symptoms have been nonprogressive.  No significant change in endurance.  She continues to walk around 8000-10,000 steps per day and does aerobic exercise 30 minutes a day 5 days a week.  Over the last 2 weeks she is also started noticing effort related dyspnea which improves with rest.  As result she has been concerned and is here for further evaluation and management.  Currently she is chest pain-free.  Denies prior history of coronary artery disease, myocardial infarction, congestive heart failure, deep venous thrombosis, pulmonary embolism, stroke, transient ischemic attack.  FUNCTIONAL STATUS: 8,000 to 10,000 steps per day and aerobic exercise 66mns 5  days a week.    ALLERGIES: Allergies  Allergen Reactions  . Penicillins Shortness Of Breath  . Prochlorperazine Edisylate Shortness Of Breath  . Sulfonamide Derivatives Nausea Only    MEDICATION LIST PRIOR TO VISIT: Current Meds  Medication Sig  . atorvastatin (LIPITOR) 20 MG tablet Take 20 mg by mouth at bedtime.   . Calcium Carb-Cholecalciferol (CALCIUM 600-D PO) Take 650 mg by mouth daily.  . cholecalciferol (VITAMIN D) 1000 UNITS tablet Take 1,000 Units by mouth daily.    . cycloSPORINE (RESTASIS) 0.05 % ophthalmic emulsion 2 (two) times daily.  .Marland Kitchenlisinopril (ZESTRIL) 10 MG tablet   . Multiple Vitamin (MULTIVITAMIN) capsule Take by mouth.  .Marland Kitchenomeprazole (PRILOSEC) 20 MG capsule Take 20 mg by mouth daily.  .Marland KitchenOVER THE COUNTER MEDICATION Take 1 tablet by mouth daily. Adult Gummy Vitamins   . vitamin C (ASCORBIC ACID) 250 MG tablet Take 250 mg by mouth daily.     PAST MEDICAL HISTORY: Past Medical History:  Diagnosis Date  . GERD (gastroesophageal reflux disease)    TAKES PRILOSEC  . Headache(784.0)    SINCE NECK  THING  . High cholesterol    TAKES LIPITOR  . Hypertension   . PONV (postoperative nausea and vomiting)    NOTHING IN THE LAST COUPLE YRS  . Rotator cuff tear, right    JUST FOUND OUT THIS WEEK    PAST SURGICAL HISTORY: Past Surgical History:  Procedure Laterality Date  . ABDOMINAL HYSTERECTOMY    . ANTERIOR CERVICAL DECOMP/DISCECTOMY FUSION  10/01/2011   Procedure: ANTERIOR CERVICAL DECOMPRESSION/DISCECTOMY FUSION 2 LEVELS;  Surgeon: MMarybelle Killings  Location:  [facility];  Service: Orthopedics;  Laterality: N/A;  C5-6, C6-7 Anterior Cervical Discectomy and Fusion, Allograft and Plate  . BLEPHAROPLASTY     2001  . BREAST BIOPSY Left 11/20/2019  . BREAST EXCISIONAL BIOPSY Right   . BREAST SURGERY     10 YRS  BENIGN  . FRACTURE SURGERY     LEFT  WRIST  . LEFT WRIST     METAL PLATE   . SHOULDER SURGERY Right    arthroscopy and rotator cuff repair  . TUBAL  LIGATION      FAMILY HISTORY: The patient family history includes Breast cancer in her cousin; Diabetes in her maternal grandmother; Heart attack in her mother; Leukemia in her father.  SOCIAL HISTORY:  The patient  reports that she has never smoked. She has never used smokeless tobacco. She reports that she does not drink alcohol and does not use drugs.  REVIEW OF SYSTEMS: Review of Systems  Constitutional: Negative for chills and fever.  HENT: Negative for hoarse voice and nosebleeds.   Eyes: Negative for discharge, double vision and pain.  Cardiovascular: Positive for chest pain and dyspnea on exertion. Negative for claudication, near-syncope, orthopnea, palpitations, paroxysmal nocturnal dyspnea and syncope.  Respiratory: Negative for hemoptysis and shortness of breath.   Musculoskeletal: Negative for muscle cramps and myalgias.  Gastrointestinal: Negative for abdominal pain, constipation, diarrhea, hematemesis, hematochezia, melena, nausea and vomiting.  Neurological: Positive for vertigo (chronic). Negative for dizziness and light-headedness.    PHYSICAL EXAM: Vitals with BMI 05/22/2020 09/18/2019 08/28/2019  Height 5' 6"  5' 6"  5' 6"   Weight 157 lbs 6 oz 148 lbs 148 lbs  BMI 25.42 37.6 28.3  Systolic 151 761 -  Diastolic 76 93 -  Pulse 90 86 -    CONSTITUTIONAL: Well-developed and well-nourished. No acute distress.  SKIN: Skin is warm and dry. No rash noted. No cyanosis. No pallor. No jaundice HEAD: Normocephalic and atraumatic.  EYES: No scleral icterus MOUTH/THROAT: Moist oral membranes.  NECK: No JVD present. No thyromegaly noted. No carotid bruits  LYMPHATIC: No visible cervical adenopathy.  CHEST Normal respiratory effort. No intercostal retractions  LUNGS: Clear to auscultation bilaterally.  No stridor. No wheezes. No rales.  CARDIOVASCULAR: Regular rate and rhythm, positive S1-S2, no murmurs rubs or gallops appreciated ABDOMINAL: Soft, nontender, nondistended,  positive bowel sounds all 4 quadrants.  No apparent ascites.  EXTREMITIES: No peripheral edema  HEMATOLOGIC: No significant bruising NEUROLOGIC: Oriented to person, place, and time. Nonfocal. Normal muscle tone.  PSYCHIATRIC: Normal mood and affect. Normal behavior. Cooperative  CARDIAC DATABASE: EKG: 05/22/2020: Normal sinus rhythm, 94 bpm, normal axis, without underlying ischemia or injury pattern.   Echocardiogram: None  Stress Testing: None  Heart Catheterization: None  LABORATORY DATA: External Labs: Collected: October 10, 2019 Creatinine 1.2 mg/dL. eGFR: 45 mL/min per 1.73 m Lipid profile: Total cholesterol 253, triglycerides 200, HDL 75, LDL 126, non-HDL 178  04/23/2020: BNP 45.1  IMPRESSION:    ICD-10-CM   1. Angina pectoris (HCC)  I20.9 PCV ECHOCARDIOGRAM COMPLETE    PCV MYOCARDIAL PERFUSION WITH LEXISCAN    metoprolol succinate (TOPROL XL) 25 MG 24 hr tablet    aspirin EC 81 MG tablet    nitroGLYCERIN (NITROSTAT) 0.4 MG SL tablet  2. Chest pressure  R07.89 EKG 12-Lead  3. Dyspnea on exertion  R06.00 PCV MYOCARDIAL PERFUSION WITH LEXISCAN  4. Benign hypertension  I10   5. Mixed hyperlipidemia  E78.2      RECOMMENDATIONS: Jeanne Schwartz is a 74  y.o. female whose past medical history and cardiac risk factors include: Hypertension, hyperlipidemia, postmenopausal female, advanced age  Angina pectoris:  EKG today shows normal sinus rhythm without underlying ischemia or injury pattern.  Patient has been having chest discomfort for the last 2 months but no significant change in endurance.  Given her risk factors and symptoms recommend an ischemic evaluation.  Echocardiogram will be ordered to evaluate for structural heart disease and left ventricular systolic function.  Nuclear stress test recommended to evaluate for reversible ischemia.  In the meantime patient is asked to seek medical attention if her intensity of chest discomfort increases, frequency  increases or duration.  Patient verbalizes understanding.  As noted above no active chest pain at the time of evaluation.  For now start aspirin 81 mg p.o. daily.  Start Toprol-XL 25 mg p.o. every morning  We will prescribe sublingual nitroglycerin tablets to use on as needed basis.  Dyspnea on exertion:  Most recent BNP within normal limits.  Plan echocardiogram and nuclear stress test.  Hyperlipidemia: Currently on statin therapy.  Most recent lipid profile per 2020 reviewed as noted above.  Does not endorse myalgias.  Benign essential hypertension: . Office blood pressure is at goal.  . Medication reconciled.  . Currently managed by primary care provider. . If the blood pressure is consistently greater than 156mHg patient is asked to call her primary care provider's office for medication titration sooner than the next office visit.  . Low salt diet recommended. A diet that is rich in fruits, vegetables, legumes, and low-fat dairy products and low in snacks, sweets, and meats (such as the Dietary Approaches to Stop Hypertension [DASH] diet).   I would like to see the patient in close follow-up in the next 2 to 3 weeks.  FINAL MEDICATION LIST END OF ENCOUNTER: Meds ordered this encounter  Medications  . metoprolol succinate (TOPROL XL) 25 MG 24 hr tablet    Sig: Take 1 tablet (25 mg total) by mouth in the morning.    Dispense:  30 tablet    Refill:  0  . aspirin EC 81 MG tablet    Sig: Take 1 tablet (81 mg total) by mouth daily. Swallow whole.    Dispense:  90 tablet    Refill:  0  . nitroGLYCERIN (NITROSTAT) 0.4 MG SL tablet    Sig: Place 1 tablet (0.4 mg total) under the tongue every 5 (five) minutes as needed for chest pain. If you require more than two tablets five minutes apart go to the nearest ER via EMS.    Dispense:  30 tablet    Refill:  0    Medications Discontinued During This Encounter  Medication Reason  . estradiol (VIVELLE-DOT) 03.15MVV/61YWDuplicate  .  glucosamine-chondroitin 500-400 MG tablet Patient Preference  . RaNITidine HCl (RANITIDINE 75 PO) Patient Preference     Current Outpatient Medications:  .  atorvastatin (LIPITOR) 20 MG tablet, Take 20 mg by mouth at bedtime. , Disp: , Rfl:  .  Calcium Carb-Cholecalciferol (CALCIUM 600-D PO), Take 650 mg by mouth daily., Disp: , Rfl:  .  cholecalciferol (VITAMIN D) 1000 UNITS tablet, Take 1,000 Units by mouth daily.  , Disp: , Rfl:  .  cycloSPORINE (RESTASIS) 0.05 % ophthalmic emulsion, 2 (two) times daily., Disp: , Rfl:  .  lisinopril (ZESTRIL) 10 MG tablet, , Disp: , Rfl:  .  Multiple Vitamin (MULTIVITAMIN) capsule, Take by mouth., Disp: , Rfl:  .  omeprazole (PRILOSEC) 20 MG capsule,  Take 20 mg by mouth daily., Disp: , Rfl:  .  OVER THE COUNTER MEDICATION, Take 1 tablet by mouth daily. Adult Gummy Vitamins , Disp: , Rfl:  .  vitamin C (ASCORBIC ACID) 250 MG tablet, Take 250 mg by mouth daily., Disp: , Rfl:  .  aspirin EC 81 MG tablet, Take 1 tablet (81 mg total) by mouth daily. Swallow whole., Disp: 90 tablet, Rfl: 0 .  estradiol (CLIMARA - DOSED IN MG/24 HR) 0.05 mg/24hr patch, , Disp: , Rfl:  .  metoprolol succinate (TOPROL XL) 25 MG 24 hr tablet, Take 1 tablet (25 mg total) by mouth in the morning., Disp: 30 tablet, Rfl: 0 .  nitroGLYCERIN (NITROSTAT) 0.4 MG SL tablet, Place 1 tablet (0.4 mg total) under the tongue every 5 (five) minutes as needed for chest pain. If you require more than two tablets five minutes apart go to the nearest ER via EMS., Disp: 30 tablet, Rfl: 0  Orders Placed This Encounter  Procedures  . PCV MYOCARDIAL PERFUSION WITH LEXISCAN  . EKG 12-Lead  . PCV ECHOCARDIOGRAM COMPLETE    There are no Patient Instructions on file for this visit.   --Continue cardiac medications as reconciled in final medication list. --Return in about 3 weeks (around 06/12/2020) for review test results., re-evaluation of symptoms of chest heaviness. . Or sooner if needed. --Continue  follow-up with your primary care physician regarding the management of your other chronic comorbid conditions.  Patient's questions and concerns were addressed to her satisfaction. She voices understanding of the instructions provided during this encounter.   This note was created using a voice recognition software as a result there may be grammatical errors inadvertently enclosed that do not reflect the nature of this encounter. Every attempt is made to correct such errors.  Rex Kras, [place], [facility]  Pager: [phone] Office: [phone]

## 2020-06-02 ENCOUNTER — Other Ambulatory Visit: Payer: Self-pay

## 2020-06-02 ENCOUNTER — Ambulatory Visit: Payer: Medicare HMO

## 2020-06-02 DIAGNOSIS — R0609 Other forms of dyspnea: Secondary | ICD-10-CM

## 2020-06-02 DIAGNOSIS — I209 Angina pectoris, unspecified: Secondary | ICD-10-CM

## 2020-06-03 ENCOUNTER — Telehealth: Payer: Self-pay

## 2020-06-03 NOTE — Telephone Encounter (Signed)
Pt was called to see if she can come Monday instead.

## 2020-06-03 NOTE — Telephone Encounter (Signed)
-----   Message from [place], [place] sent at 06/02/2020 11:30 PM EDT ----- Intermediate risk study.  The other details of the report will be discussed at the next office visit.   She has an echo scheduled for Thursday, 06/05/2020. Please have her move up her follow-up appointment to Monday, 06/09/2020.If your symptoms have increased in intensity, frequency, duration or new symptoms suggestive of typical chest pain as discussed during last office visit please still seek medical attention at the closest ER via EMS.

## 2020-06-04 NOTE — Telephone Encounter (Signed)
Were you able to get hold off her.

## 2020-06-05 ENCOUNTER — Other Ambulatory Visit: Payer: Self-pay

## 2020-06-05 ENCOUNTER — Ambulatory Visit: Payer: Medicare HMO

## 2020-06-05 DIAGNOSIS — I209 Angina pectoris, unspecified: Secondary | ICD-10-CM

## 2020-06-09 ENCOUNTER — Ambulatory Visit: Payer: Medicare HMO | Admitting: Cardiology

## 2020-06-09 ENCOUNTER — Encounter: Payer: Self-pay | Admitting: Cardiology

## 2020-06-09 ENCOUNTER — Other Ambulatory Visit: Payer: Self-pay

## 2020-06-09 VITALS — BP 148/82 | HR 89 | Resp 16 | Ht 66.0 in | Wt 157.0 lb

## 2020-06-09 DIAGNOSIS — I2089 Other forms of angina pectoris: Secondary | ICD-10-CM

## 2020-06-09 DIAGNOSIS — R0609 Other forms of dyspnea: Secondary | ICD-10-CM

## 2020-06-09 DIAGNOSIS — R9439 Abnormal result of other cardiovascular function study: Secondary | ICD-10-CM

## 2020-06-09 DIAGNOSIS — I208 Other forms of angina pectoris: Secondary | ICD-10-CM

## 2020-06-09 DIAGNOSIS — E782 Mixed hyperlipidemia: Secondary | ICD-10-CM

## 2020-06-09 DIAGNOSIS — I10 Essential (primary) hypertension: Secondary | ICD-10-CM

## 2020-06-09 NOTE — Progress Notes (Signed)
Date:  07/02/2020   ID:  Jeanne Schwartz, Jeanne Schwartz Feb 15, 1946, MRN [id number]  PCP:  Heywood Bene, PA-C  Cardiologist:  Rex Kras, DO, [facility] (established care 05/22/2020) Former Cardiology Providers: NA  Date: 06/09/2020 Last Office Visit: 05/22/2020  Chief Complaint  Patient presents with  . Chest Heaviness  . Follow-up  . Results    HPI  Jeanne Schwartz is a 74 y.o. female who presents to the office with a chief complaint of "chest heaviness, follow-up to review test results." Patient's past medical history and cardiovascular risk factors include: Hypertension, hyperlipidemia, postmenopausal female, advanced age  Patient is accompanied by her husband [name] at today's office visit.  She was initially referred to the office at the request of her primary care provider for evaluation of chest pressure and shortness of breath.  Patient symptoms of chest pain appear to be typical in nature with effort related dyspnea and therefore ischemic evaluation was recommended.  Since last visit patient underwent an echocardiogram and nuclear stress test.  Both of the results were reviewed with her in great detail at today's office visit and noted below for further reference.  At last visit she was started on aspirin and Toprol-XL.  For reasons unknown she has not started Toprol-XL and I have asked her to start it today.  Her stress test was noted to be intermediate risk and symptomatically she continues to have cough related dyspnea and overall decrease in physical endurance.  Patient states that she takes longer for her to walk her daily 8000-10000 steps per day.   Denies prior history of coronary artery disease, myocardial infarction, congestive heart failure, deep venous thrombosis, pulmonary embolism, stroke, transient ischemic attack.  FUNCTIONAL STATUS: 8,000 to 10,000 steps per day and aerobic exercise 30mns 5 days a week.    ALLERGIES: Allergies  Allergen Reactions  . Penicillins  Shortness Of Breath  . Prochlorperazine Edisylate Shortness Of Breath  . Sulfonamide Derivatives Nausea Only    MEDICATION LIST PRIOR TO VISIT: Current Meds  Medication Sig  . aspirin EC 81 MG tablet Take 1 tablet (81 mg total) by mouth daily. Swallow whole.  .Marland Kitchenatorvastatin (LIPITOR) 20 MG tablet Take 20 mg by mouth at bedtime.   . Calcium Carb-Cholecalciferol (CALCIUM 600-D PO) Take 650 mg by mouth daily.  . cholecalciferol (VITAMIN D) 1000 UNITS tablet Take 1,000 Units by mouth daily.    . cycloSPORINE (RESTASIS) 0.05 % ophthalmic emulsion 2 (two) times daily.  .Marland Kitchenestradiol (CLIMARA - DOSED IN MG/24 HR) 0.05 mg/24hr patch   . lisinopril (ZESTRIL) 10 MG tablet   . metoprolol succinate (TOPROL-XL) 25 MG 24 hr tablet TAKE 1 TABLET(25 MG) BY MOUTH IN THE MORNING (Patient taking differently: Patient has not started this medication yet.)  . Multiple Vitamin (MULTIVITAMIN) capsule Take by mouth.  . nitroGLYCERIN (NITROSTAT) 0.4 MG SL tablet Place 1 tablet (0.4 mg total) under the tongue every 5 (five) minutes as needed for chest pain. If you require more than two tablets five minutes apart go to the nearest ER via EMS.  .Marland Kitchenomeprazole (PRILOSEC) 20 MG capsule Take 20 mg by mouth daily.  .Marland KitchenOVER THE COUNTER MEDICATION Take 1 tablet by mouth daily. Adult Gummy Vitamins   . vitamin C (ASCORBIC ACID) 250 MG tablet Take 250 mg by mouth daily.     PAST MEDICAL HISTORY: Past Medical History:  Diagnosis Date  . GERD (gastroesophageal reflux disease)    TAKES PRILOSEC  . Headache(784.0)  SINCE NECK  THING  . High cholesterol    TAKES LIPITOR  . Hypertension   . PONV (postoperative nausea and vomiting)    NOTHING IN THE LAST COUPLE YRS  . Rotator cuff tear, right    JUST FOUND OUT THIS WEEK    PAST SURGICAL HISTORY: Past Surgical History:  Procedure Laterality Date  . ABDOMINAL HYSTERECTOMY    . ANTERIOR CERVICAL DECOMP/DISCECTOMY FUSION  10/01/2011   Procedure: ANTERIOR CERVICAL  DECOMPRESSION/DISCECTOMY FUSION 2 LEVELS;  Surgeon: Marybelle Killings;  Location: [place];  Service: Orthopedics;  Laterality: N/A;  C5-6, C6-7 Anterior Cervical Discectomy and Fusion, Allograft and Plate  . BLEPHAROPLASTY     2001  . BREAST BIOPSY Left 11/20/2019  . BREAST EXCISIONAL BIOPSY Right   . BREAST SURGERY     10 YRS  BENIGN  . FRACTURE SURGERY     LEFT  WRIST  . LEFT WRIST     METAL PLATE   . SHOULDER SURGERY Right    arthroscopy and rotator cuff repair  . TUBAL LIGATION      FAMILY HISTORY: The patient family history includes Breast cancer in her cousin; Diabetes in her maternal grandmother; Heart attack in her mother; Leukemia in her father.  SOCIAL HISTORY:  The patient  reports that she has never smoked. She has never used smokeless tobacco. She reports that she does not drink alcohol and does not use drugs.  REVIEW OF SYSTEMS: Review of Systems  Constitutional: Negative for chills and fever.  HENT: Negative for hoarse voice and nosebleeds.   Eyes: Negative for discharge, double vision and pain.  Cardiovascular: Positive for chest pain (heaviness) and dyspnea on exertion. Negative for claudication, near-syncope, orthopnea, palpitations, paroxysmal nocturnal dyspnea and syncope.  Respiratory: Negative for hemoptysis and shortness of breath.   Musculoskeletal: Negative for muscle cramps and myalgias.  Gastrointestinal: Negative for abdominal pain, constipation, diarrhea, hematemesis, hematochezia, melena, nausea and vomiting.  Neurological: Positive for vertigo (chronic). Negative for dizziness and light-headedness.    PHYSICAL EXAM: Vitals with BMI 06/09/2020 06/09/2020 05/22/2020  Height - 5' 6"  5' 6"   Weight - 157 lbs 157 lbs 6 oz  BMI - 67.61 95.09  Systolic 326 712 458  Diastolic 82 82 76  Pulse 89 108 90    CONSTITUTIONAL: Well-developed and well-nourished. No acute distress.  SKIN: Skin is warm and dry. No rash noted. No cyanosis. No pallor. No jaundice HEAD:  Normocephalic and atraumatic.  EYES: No scleral icterus MOUTH/THROAT: Moist oral membranes.  NECK: No JVD present. No thyromegaly noted. No carotid bruits  LYMPHATIC: No visible cervical adenopathy.  CHEST Normal respiratory effort. No intercostal retractions  LUNGS: Clear to auscultation bilaterally.  No stridor. No wheezes. No rales.  CARDIOVASCULAR: Regular rate and rhythm, positive S1-S2, no murmurs rubs or gallops appreciated ABDOMINAL: Soft, nontender, nondistended, positive bowel sounds all 4 quadrants.  No apparent ascites.  EXTREMITIES: No peripheral edema  HEMATOLOGIC: No significant bruising NEUROLOGIC: Oriented to person, place, and time. Nonfocal. Normal muscle tone.  PSYCHIATRIC: Normal mood and affect. Normal behavior. Cooperative  CARDIAC DATABASE: EKG: 05/22/2020: Normal sinus rhythm, 94 bpm, normal axis, without underlying ischemia or injury pattern.   Echocardiogram: 06/05/2020:  Left ventricle cavity is normal in size. Mild concentric hypertrophy of the left ventricle. Normal global wall motion. Normal LV systolic function  with EF 55%. Normal diastolic filling pattern. Calculated EF 55%.  Mild tricuspid regurgitation. Estimated pulmonary artery systolic pressure 24 mmHg  Stress Testing: Lexiscan (Walking with mod Bruce)Tetrofosmin  Stress Test  06/02/2020: Non-diagnostic ECG stress. There is decreased counts suggestive of reversible moderate defect in the inferior and apical regions, the defect is probably contributed by very small resting and stress  LV volume (47m) than true ischemia.  Overall LV systolic function is normal without regional wall motion abnormalities. Stress LV EF: 72%.  No previous exam available for comparison. Intermediate risk scan. Clinical correlation recommended.   Heart Catheterization: None  LABORATORY DATA: External Labs: Collected: October 10, 2019 Creatinine 1.2 mg/dL. eGFR: 45 mL/min per 1.73 m Lipid profile: Total cholesterol  253, triglycerides 200, HDL 75, LDL 126, non-HDL 178  04/23/2020: BNP 45.1  IMPRESSION:    ICD-10-CM   1. Abnormal stress test  RB70.48Basic metabolic panel    Magnesium    Pro b natriuretic peptide (BNP)    SARS-COV-2 RNA,(COVID-19) QUAL NAAT  2. Anginal equivalent (HCC)  IG89.1Basic metabolic panel    Magnesium    Pro b natriuretic peptide (BNP)    SARS-COV-2 RNA,(COVID-19) QUAL NAAT  3. Dyspnea on exertion  R06.00   4. Benign hypertension  I10   5. Mixed hyperlipidemia  E78.2      RECOMMENDATIONS: SLEZLI DANEKis a 74y.o. female whose past medical history and cardiac risk factors include: Hypertension, hyperlipidemia, postmenopausal female, advanced age  Abnormal stress test:  Given her symptoms of chest heaviness, dyspnea on exertion, and decreased physical endurance she was recommended to undergo an ischemic evaluation.  Echocardiogram notes a preserved left ventricular systolic function without significant valvular heart disease.  Nuclear stress as reported as intermediate study is there is concern for reversible ischemia and what appears to be the RCA distribution.   Given her symptoms recommend an either coronary CTA versus left heart catheterization.  After discussing the risks, benefits, alternatives shared decision was to proceed with left heart catheterization with possible intervention.  The procedure of left heart catheterization with possible intervention was explained to the patient in detail.   The indication, alternatives, risks and benefits were reviewed.   Complications include but not limited to bleeding, infection, vascular injury, stroke, myocardial infection, arrhythmia, kidney injury, radiation-related injury in the case of prolonged fluoroscopy use, emergency cardiac surgery, and death. The patient understands the risks of serious complication is 1-2 in 16945with diagnostic cardiac cath and 1-2% or less with angioplasty/stenting.   I have discussed  in particular detail the possibility of acute renal failure after coronary angiography particularly if PCI is necessary.  I have described that it is possible patient may need short-term dialysis and the less likely may also require long-term dialysis depending on renal recovery.  I have asked for consent to proceed with coronary angiography understanding the risks of potentially needing dialysis, as well as the risks as stated above.    The patient voices understanding and provides verbal feedback and wishes to proceed with coronary angiography with possible PCI.  Continue current medical therapy.  Patient does have sublingual nitroglycerin tablets to use on as needed basis.  She is asked to seek medical attention by going to the closest ER via EMS if her symptoms increase in intensity, frequency, and/or duration.  Dyspnea on exertion:  BMP, BNP  Continue current medical therapy.  Patient is currently euvolemic and not in congestive heart failure.  Hyperlipidemia: Currently on statin therapy.  Most recent lipid profile from 2020 reviewed as noted above.  Does not endorse myalgias.  Benign essential hypertension: . Office blood pressure is at goal.  . Medication  reconciled.  . Currently managed by primary care provider. . If the blood pressure is consistently greater than 117mHg patient is asked to call her primary care provider's office for medication titration sooner than the next office visit.  . Low salt diet recommended. A diet that is rich in fruits, vegetables, legumes, and low-fat dairy products and low in snacks, sweets, and meats (such as the Dietary Approaches to Stop Hypertension [DASH] diet).   FINAL MEDICATION LIST END OF ENCOUNTER: No orders of the defined types were placed in this encounter.   There are no discontinued medications.   Current Outpatient Medications:  .  aspirin EC 81 MG tablet, Take 1 tablet (81 mg total) by mouth daily. Swallow whole., Disp: 90  tablet, Rfl: 0 .  atorvastatin (LIPITOR) 20 MG tablet, Take 20 mg by mouth at bedtime. , Disp: , Rfl:  .  Calcium Carb-Cholecalciferol (CALCIUM 600-D PO), Take 650 mg by mouth daily., Disp: , Rfl:  .  cholecalciferol (VITAMIN D) 1000 UNITS tablet, Take 1,000 Units by mouth daily.  , Disp: , Rfl:  .  cycloSPORINE (RESTASIS) 0.05 % ophthalmic emulsion, 2 (two) times daily., Disp: , Rfl:  .  estradiol (CLIMARA - DOSED IN MG/24 HR) 0.05 mg/24hr patch, , Disp: , Rfl:  .  lisinopril (ZESTRIL) 10 MG tablet, , Disp: , Rfl:  .  metoprolol succinate (TOPROL-XL) 25 MG 24 hr tablet, TAKE 1 TABLET(25 MG) BY MOUTH IN THE MORNING (Patient taking differently: Patient has not started this medication yet.), Disp: 90 tablet, Rfl: 1 .  Multiple Vitamin (MULTIVITAMIN) capsule, Take by mouth., Disp: , Rfl:  .  nitroGLYCERIN (NITROSTAT) 0.4 MG SL tablet, Place 1 tablet (0.4 mg total) under the tongue every 5 (five) minutes as needed for chest pain. If you require more than two tablets five minutes apart go to the nearest ER via EMS., Disp: 30 tablet, Rfl: 0 .  omeprazole (PRILOSEC) 20 MG capsule, Take 20 mg by mouth daily., Disp: , Rfl:  .  OVER THE COUNTER MEDICATION, Take 1 tablet by mouth daily. Adult Gummy Vitamins , Disp: , Rfl:  .  vitamin C (ASCORBIC ACID) 250 MG tablet, Take 250 mg by mouth daily., Disp: , Rfl:   Orders Placed This Encounter  Procedures  . SARS-COV-2 RNA,(COVID-19) QUAL NAAT  . Basic metabolic panel  . Magnesium  . Pro b natriuretic peptide (BNP)    There are no Patient Instructions on file for this visit.   --Continue cardiac medications as reconciled in final medication list. --Return in about 2 weeks (around 06/23/2020) for post heart catheterization  follow up.. Or sooner if needed. --Continue follow-up with your primary care physician regarding the management of your other chronic comorbid conditions.  Patient's questions and concerns were addressed to her satisfaction. She voices  understanding of the instructions provided during this encounter.   This note was created using a voice recognition software as a result there may be grammatical errors inadvertently enclosed that do not reflect the nature of this encounter. Every attempt is made to correct such errors.  SRex Kras DNevada FNaval Hospital [facility] Pager: [phone]: [phone]

## 2020-06-12 ENCOUNTER — Ambulatory Visit: Payer: Medicare HMO | Admitting: Cardiology

## 2020-06-24 LAB — BASIC METABOLIC PANEL
BUN/Creatinine Ratio: 13 (ref 12–28)
BUN: 15 mg/dL (ref 8–27)
CO2: 25 mmol/L (ref 20–29)
Calcium: 9.5 mg/dL (ref 8.7–10.3)
Chloride: 106 mmol/L (ref 96–106)
Creatinine, Ser: 1.17 mg/dL — ABNORMAL HIGH (ref 0.57–1.00)
GFR calc Af Amer: 53 mL/min/{1.73_m2} — ABNORMAL LOW (ref >59)
GFR calc non Af Amer: 46 mL/min/{1.73_m2} — ABNORMAL LOW (ref >59)
Glucose: 96 mg/dL (ref 65–99)
Potassium: 5.1 mmol/L (ref 3.5–5.2)
Sodium: 143 mmol/L (ref 134–144)

## 2020-06-24 LAB — PRO B NATRIURETIC PEPTIDE: NT-Pro BNP: 377 pg/mL — ABNORMAL HIGH (ref 0–301)

## 2020-06-24 LAB — MAGNESIUM: Magnesium: 2 mg/dL (ref 1.6–2.3)

## 2020-07-11 ENCOUNTER — Other Ambulatory Visit (HOSPITAL_COMMUNITY)
Admission: RE | Admit: 2020-07-11 | Discharge: 2020-07-11 | Disposition: A | Discharge status: Home / Self Care | Payer: Medicare HMO | Source: Ambulatory Visit | Attending: Cardiology | Admitting: Cardiology

## 2020-07-11 DIAGNOSIS — Z20822 Contact with and (suspected) exposure to covid-19: Secondary | ICD-10-CM | POA: Insufficient documentation

## 2020-07-11 DIAGNOSIS — Z01812 Encounter for preprocedural laboratory examination: Secondary | ICD-10-CM | POA: Insufficient documentation

## 2020-07-11 LAB — SARS CORONAVIRUS 2 (TAT 6-24 HRS): SARS Coronavirus 2: NEGATIVE

## 2020-07-14 DIAGNOSIS — R079 Chest pain, unspecified: Secondary | ICD-10-CM

## 2020-07-14 DIAGNOSIS — R9439 Abnormal result of other cardiovascular function study: Secondary | ICD-10-CM | POA: Diagnosis present

## 2020-07-15 ENCOUNTER — Ambulatory Visit (HOSPITAL_COMMUNITY)
Admission: RE | Admit: 2020-07-15 | Discharge: 2020-07-15 | Disposition: A | Discharge status: Home / Self Care | Payer: Medicare HMO | Attending: Cardiology | Admitting: Cardiology

## 2020-07-15 ENCOUNTER — Encounter (HOSPITAL_COMMUNITY)
Admission: RE | Disposition: A | Discharge status: Home / Self Care | Payer: Self-pay | Source: Home / Self Care | Attending: Cardiology

## 2020-07-15 DIAGNOSIS — Z880 Allergy status to penicillin: Secondary | ICD-10-CM | POA: Insufficient documentation

## 2020-07-15 DIAGNOSIS — R079 Chest pain, unspecified: Secondary | ICD-10-CM | POA: Diagnosis present

## 2020-07-15 DIAGNOSIS — K219 Gastro-esophageal reflux disease without esophagitis: Secondary | ICD-10-CM | POA: Insufficient documentation

## 2020-07-15 DIAGNOSIS — Z79899 Other long term (current) drug therapy: Secondary | ICD-10-CM | POA: Diagnosis not present

## 2020-07-15 DIAGNOSIS — I10 Essential (primary) hypertension: Secondary | ICD-10-CM | POA: Insufficient documentation

## 2020-07-15 DIAGNOSIS — R0609 Other forms of dyspnea: Secondary | ICD-10-CM | POA: Diagnosis not present

## 2020-07-15 DIAGNOSIS — E7800 Pure hypercholesterolemia, unspecified: Secondary | ICD-10-CM | POA: Insufficient documentation

## 2020-07-15 DIAGNOSIS — E785 Hyperlipidemia, unspecified: Secondary | ICD-10-CM | POA: Insufficient documentation

## 2020-07-15 DIAGNOSIS — Z882 Allergy status to sulfonamides status: Secondary | ICD-10-CM | POA: Insufficient documentation

## 2020-07-15 DIAGNOSIS — Z7982 Long term (current) use of aspirin: Secondary | ICD-10-CM | POA: Insufficient documentation

## 2020-07-15 DIAGNOSIS — R9439 Abnormal result of other cardiovascular function study: Secondary | ICD-10-CM | POA: Diagnosis present

## 2020-07-15 HISTORY — PX: LEFT HEART CATH AND CORONARY ANGIOGRAPHY: CATH118249

## 2020-07-15 LAB — CBC
HCT: 43.5 % (ref 36.0–46.0)
Hemoglobin: 14 g/dL (ref 12.0–15.0)
MCH: 29.7 pg (ref 26.0–34.0)
MCHC: 32.2 g/dL (ref 30.0–36.0)
MCV: 92.2 fL (ref 80.0–100.0)
Platelets: 246 10*3/uL (ref 150–400)
RBC: 4.72 MIL/uL (ref 3.87–5.11)
RDW: 12.5 % (ref 11.5–15.5)
WBC: 5.7 10*3/uL (ref 4.0–10.5)
nRBC: 0 % (ref 0.0–0.2)

## 2020-07-15 SURGERY — LEFT HEART CATH AND CORONARY ANGIOGRAPHY
Anesthesia: LOCAL

## 2020-07-15 MED ORDER — ACETAMINOPHEN 325 MG PO TABS: 650.0000 mg | ORAL_TABLET | ORAL | Status: DC | PRN

## 2020-07-15 MED ORDER — LIDOCAINE HCL (PF) 1 % IJ SOLN
INTRAMUSCULAR | Status: DC | PRN
  Administered 2020-07-15: 2 mL

## 2020-07-15 MED ORDER — SODIUM CHLORIDE 0.9% FLUSH: 3.0000 mL | Freq: Two times a day (BID) | INTRAVENOUS | Status: DC

## 2020-07-15 MED ORDER — HEPARIN SODIUM (PORCINE) 1000 UNIT/ML IJ SOLN
INTRAMUSCULAR | Status: AC
  Filled 2020-07-15: qty 1

## 2020-07-15 MED ORDER — ONDANSETRON HCL 4 MG/2ML IJ SOLN: 4.0000 mg | Freq: Four times a day (QID) | INTRAMUSCULAR | Status: DC | PRN

## 2020-07-15 MED ORDER — HYDRALAZINE HCL 20 MG/ML IJ SOLN: 10.0000 mg | INTRAMUSCULAR | Status: DC | PRN

## 2020-07-15 MED ORDER — SODIUM CHLORIDE 0.9 % IV SOLN: INTRAVENOUS | Status: AC

## 2020-07-15 MED ORDER — FENTANYL CITRATE (PF) 100 MCG/2ML IJ SOLN
INTRAMUSCULAR | Status: DC | PRN
Start: 2020-07-15 — End: 2020-07-15
  Administered 2020-07-15: 50 ug via INTRAVENOUS

## 2020-07-15 MED ORDER — VERAPAMIL HCL 2.5 MG/ML IV SOLN
INTRAVENOUS | Status: DC | PRN
  Administered 2020-07-15: 10 mL via INTRA_ARTERIAL

## 2020-07-15 MED ORDER — SODIUM CHLORIDE 0.9 % IV SOLN: 250.0000 mL | INTRAVENOUS | Status: DC | PRN

## 2020-07-15 MED ORDER — SODIUM CHLORIDE 0.9 % WEIGHT BASED INFUSION
3.0000 mL/kg/h | INTRAVENOUS | Status: AC
  Administered 2020-07-15: 3 mL/kg/h via INTRAVENOUS

## 2020-07-15 MED ORDER — SODIUM CHLORIDE 0.9% FLUSH: 3.0000 mL | INTRAVENOUS | Status: DC | PRN

## 2020-07-15 MED ORDER — SODIUM CHLORIDE 0.9 % WEIGHT BASED INFUSION: 1.0000 mL/kg/h | INTRAVENOUS | Status: DC

## 2020-07-15 MED ORDER — MIDAZOLAM HCL 2 MG/2ML IJ SOLN
INTRAMUSCULAR | Status: DC | PRN
  Administered 2020-07-15: 1 mg via INTRAVENOUS

## 2020-07-15 MED ORDER — HEPARIN (PORCINE) IN NACL 1000-0.9 UT/500ML-% IV SOLN
INTRAVENOUS | Status: DC | PRN
  Administered 2020-07-15: 500 mL

## 2020-07-15 MED ORDER — VERAPAMIL HCL 2.5 MG/ML IV SOLN
INTRAVENOUS | Status: AC
  Filled 2020-07-15: qty 2

## 2020-07-15 MED ORDER — FENTANYL CITRATE (PF) 100 MCG/2ML IJ SOLN
INTRAMUSCULAR | Status: AC
  Filled 2020-07-15: qty 2

## 2020-07-15 MED ORDER — IOHEXOL 350 MG/ML SOLN
INTRAVENOUS | Status: DC | PRN
  Administered 2020-07-15: 40 mL

## 2020-07-15 MED ORDER — ASPIRIN 81 MG PO CHEW
81.0000 mg | CHEWABLE_TABLET | ORAL | Status: AC
  Administered 2020-07-15: 81 mg via ORAL
  Filled 2020-07-15: qty 1

## 2020-07-15 MED ORDER — LABETALOL HCL 5 MG/ML IV SOLN: 10.0000 mg | INTRAVENOUS | Status: DC | PRN

## 2020-07-15 MED ORDER — MIDAZOLAM HCL 2 MG/2ML IJ SOLN
INTRAMUSCULAR | Status: AC
  Filled 2020-07-15: qty 2

## 2020-07-15 SURGICAL SUPPLY — 12 items
CATH 5FR JL3.5 JR4 ANG PIG MP (CATHETERS) ×1 IMPLANT
CATH LAUNCHER 5F JL3 (CATHETERS) IMPLANT
CATHETER LAUNCHER 5F JL3 (CATHETERS) ×2
DEVICE RAD COMP TR BAND LRG (VASCULAR PRODUCTS) ×1 IMPLANT
GLIDESHEATH SLEND A-KIT 6F 22G (SHEATH) ×1 IMPLANT
GUIDEWIRE INQWIRE 1.5J.035X260 (WIRE) IMPLANT
INQWIRE 1.5J .035X260CM (WIRE) ×2
KIT HEART LEFT (KITS) ×2 IMPLANT
PACK CARDIAC CATHETERIZATION (CUSTOM PROCEDURE TRAY) ×2 IMPLANT
SHEATH PROBE COVER 6X72 (BAG) ×1 IMPLANT
TRANSDUCER W/STOPCOCK (MISCELLANEOUS) ×2 IMPLANT
TUBING CIL FLEX 10 FLL-RA (TUBING) ×2 IMPLANT

## 2020-07-15 NOTE — Discharge Instructions (Signed)
DRINK PLENTY OF FLUIDS FOR THE NEXT 2-3 DAYS.  KEEP ARM ELEVATED THE REMAINDER OF THE DAY.  Radial Site Care  This sheet gives you information about how to care for yourself after your procedure. Your health care provider may also give you more specific instructions. If you have problems or questions, contact your health care provider. What can I expect after the procedure? After the procedure, it is common to have:  Bruising and tenderness at the catheter insertion area. Follow these instructions at home: Medicines  Take over-the-counter and prescription medicines only as told by your health care provider. Insertion site care 1. Follow instructions from your health care provider about how to take care of your insertion site. Make sure you: ? Wash your hands with soap and water before you change your bandage (dressing). If soap and water are not available, use hand sanitizer. ? Change your dressing as told by your health care provider. 2. Check your insertion site every day for signs of infection. Check for: ? Redness, swelling, or pain. ? Fluid or blood. ? Pus or a bad smell. ? Warmth. 3. Do not take baths, swim, or use a hot tub for 5 days. 4. You may shower 24-48 hours after the procedure. ? Remove the dressing and gently wash the site with plain soap and water. ? Pat the area dry with a clean towel. ? Do not rub the site. That could cause bleeding. 5. Do not apply powder or lotion to the site. Activity  1. For 24 hours after the procedure, or as directed by your health care provider: ? Do not flex or bend the affected arm. ? Do not push or pull heavy objects with the affected arm. ? Do not drive yourself home from the hospital or clinic. You may drive 24 hours after the procedure. ? Do not operate machinery or power tools. 2. Do not push, pull or lift anything that is heavier than 10 lb for 5 days. 3. Ask your health care provider when it is okay to: ? Return to work or  school. ? Resume usual physical activities or sports. ? Resume sexual activity. General instructions  If the catheter site starts to bleed, raise your arm and put firm pressure on the site. If the bleeding does not stop, get help right away. This is a medical emergency.  If you went home on the same day as your procedure, a responsible adult should be with you for the first 24 hours after you arrive home.  Keep all follow-up visits as told by your health care provider. This is important. Contact a health care provider if:  You have a fever.  You have redness, swelling, or yellow drainage around your insertion site. Get help right away if:  You have unusual pain at the radial site.  The catheter insertion area swells very fast.  The insertion area is bleeding, and the bleeding does not stop when you hold steady pressure on the area.  Your arm or hand becomes pale, cool, tingly, or numb. These symptoms may represent a serious problem that is an emergency. Do not wait to see if the symptoms will go away. Get medical help right away. Call your local emergency services (911 in the [place] Do not drive yourself to the hospital. Summary  After the procedure, it is common to have bruising and tenderness at the site.  Follow instructions from your health care provider about how to take care of your radial site wound. Check   the wound every day for signs of infection.  Do not push, pull or lift anything that is heavier than 10 lb for 5 days.  This information is not intended to replace advice given to you by your health care provider. Make sure you discuss any questions you have with your health care provider. Document Revised: 11/02/2017 Document Reviewed: 11/02/2017 [employer] Patient Education  2020 [employer] 

## 2020-07-15 NOTE — H&P (Signed)
OV 8/30 copied for documentation    Date:  07/15/2020   ID:  Jeanne Schwartz, Jeanne Schwartz [birth year], MRN [id number]  PCP:  Heywood Bene, PA-C  Cardiologist:  Nigel Mormon, DO, [facility] (established care 05/22/2020) Former Cardiology Providers: NA  Date: 06/09/2020 Last Office Visit: 05/22/2020  No chief complaint on file.   HPI  Jeanne Schwartz is a 74 y.o. female who presents to the office with a chief complaint of "chest heaviness, follow-up to review test results." Patient's past medical history and cardiovascular risk factors include: Hypertension, hyperlipidemia, postmenopausal female, advanced age  Patient is accompanied by her husband [name] at today's office visit.  She was initially referred to the office at the request of her primary care provider for evaluation of chest pressure and shortness of breath.  Patient symptoms of chest pain appear to be typical in nature with effort related dyspnea and therefore ischemic evaluation was recommended.  Since last visit patient underwent an echocardiogram and nuclear stress test.  Both of the results were reviewed with her in great detail at today's office visit and noted below for further reference.  At last visit she was started on aspirin and Toprol-XL.  For reasons unknown she has not started Toprol-XL and I have asked her to start it today.  Her stress test was noted to be intermediate risk and symptomatically she continues to have cough related dyspnea and overall decrease in physical endurance.  Patient states that she takes longer for her to walk her daily 8000-10000 steps per day.   Denies prior history of coronary artery disease, myocardial infarction, congestive heart failure, deep venous thrombosis, pulmonary embolism, stroke, transient ischemic attack.  FUNCTIONAL STATUS: 8,000 to 10,000 steps per day and aerobic exercise 58mns 5 days a week.    ALLERGIES: Allergies  Allergen Reactions  . Penicillins Shortness Of  Breath  . Prochlorperazine Edisylate Shortness Of Breath  . Sulfonamide Derivatives Nausea Only    MEDICATION LIST PRIOR TO VISIT: Current Meds  Medication Sig  . acetaminophen (TYLENOL) 500 MG tablet Take 500 mg by mouth every 6 (six) hours as needed for moderate pain or headache.  .Marland Kitchenaspirin EC 81 MG tablet Take 1 tablet (81 mg total) by mouth daily. Swallow whole.  .Marland Kitchenatorvastatin (LIPITOR) 20 MG tablet Take 20 mg by mouth at bedtime.   . Calcium Carb-Cholecalciferol (CALCIUM 600-D PO) Take 650 mg by mouth daily.  . cholecalciferol (VITAMIN D) 1000 UNITS tablet Take 1,000 Units by mouth daily.    . Cyanocobalamin (B-12 PO) Take 1 tablet by mouth daily.  . cycloSPORINE (RESTASIS) 0.05 % ophthalmic emulsion Place 1 drop into both eyes daily as needed (dry eyes).   .Marland Kitchenlisinopril (ZESTRIL) 10 MG tablet Take 10 mg by mouth daily.   . metoprolol succinate (TOPROL-XL) 25 MG 24 hr tablet TAKE 1 TABLET(25 MG) BY MOUTH IN THE MORNING (Patient taking differently: Take 25 mg by mouth daily. )  . Multiple Vitamin (MULTIVITAMIN) capsule Take 1 capsule by mouth daily.   . nitroGLYCERIN (NITROSTAT) 0.4 MG SL tablet Place 1 tablet (0.4 mg total) under the tongue every 5 (five) minutes as needed for chest pain. If you require more than two tablets five minutes apart go to the nearest ER via EMS.  .Marland Kitchenomeprazole (PRILOSEC) 20 MG capsule Take 20 mg by mouth daily.  . vitamin C (ASCORBIC ACID) 250 MG tablet Take 250 mg by mouth daily.     PAST MEDICAL HISTORY: Past Medical History:  Diagnosis Date  . GERD (gastroesophageal reflux disease)    TAKES PRILOSEC  . Headache(784.0)    SINCE NECK  THING  . High cholesterol    TAKES LIPITOR  . Hypertension   . PONV (postoperative nausea and vomiting)    NOTHING IN THE LAST COUPLE YRS  . Rotator cuff tear, right    JUST FOUND OUT THIS WEEK    PAST SURGICAL HISTORY: Past Surgical History:  Procedure Laterality Date  . ABDOMINAL HYSTERECTOMY    . ANTERIOR  CERVICAL DECOMP/DISCECTOMY FUSION  10/01/2011   Procedure: ANTERIOR CERVICAL DECOMPRESSION/DISCECTOMY FUSION 2 LEVELS;  Surgeon: Marybelle Killings;  Location: [place];  Service: Orthopedics;  Laterality: N/A;  C5-6, C6-7 Anterior Cervical Discectomy and Fusion, Allograft and Plate  . BLEPHAROPLASTY     2001  . BREAST BIOPSY Left 11/20/2019  . BREAST EXCISIONAL BIOPSY Right   . BREAST SURGERY     10 YRS  BENIGN  . FRACTURE SURGERY     LEFT  WRIST  . LEFT WRIST     METAL PLATE   . SHOULDER SURGERY Right    arthroscopy and rotator cuff repair  . TUBAL LIGATION      FAMILY HISTORY: The patient family history includes Breast cancer in her cousin; Diabetes in her maternal grandmother; Heart attack in her mother; Leukemia in her father.  SOCIAL HISTORY:  The patient  reports that she has never smoked. She has never used smokeless tobacco. She reports that she does not drink alcohol and does not use drugs.  REVIEW OF SYSTEMS: Review of Systems  Constitutional: Negative for chills and fever.  HENT: Negative for hoarse voice and nosebleeds.   Eyes: Negative for discharge, double vision and pain.  Cardiovascular: Positive for chest pain (heaviness) and dyspnea on exertion. Negative for claudication, near-syncope, orthopnea, palpitations, paroxysmal nocturnal dyspnea and syncope.  Respiratory: Negative for hemoptysis and shortness of breath.   Musculoskeletal: Negative for muscle cramps and myalgias.  Gastrointestinal: Negative for abdominal pain, constipation, diarrhea, hematemesis, hematochezia, melena, nausea and vomiting.  Neurological: Positive for vertigo (chronic). Negative for dizziness and light-headedness.    PHYSICAL EXAM: Vitals with BMI 07/15/2020 06/09/2020 06/09/2020  Height 5' 6"  - 5' 6"   Weight 150 lbs - 157 lbs  BMI 61.44 - 31.54  Systolic 008 676 195  Diastolic 65 82 82  Pulse 72 89 108    CONSTITUTIONAL: Well-developed and well-nourished. No acute distress.  SKIN: Skin is  warm and dry. No rash noted. No cyanosis. No pallor. No jaundice HEAD: Normocephalic and atraumatic.  EYES: No scleral icterus MOUTH/THROAT: Moist oral membranes.  NECK: No JVD present. No thyromegaly noted. No carotid bruits  LYMPHATIC: No visible cervical adenopathy.  CHEST Normal respiratory effort. No intercostal retractions  LUNGS: Clear to auscultation bilaterally.  No stridor. No wheezes. No rales.  CARDIOVASCULAR: Regular rate and rhythm, positive S1-S2, no murmurs rubs or gallops appreciated ABDOMINAL: Soft, nontender, nondistended, positive bowel sounds all 4 quadrants.  No apparent ascites.  EXTREMITIES: No peripheral edema  HEMATOLOGIC: No significant bruising NEUROLOGIC: Oriented to person, place, and time. Nonfocal. Normal muscle tone.  PSYCHIATRIC: Normal mood and affect. Normal behavior. Cooperative  CARDIAC DATABASE: EKG: 05/22/2020: Normal sinus rhythm, 94 bpm, normal axis, without underlying ischemia or injury pattern.   Echocardiogram: 06/05/2020:  Left ventricle cavity is normal in size. Mild concentric hypertrophy of the left ventricle. Normal global wall motion. Normal LV systolic function  with EF 55%. Normal diastolic filling pattern. Calculated EF 55%.  Mild  tricuspid regurgitation. Estimated pulmonary artery systolic pressure 24 mmHg  Stress Testing: Lexiscan (Walking with mod Bruce)Tetrofosmin Stress Test  06/02/2020: Non-diagnostic ECG stress. There is decreased counts suggestive of reversible moderate defect in the inferior and apical regions, the defect is probably contributed by very small resting and stress  LV volume (49m) than true ischemia.  Overall LV systolic function is normal without regional wall motion abnormalities. Stress LV EF: 72%.  No previous exam available for comparison. Intermediate risk scan. Clinical correlation recommended.   Heart Catheterization: None  LABORATORY DATA: External Labs: Collected: October 10, 2019 Creatinine  1.2 mg/dL. eGFR: 45 mL/min per 1.73 m Lipid profile: Total cholesterol 253, triglycerides 200, HDL 75, LDL 126, non-HDL 178  04/23/2020: BNP 45.1  IMPRESSION:  No diagnosis found.   RECOMMENDATIONS: Jeanne DEPOYis a 74y.o. female whose past medical history and cardiac risk factors include: Hypertension, hyperlipidemia, postmenopausal female, advanced age  Abnormal stress test:  Given her symptoms of chest heaviness, dyspnea on exertion, and decreased physical endurance she was recommended to undergo an ischemic evaluation.  Echocardiogram notes a preserved left ventricular systolic function without significant valvular heart disease.  Nuclear stress as reported as intermediate study is there is concern for reversible ischemia and what appears to be the RCA distribution.   Given her symptoms recommend an either coronary CTA versus left heart catheterization.  After discussing the risks, benefits, alternatives shared decision was to proceed with left heart catheterization with possible intervention.  The procedure of left heart catheterization with possible intervention was explained to the patient in detail.   The indication, alternatives, risks and benefits were reviewed.   Complications include but not limited to bleeding, infection, vascular injury, stroke, myocardial infection, arrhythmia, kidney injury, radiation-related injury in the case of prolonged fluoroscopy use, emergency cardiac surgery, and death. The patient understands the risks of serious complication is 1-2 in 11610with diagnostic cardiac cath and 1-2% or less with angioplasty/stenting.   I have discussed in particular detail the possibility of acute renal failure after coronary angiography particularly if PCI is necessary.  I have described that it is possible patient may need short-term dialysis and the less likely may also require long-term dialysis depending on renal recovery.  I have asked for consent to  proceed with coronary angiography understanding the risks of potentially needing dialysis, as well as the risks as stated above.    The patient voices understanding and provides verbal feedback and wishes to proceed with coronary angiography with possible PCI.  Continue current medical therapy.  Patient does have sublingual nitroglycerin tablets to use on as needed basis.  She is asked to seek medical attention by going to the closest ER via EMS if her symptoms increase in intensity, frequency, and/or duration.  Dyspnea on exertion:  BMP, BNP  Continue current medical therapy.  Patient is currently euvolemic and not in congestive heart failure.  Hyperlipidemia: Currently on statin therapy.  Most recent lipid profile from 2020 reviewed as noted above.  Does not endorse myalgias.  Benign essential hypertension: . Office blood pressure is at goal.  . Medication reconciled.  . Currently managed by primary care provider. . If the blood pressure is consistently greater than 1458mg patient is asked to call her primary care provider's office for medication titration sooner than the next office visit.  . Low salt diet recommended. A diet that is rich in fruits, vegetables, legumes, and low-fat dairy products and low in snacks, sweets, and meats (such as  the Dietary Approaches to Stop Hypertension [DASH] diet).   FINAL MEDICATION LIST END OF ENCOUNTER: No orders of the defined types were placed in this encounter.   Medications Discontinued During This Encounter  Medication Reason  . OVER THE COUNTER MEDICATION Change in therapy  . estradiol (CLIMARA - DOSED IN MG/24 HR) 0.05 mg/24hr patch Patient Preference    No current facility-administered medications for this encounter.  Orders Placed This Encounter  Procedures  . EKG 12-Lead    There are no outpatient Patient Instructions on file for this admission.   --Continue cardiac medications as reconciled in final medication list. --No  follow-ups on file. Or sooner if needed. --Continue follow-up with your primary care physician regarding the management of your other chronic comorbid conditions.  Patient's questions and concerns were addressed to her satisfaction. She voices understanding of the instructions provided during this encounter.   This note was created using a voice recognition software as a result there may be grammatical errors inadvertently enclosed that do not reflect the nature of this encounter. Every attempt is made to correct such errors.  Rex Kras, [place], [facility]  Pager: [phone] Office: [phone]

## 2020-07-15 NOTE — Interval H&P Note (Signed)
History and Physical Interval Note:  07/15/2020 11:53 AM  Jeanne Schwartz  has presented today for surgery, with the diagnosis of positive stress test, angina.  The various methods of treatment have been discussed with the patient and family. After consideration of risks, benefits and other options for treatment, the patient has consented to  Procedure(s): LEFT HEART CATH AND CORONARY ANGIOGRAPHY (N/A) as a surgical intervention.  The patient's history has been reviewed, patient examined, no change in status, stable for surgery.  I have reviewed the patient's chart and labs.  Questions were answered to the patient's satisfaction.    2016/2017 Appropriate Use Criteria for Coronary Revascularization Clinical Presentation: Diabetes Mellitus? Symptom Status? S/P CABG? Antianginal Therapy (# of long-acting drugs)? Results of Non-invasive testing? FFR/iFR results in all diseased vessels? Patient undergoing renal transplant? Patient undergoing percutaneous valve procedure (TAVR, MitraClip, Others)? Symptom Status:  Ischemic Symptoms  Non-invasive Testing:  Intermediate Risk  If no or indeterminate stress test, FFR/iFR results in all diseased vessels:  N/A  Diabetes Mellitus:  No  S/P CABG:  No  Antianginal therapy (number of long-acting drugs):  >=2  Patient undergoing renal transplant:  No  Patient undergoing percutaneous valve procedure:  No    newline 1 Vessel Disease PCI CABG  No proximal LAD involvement, No proximal left dominant LCX involvement A (8); Indication 2 M (6); Indication 2   Proximal left dominant LCX involvement A (8); Indication 5 A (8); Indication 5   Proximal LAD involvement A (8); Indication 5 A (8); Indication 5   newline 2 Vessel Disease  No proximal LAD involvement A (8); Indication 8 A (7); Indication 8   Proximal LAD involvement A (8); Indication 11 A (8); Indication 11   newline 3 Vessel Disease  Low disease complexity (e.g., focal stenoses, SYNTAX <=22) A (8);  Indication 17 A (8); Indication 17   Intermediate or high disease complexity (e.g., SYNTAX >=23) M (6); Indication 21 A (9); Indication 21   newline Left Main Disease  Isolated LMCA disease: ostial or midshaft A (7); Indication 24 A (9); Indication 24   Isolated LMCA disease: bifurcation involvement M (6); Indication 25 A (9); Indication 25   LMCA ostial or midshaft, concurrent low disease burden multivessel disease (e.g., 1-2 additional focal stenoses, SYNTAX <=22) A (7); Indication 26 A (9); Indication 26   LMCA ostial or midshaft, concurrent intermediate or high disease burden multivessel disease (e.g., 1-2 additional bifurcation stenoses, long stenoses, SYNTAX >=23) M (4); Indication 27 A (9); Indication 27   LMCA bifurcation involvement, concurrent low disease burden multivessel disease (e.g., 1-2 additional focal stenoses, SYNTAX <=22) M (6); Indication 28 A (9); Indication 28   LMCA bifurcation involvement, concurrent intermediate or high disease burden multivessel disease (e.g., 1-2 additional bifurcation stenoses, long stenoses, SYNTAX >=23) R (3); Indication 29 A (9); Indication 29     Sybel Standish J Khloie Hamada

## 2020-07-16 ENCOUNTER — Encounter (HOSPITAL_COMMUNITY): Payer: Self-pay | Admitting: Cardiology

## 2020-07-16 MED FILL — Heparin Sodium (Porcine) Inj 1000 Unit/ML: INTRAMUSCULAR | Qty: 10 | Status: AC

## 2020-07-22 ENCOUNTER — Ambulatory Visit: Payer: Medicare HMO | Admitting: Cardiology

## 2020-07-22 ENCOUNTER — Other Ambulatory Visit: Payer: Self-pay

## 2020-07-22 ENCOUNTER — Encounter: Payer: Self-pay | Admitting: Cardiology

## 2020-07-22 VITALS — BP 140/68 | HR 69 | Resp 15 | Ht 66.0 in | Wt 156.0 lb

## 2020-07-22 DIAGNOSIS — Z712 Person consulting for explanation of examination or test findings: Secondary | ICD-10-CM

## 2020-07-22 DIAGNOSIS — E782 Mixed hyperlipidemia: Secondary | ICD-10-CM

## 2020-07-22 DIAGNOSIS — R0609 Other forms of dyspnea: Secondary | ICD-10-CM

## 2020-07-22 DIAGNOSIS — I10 Essential (primary) hypertension: Secondary | ICD-10-CM

## 2020-07-22 NOTE — Progress Notes (Signed)
ID:  ANDA SOBOTTA, DOB 03-17-1946, MRN [id number]  PCP:  Heywood Bene, PA-C  Cardiologist:  Rex Kras, DO, [facility] (established care 05/22/2020) Former Cardiology Providers: NA  Date: 07/22/20 Last Office Visit: 06/09/2020  Chief Complaint  Patient presents with  . Follow-up    2 week  . Post Cath    HPI  Jeanne Schwartz is a 74 y.o. female who presents to the office with a chief complaint of "post heart catheterization follow-up." Patient's past medical history and cardiovascular risk factors include: Hypertension, hyperlipidemia, postmenopausal female, advanced age  Patient is accompanied by her husband [name] at today's office visit.  Patient was referred to the office at the request of her primary care provider for evaluation of chest pressure and shortness of breath.  Patient underwent an ischemic evaluation including stress test.  The stress test was noted to be intermediate risk.  Patient was uptitrated on guideline directed medical therapy and continued to have decreasing physical endurance, chest pressure, dyspnea on exertion.  Therefore shared decision was to proceed with left heart catheterization to evaluate for obstructive CAD.  Since last visit patient did undergo left heart catheterization and she was noted to have angiographically normal epicardial coronary arteries and normal LVEDP.  Since left heart catheterization patient states that she is doing well.  She denies dyspnea on exertion.  She is back doing her physical activity on a regular basis.  She continues to have atypical chest pain over the left anterior chest wall but overall improving.  FUNCTIONAL STATUS: 8,000 to 10,000 steps per day and aerobic exercise 52mns 5 days a week.    ALLERGIES: Allergies  Allergen Reactions  . Penicillins Shortness Of Breath  . Prochlorperazine Edisylate Shortness Of Breath  . Sulfonamide Derivatives Nausea Only    MEDICATION LIST PRIOR TO VISIT: Current Meds    Medication Sig  . acetaminophen (TYLENOL) 500 MG tablet Take 500 mg by mouth every 6 (six) hours as needed for moderate pain or headache.  .Marland Kitchenatorvastatin (LIPITOR) 20 MG tablet Take 20 mg by mouth at bedtime.   . Calcium Carb-Cholecalciferol (CALCIUM 600-D PO) Take 650 mg by mouth daily.  . cholecalciferol (VITAMIN D) 1000 UNITS tablet Take 1,000 Units by mouth daily.    . Cyanocobalamin (B-12 PO) Take 1 tablet by mouth daily.  . cycloSPORINE (RESTASIS) 0.05 % ophthalmic emulsion Place 1 drop into both eyes daily as needed (dry eyes).   .Marland Kitchenlisinopril (ZESTRIL) 10 MG tablet Take 10 mg by mouth daily.   . metoprolol succinate (TOPROL-XL) 25 MG 24 hr tablet TAKE 1 TABLET(25 MG) BY MOUTH IN THE MORNING  . Multiple Vitamin (MULTIVITAMIN) capsule Take 1 capsule by mouth daily.   . nitroGLYCERIN (NITROSTAT) 0.4 MG SL tablet Place 1 tablet (0.4 mg total) under the tongue every 5 (five) minutes as needed for chest pain. If you require more than two tablets five minutes apart go to the nearest ER via EMS.  .Marland Kitchenomeprazole (PRILOSEC) 20 MG capsule Take 20 mg by mouth daily.  . vitamin C (ASCORBIC ACID) 250 MG tablet Take 250 mg by mouth daily.  . [DISCONTINUED] aspirin EC 81 MG tablet Take 1 tablet (81 mg total) by mouth daily. Swallow whole.     PAST MEDICAL HISTORY: Past Medical History:  Diagnosis Date  . GERD (gastroesophageal reflux disease)    TAKES PRILOSEC  . Headache(784.0)    SINCE NECK  THING  . High cholesterol    TAKES LIPITOR  .  Hypertension   . PONV (postoperative nausea and vomiting)    NOTHING IN THE LAST COUPLE YRS  . Rotator cuff tear, right    JUST FOUND OUT THIS WEEK    PAST SURGICAL HISTORY: Past Surgical History:  Procedure Laterality Date  . ABDOMINAL HYSTERECTOMY    . ANTERIOR CERVICAL DECOMP/DISCECTOMY FUSION  10/01/2011   Procedure: ANTERIOR CERVICAL DECOMPRESSION/DISCECTOMY FUSION 2 LEVELS;  Surgeon: Marybelle Killings;  Location: [place];  Service: Orthopedics;   Laterality: N/A;  C5-6, C6-7 Anterior Cervical Discectomy and Fusion, Allograft and Plate  . BLEPHAROPLASTY     2001  . BREAST BIOPSY Left 11/20/2019  . BREAST EXCISIONAL BIOPSY Right   . BREAST SURGERY     10 YRS  BENIGN  . FRACTURE SURGERY     LEFT  WRIST  . LEFT HEART CATH AND CORONARY ANGIOGRAPHY N/A 07/15/2020   Procedure: LEFT HEART CATH AND CORONARY ANGIOGRAPHY;  Surgeon: Nigel Mormon, MD;  Location: [place] [facility];  Service: Cardiovascular;  Laterality: N/A;  . LEFT WRIST     METAL PLATE   . SHOULDER SURGERY Right    arthroscopy and rotator cuff repair  . TUBAL LIGATION      FAMILY HISTORY: The patient family history includes Breast cancer in her cousin; Diabetes in her maternal grandmother; Heart attack in her mother; Leukemia in her father.  SOCIAL HISTORY:  The patient  reports that she has never smoked. She has never used smokeless tobacco. She reports that she does not drink alcohol and does not use drugs.  REVIEW OF SYSTEMS: Review of Systems  Constitutional: Negative for chills and fever.  HENT: Negative for hoarse voice and nosebleeds.   Eyes: Negative for discharge, double vision and pain.  Cardiovascular: Negative for chest pain, claudication, dyspnea on exertion, near-syncope, orthopnea, palpitations, paroxysmal nocturnal dyspnea and syncope.  Respiratory: Negative for hemoptysis and shortness of breath.   Musculoskeletal: Negative for muscle cramps and myalgias.  Gastrointestinal: Negative for abdominal pain, constipation, diarrhea, hematemesis, hematochezia, melena, nausea and vomiting.  Neurological: Positive for vertigo (chronic). Negative for dizziness and light-headedness.    PHYSICAL EXAM: Vitals with BMI 07/22/2020 07/15/2020 07/15/2020  Height 5' 6"  - -  Weight 156 lbs - -  BMI 14.97 - -  Systolic 026 378 588  Diastolic 68 55 59  Pulse 69 64 71   CONSTITUTIONAL: Well-developed and well-nourished. No acute distress.  SKIN: Skin is warm  and dry. No rash noted. No cyanosis. No pallor. No jaundice HEAD: Normocephalic and atraumatic.  EYES: No scleral icterus MOUTH/THROAT: Moist oral membranes.  NECK: No JVD present. No thyromegaly noted. No carotid bruits  LYMPHATIC: No visible cervical adenopathy.  CHEST Normal respiratory effort. No intercostal retractions  LUNGS: Clear to auscultation bilaterally.  No stridor. No wheezes. No rales.  CARDIOVASCULAR: Regular rate and rhythm, positive S1-S2, no murmurs rubs or gallops appreciated ABDOMINAL: Soft, nontender, nondistended, positive bowel sounds all 4 quadrants.  No apparent ascites.  EXTREMITIES: No peripheral edema  HEMATOLOGIC: No significant bruising NEUROLOGIC: Oriented to person, place, and time. Nonfocal. Normal muscle tone.  PSYCHIATRIC: Normal mood and affect. Normal behavior. Cooperative  CARDIAC DATABASE: EKG: 05/22/2020: Normal sinus rhythm, 94 bpm, normal axis, without underlying ischemia or injury pattern.   Echocardiogram: 06/05/2020:  Left ventricle cavity is normal in size. Mild concentric hypertrophy of the left ventricle. Normal global wall motion. Normal LV systolic function  with EF 55%. Normal diastolic filling pattern. Calculated EF 55%.  Mild tricuspid regurgitation. Estimated pulmonary  artery systolic pressure 24 mmHg  Stress Testing: Lexiscan (Walking with mod Bruce)Tetrofosmin Stress Test  06/02/2020: Non-diagnostic ECG stress. There is decreased counts suggestive of reversible moderate defect in the inferior and apical regions, the defect is probably contributed by very small resting and stress  LV volume (84m) than true ischemia.  Overall LV systolic function is normal without regional wall motion abnormalities. Stress LV EF: 72%.  No previous exam available for comparison. Intermediate risk scan. Clinical correlation recommended.   Heart Catheterization: 07/15/2020: Angiographically normal epicardial coronary arteries with no coronary artery  disease. Normal LVEDP.  LABORATORY DATA: External Labs: Collected: October 10, 2019 Creatinine 1.2 mg/dL. eGFR: 45 mL/min per 1.73 m Lipid profile: Total cholesterol 253, triglycerides 200, HDL 75, LDL 126, non-HDL 178  04/23/2020: BNP 45.1  IMPRESSION:    ICD-10-CM   1. Dyspnea on exertion  R06.00   2. Benign hypertension  I10   3. Mixed hyperlipidemia  E78.2   4. Encounter to discuss test results  Z71.2      RECOMMENDATIONS: SYARIELIS FUNAROis a 74y.o. female whose past medical history and cardiac risk factors include: Hypertension, hyperlipidemia, postmenopausal female, advanced age  Patient was referred to the office for evaluation of shortness of breath and chest pressure.  She underwent an ischemic evaluation as noted above.  Due to continued symptoms and decreased physical endurance despite up titration of guideline directed medical therapy shared decision during last office visit was to proceed with left heart catheterization.  Invasive angiography noted angiographically normal epicardial coronary arteries with normal left ventricular filling pressures.  Clinically now patient is doing well.  She denies dyspnea on exertion and physical endurance is back to baseline.  She continues to have some atypical left anterior chest pain which I have asked her to discuss further with her PCP for noncardiac causes.  Hyperlipidemia: Currently on statin therapy.  Most recent lipid profile from 2020 reviewed as noted above.  Does not endorse myalgias.  Benign essential hypertension: . Office blood pressure is at goal.  . Medication reconciled.  . Currently managed by primary care provider. . If the blood pressure is consistently greater than 1467mg patient is asked to call her primary care provider's office for medication titration sooner than the next office visit.  . Low salt diet recommended. A diet that is rich in fruits, vegetables, legumes, and low-fat dairy products and low in  snacks, sweets, and meats (such as the Dietary Approaches to Stop Hypertension [DASH] diet).   Informed the patient that I will see her either on a yearly basis to reevaluate her symptoms or as needed basis.  FINAL MEDICATION LIST END OF ENCOUNTER: No orders of the defined types were placed in this encounter.    Current Outpatient Medications:  .  acetaminophen (TYLENOL) 500 MG tablet, Take 500 mg by mouth every 6 (six) hours as needed for moderate pain or headache., Disp: , Rfl:  .  atorvastatin (LIPITOR) 20 MG tablet, Take 20 mg by mouth at bedtime. , Disp: , Rfl:  .  Calcium Carb-Cholecalciferol (CALCIUM 600-D PO), Take 650 mg by mouth daily., Disp: , Rfl:  .  cholecalciferol (VITAMIN D) 1000 UNITS tablet, Take 1,000 Units by mouth daily.  , Disp: , Rfl:  .  Cyanocobalamin (B-12 PO), Take 1 tablet by mouth daily., Disp: , Rfl:  .  cycloSPORINE (RESTASIS) 0.05 % ophthalmic emulsion, Place 1 drop into both eyes daily as needed (dry eyes). , Disp: , Rfl:  .  lisinopril (ZESTRIL) 10 MG tablet, Take 10 mg by mouth daily. , Disp: , Rfl:  .  metoprolol succinate (TOPROL-XL) 25 MG 24 hr tablet, TAKE 1 TABLET(25 MG) BY MOUTH IN THE MORNING, Disp: 90 tablet, Rfl: 1 .  Multiple Vitamin (MULTIVITAMIN) capsule, Take 1 capsule by mouth daily. , Disp: , Rfl:  .  nitroGLYCERIN (NITROSTAT) 0.4 MG SL tablet, Place 1 tablet (0.4 mg total) under the tongue every 5 (five) minutes as needed for chest pain. If you require more than two tablets five minutes apart go to the nearest ER via EMS., Disp: 30 tablet, Rfl: 0 .  omeprazole (PRILOSEC) 20 MG capsule, Take 20 mg by mouth daily., Disp: , Rfl:  .  vitamin C (ASCORBIC ACID) 250 MG tablet, Take 250 mg by mouth daily., Disp: , Rfl:   No orders of the defined types were placed in this encounter.  There are no Patient Instructions on file for this visit.   --Continue cardiac medications as reconciled in final medication list. --Return in about 1 year (around  07/22/2021) for Reevaluation of dyspnea. . Or sooner if needed. --Continue follow-up with your primary care physician regarding the management of your other chronic comorbid conditions.  Patient's questions and concerns were addressed to her satisfaction. She voices understanding of the instructions provided during this encounter.   This note was created using a voice recognition software as a result there may be grammatical errors inadvertently enclosed that do not reflect the nature of this encounter. Every attempt is made to correct such errors.   Total time spent: 20 minutes.  Rex Kras, [place], [facility]  Pager: [phone] Office: [phone]

## 2020-10-24 ENCOUNTER — Other Ambulatory Visit: Payer: Self-pay | Admitting: Gynecology

## 2020-10-24 DIAGNOSIS — Z1231 Encounter for screening mammogram for malignant neoplasm of breast: Secondary | ICD-10-CM

## 2020-11-03 ENCOUNTER — Other Ambulatory Visit: Payer: Self-pay | Admitting: Physician Assistant

## 2020-11-03 DIAGNOSIS — E2839 Other primary ovarian failure: Secondary | ICD-10-CM

## 2020-12-08 ENCOUNTER — Other Ambulatory Visit: Payer: Self-pay

## 2020-12-08 ENCOUNTER — Ambulatory Visit
Admission: RE | Admit: 2020-12-08 | Discharge: 2020-12-08 | Disposition: A | Discharge status: Home / Self Care | Payer: Medicare HMO | Source: Ambulatory Visit | Attending: Gynecology | Admitting: Gynecology

## 2020-12-08 DIAGNOSIS — Z1231 Encounter for screening mammogram for malignant neoplasm of breast: Secondary | ICD-10-CM

## 2020-12-08 IMAGING — MG MM DIGITAL SCREENING BILAT W/ TOMO AND CAD
8 series · 8 of 24 positions shown · non-contrast
Comparison: Previous exam(s).

CLINICAL DATA: Screening.

EXAM:
DIGITAL SCREENING BILATERAL MAMMOGRAM WITH TOMOSYNTHESIS AND CAD
TECHNIQUE: Bilateral screening digital craniocaudal and mediolateral oblique
mammograms were obtained. Bilateral screening digital breast
tomosynthesis was performed. The images were evaluated with
computer-aided detection.

[R MLO synth-2D]
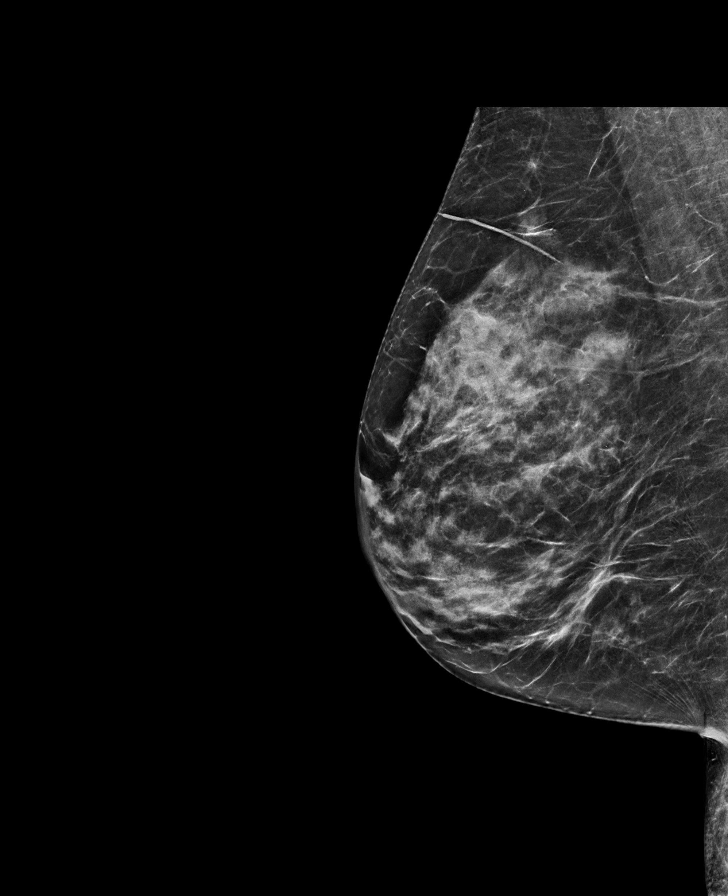

[L CC synth-2D]
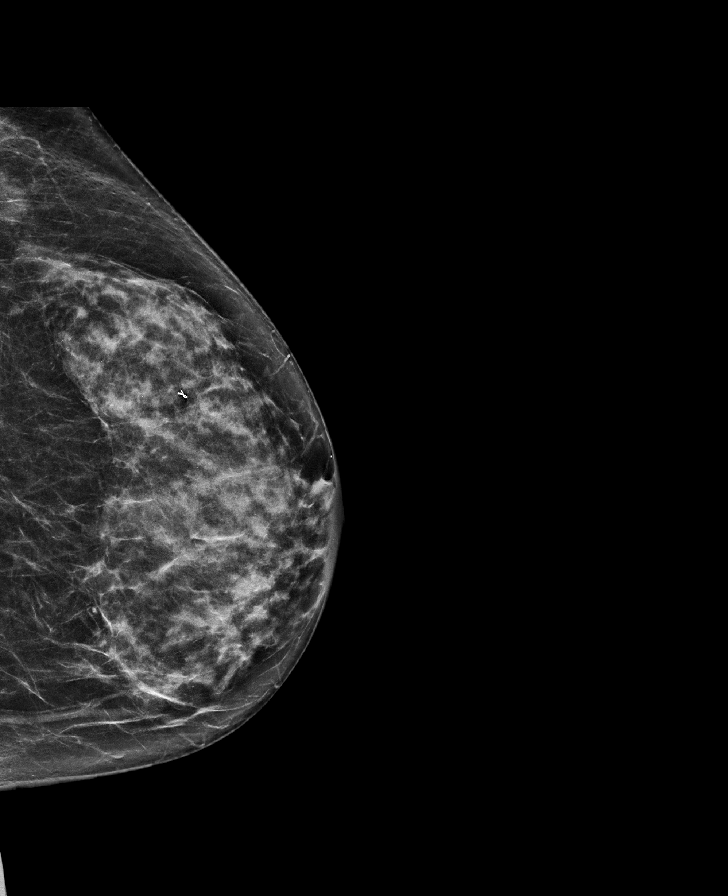

[L MLO synth-2D]
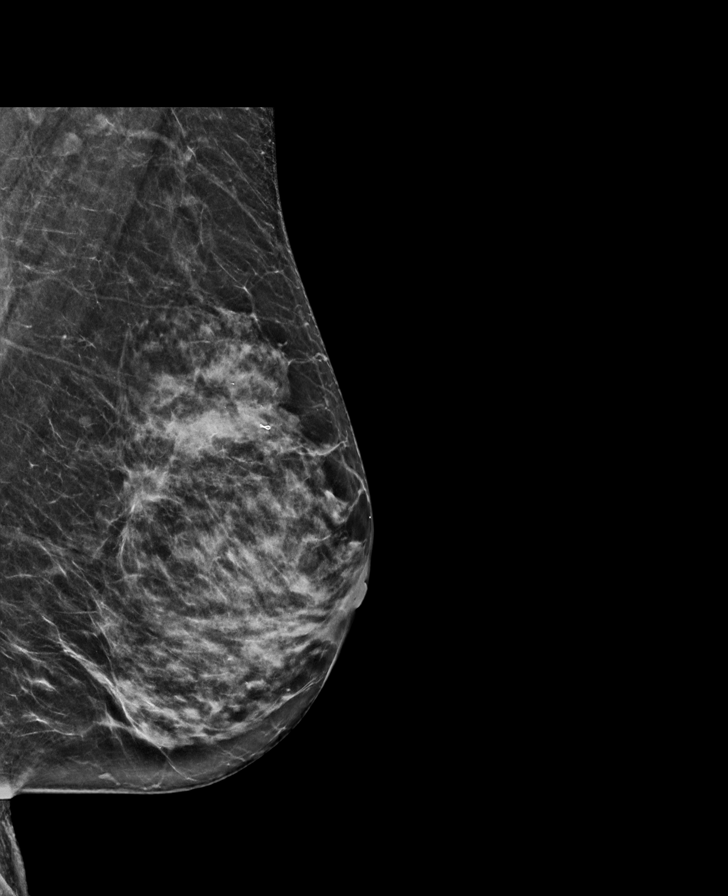

[R CC synth-2D]
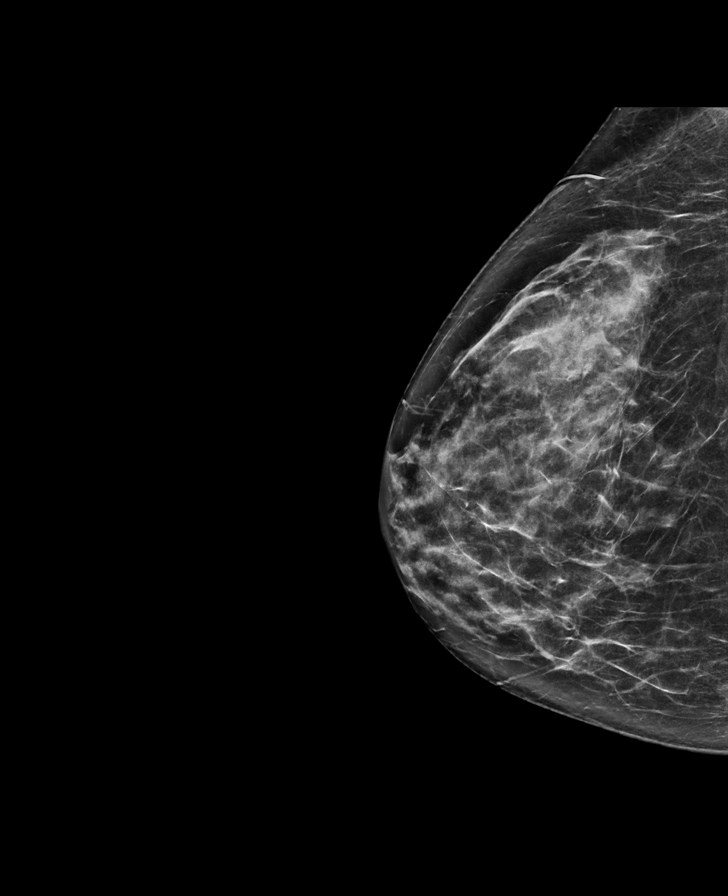

[L CC tomo · tomo slice 36/71.0]
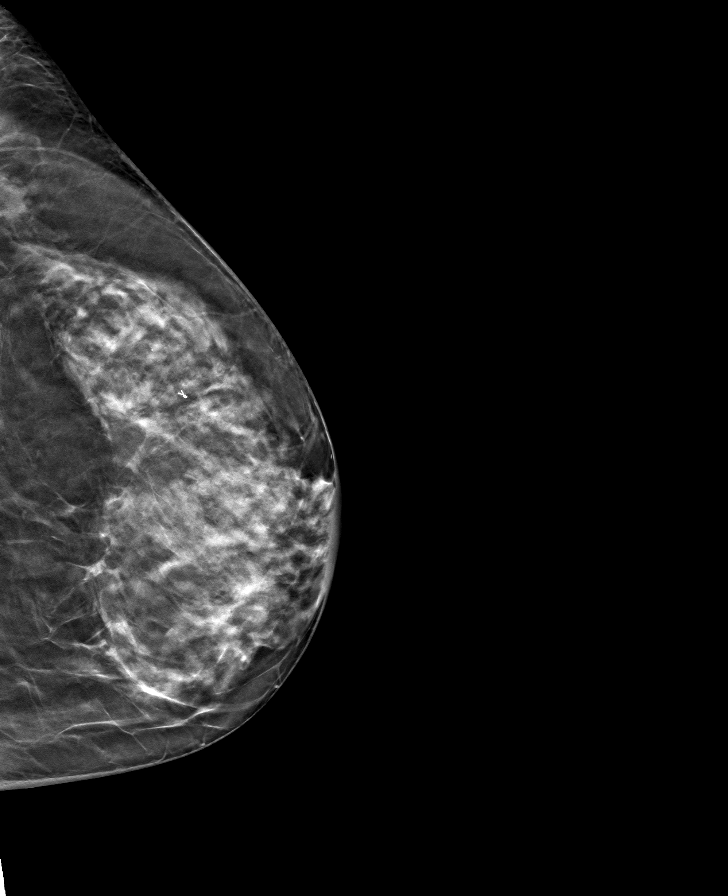

[R MLO tomo · tomo slice 35/69.0]
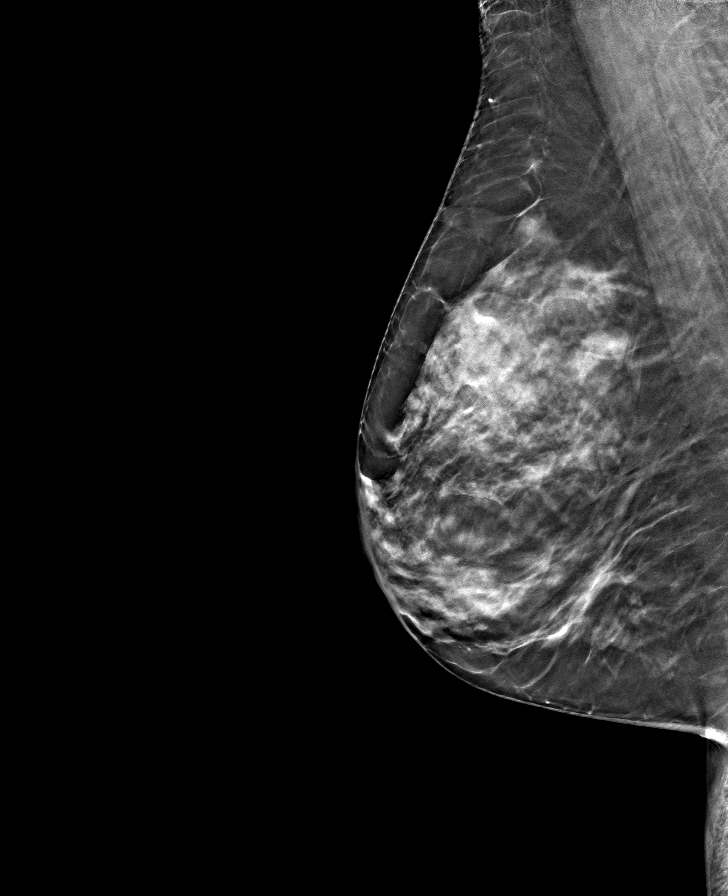

[R CC tomo · tomo slice 37/73.0]
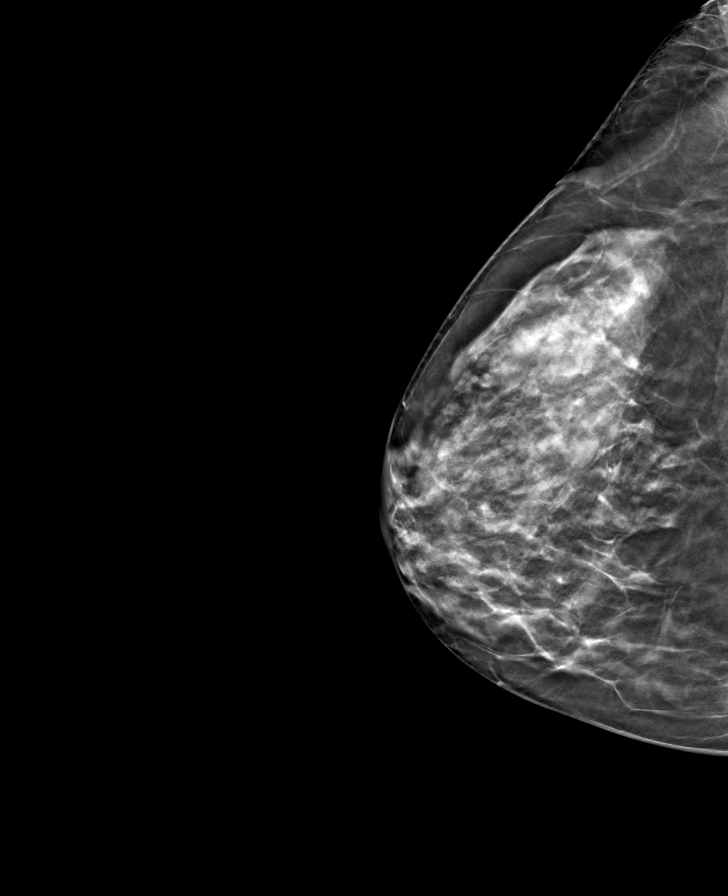

[L MLO tomo · tomo slice 34/67.0]
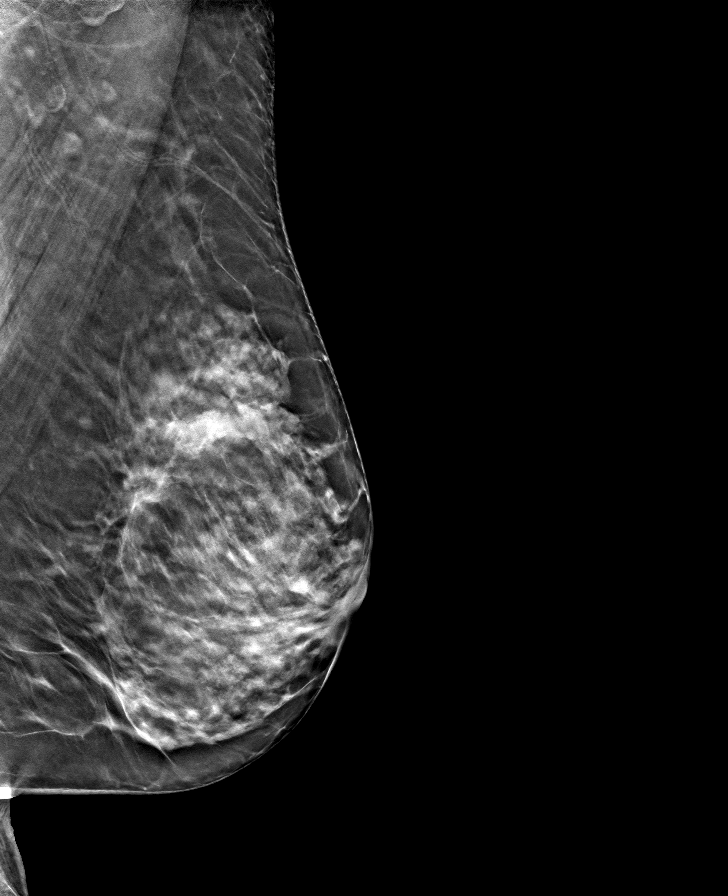

[8 of 24 positions shown; findings below may reference images not displayed]

ACR Breast Density Category c: The breast tissue is heterogeneously
dense, which may obscure small masses.
FINDINGS: There are no findings suspicious for malignancy. The images were
evaluated with computer-aided detection.
IMPRESSION: No mammographic evidence of malignancy. A result letter of this
screening mammogram will be mailed directly to the patient.

RECOMMENDATION:
Screening mammogram in one year. (Code:T4-5-GWO)

BI-RADS CATEGORY  1: Negative.

## 2021-02-10 DIAGNOSIS — R1111 Vomiting without nausea: Secondary | ICD-10-CM | POA: Insufficient documentation

## 2021-02-10 DIAGNOSIS — R1310 Dysphagia, unspecified: Secondary | ICD-10-CM | POA: Insufficient documentation

## 2021-02-10 DIAGNOSIS — K5730 Diverticulosis of large intestine without perforation or abscess without bleeding: Secondary | ICD-10-CM | POA: Insufficient documentation

## 2021-03-05 ENCOUNTER — Other Ambulatory Visit: Payer: Self-pay

## 2021-03-05 ENCOUNTER — Ambulatory Visit
Admission: RE | Admit: 2021-03-05 | Discharge: 2021-03-05 | Disposition: A | Discharge status: Home / Self Care | Payer: Medicare HMO | Source: Ambulatory Visit | Attending: Physician Assistant | Admitting: Physician Assistant

## 2021-03-05 DIAGNOSIS — E2839 Other primary ovarian failure: Secondary | ICD-10-CM

## 2021-03-09 ENCOUNTER — Other Ambulatory Visit: Payer: Self-pay | Admitting: Cardiology

## 2021-03-09 DIAGNOSIS — I209 Angina pectoris, unspecified: Secondary | ICD-10-CM

## 2021-06-02 ENCOUNTER — Other Ambulatory Visit: Payer: Self-pay

## 2021-06-02 DIAGNOSIS — I209 Angina pectoris, unspecified: Secondary | ICD-10-CM

## 2021-06-02 MED ORDER — METOPROLOL SUCCINATE ER 25 MG PO TB24: ORAL_TABLET | ORAL | 1 refills | Status: DC

## 2021-07-22 ENCOUNTER — Ambulatory Visit: Payer: Medicare HMO | Admitting: Cardiology

## 2021-07-22 ENCOUNTER — Other Ambulatory Visit: Payer: Self-pay

## 2021-07-22 ENCOUNTER — Encounter: Payer: Self-pay | Admitting: Cardiology

## 2021-07-22 VITALS — BP 131/71 | HR 68 | Resp 16 | Ht 66.0 in | Wt 159.2 lb

## 2021-07-22 DIAGNOSIS — E782 Mixed hyperlipidemia: Secondary | ICD-10-CM

## 2021-07-22 DIAGNOSIS — I10 Essential (primary) hypertension: Secondary | ICD-10-CM

## 2021-07-22 DIAGNOSIS — R0609 Other forms of dyspnea: Secondary | ICD-10-CM

## 2021-07-22 NOTE — Progress Notes (Signed)
ID:  Jeanne Schwartz, DOB 08-Mar-1946, MRN [id number]  PCP:  Heywood Bene, PA-C  Cardiologist:  Rex Kras, DO, [facility] (established care 05/22/2020) Former Cardiology Providers: NA  Date: 07/22/21 Last Office Visit: 07/22/2020  Chief Complaint  Patient presents with   Dyspnea on exertion   Follow-up    HPI  Jeanne Schwartz is a 75 y.o. female who presents to the office with a chief complaint of " 1 year follow-up visit." Patient's past medical history and cardiovascular risk factors include: Hypertension, hyperlipidemia, postmenopausal female, advanced age  Patient was initially referred to the office for evaluation and management of chest pressure and shortness of breath.  She underwent an ischemic evaluation and given her stress test results and continued symptoms despite up titration of medical therapy she underwent left heart catheterization and was noted to have normal epicardial coronary arteries with normal LVEDP.  Patient now presents for 1 year follow-up.  Over the last 1 year she did not experience any chest pain to suggest angina pectoris.  She denies any shortness of breath at rest or with effort related activities.  No use of sublingual nitroglycerin tablets.  Patient states that her blood pressures at home are very well controlled.  No hospitalizations or urgent care visits for cardiovascular symptoms.  FUNCTIONAL STATUS: 8,000 to 10,000 steps per day and aerobic exercise 32mns 5 days a week.    ALLERGIES: Allergies  Allergen Reactions   Penicillins Shortness Of Breath   Prochlorperazine Edisylate Shortness Of Breath   Sulfonamide Derivatives Nausea Only    MEDICATION LIST PRIOR TO VISIT: Current Meds  Medication Sig   alendronate (FOSAMAX) 70 MG tablet Take by mouth.   atorvastatin (LIPITOR) 20 MG tablet Take 20 mg by mouth at bedtime.    Calcium Carb-Cholecalciferol (CALCIUM 600-D PO) Take 650 mg by mouth daily.   cholecalciferol (VITAMIN D) 1000  UNITS tablet Take 1,000 Units by mouth daily.     CRANBERRY PO Take 1 tablet by mouth daily at 12 noon.   Cyanocobalamin (B-12 PO) Take 1 tablet by mouth daily.   cycloSPORINE (RESTASIS) 0.05 % ophthalmic emulsion Place 1 drop into both eyes daily as needed (dry eyes).    lisinopril (ZESTRIL) 10 MG tablet Take 10 mg by mouth daily.    metoprolol succinate (TOPROL-XL) 25 MG 24 hr tablet TAKE 1 TABLET(25 MG) BY MOUTH IN THE MORNING   Multiple Vitamin (MULTIVITAMIN) capsule Take 1 capsule by mouth daily.    nitroGLYCERIN (NITROSTAT) 0.4 MG SL tablet Place 1 tablet (0.4 mg total) under the tongue every 5 (five) minutes as needed for chest pain. If you require more than two tablets five minutes apart go to the nearest ER via EMS.   omeprazole (PRILOSEC) 20 MG capsule Take 20 mg by mouth daily.   vitamin C (ASCORBIC ACID) 250 MG tablet Take 250 mg by mouth daily.     PAST MEDICAL HISTORY: Past Medical History:  Diagnosis Date   GERD (gastroesophageal reflux disease)    TAKES PRILOSEC   Headache(784.0)    SINCE NECK  THING   High cholesterol    TAKES LIPITOR   Hypertension    PONV (postoperative nausea and vomiting)    NOTHING IN THE LAST COUPLE YRS   Rotator cuff tear, right    JUST FOUND OUT THIS WEEK    PAST SURGICAL HISTORY: Past Surgical History:  Procedure Laterality Date   ABDOMINAL HYSTERECTOMY     ANTERIOR CERVICAL DECOMP/DISCECTOMY FUSION  10/01/2011  Procedure: ANTERIOR CERVICAL DECOMPRESSION/DISCECTOMY FUSION 2 LEVELS;  Surgeon: Marybelle Killings;  Location: [place];  Service: Orthopedics;  Laterality: N/A;  C5-6, C6-7 Anterior Cervical Discectomy and Fusion, Allograft and Plate   BLEPHAROPLASTY     2001   BREAST BIOPSY Left 11/20/2019   BREAST EXCISIONAL BIOPSY Right    BREAST SURGERY     10 YRS  BENIGN   FRACTURE SURGERY     LEFT  WRIST   LEFT HEART CATH AND CORONARY ANGIOGRAPHY N/A 07/15/2020   Procedure: LEFT HEART CATH AND CORONARY ANGIOGRAPHY;  Surgeon: Nigel Mormon, MD;  Location: [place] [facility];  Service: Cardiovascular;  Laterality: N/A;   LEFT WRIST     METAL PLATE    SHOULDER SURGERY Right    arthroscopy and rotator cuff repair   TUBAL LIGATION      FAMILY HISTORY: The patient family history includes Breast cancer in her cousin; Diabetes in her maternal grandmother; Heart attack in her mother; Leukemia in her father.  SOCIAL HISTORY:  The patient  reports that she has never smoked. She has never used smokeless tobacco. She reports that she does not drink alcohol and does not use drugs.  REVIEW OF SYSTEMS: Review of Systems  Constitutional: Negative for chills and fever.  HENT:  Negative for hoarse voice and nosebleeds.   Eyes:  Negative for discharge, double vision and pain.  Cardiovascular:  Negative for chest pain, claudication, dyspnea on exertion, near-syncope, orthopnea, palpitations, paroxysmal nocturnal dyspnea and syncope.  Respiratory:  Negative for hemoptysis and shortness of breath.   Musculoskeletal:  Negative for muscle cramps and myalgias.  Gastrointestinal:  Negative for abdominal pain, constipation, diarrhea, hematemesis, hematochezia, melena, nausea and vomiting.  Neurological:  Positive for vertigo (chronic). Negative for dizziness and light-headedness.   PHYSICAL EXAM: Vitals with BMI 07/22/2021 07/22/2020 07/15/2020  Height _0  _1  -  Weight 159 lbs 3 oz 156 lbs -  BMI 53.61 44.31 -  Systolic 540 086 761  Diastolic 71 68 55  Pulse 68 69 64   CONSTITUTIONAL: Well-developed and well-nourished. No acute distress.  SKIN: Skin is warm and dry. No rash noted. No cyanosis. No pallor. No jaundice HEAD: Normocephalic and atraumatic.  EYES: No scleral icterus MOUTH/THROAT: Moist oral membranes.  NECK: No JVD present. No thyromegaly noted. No carotid bruits  LYMPHATIC: No visible cervical adenopathy.  CHEST Normal respiratory effort. No intercostal retractions  LUNGS: Clear to auscultation bilaterally.  No  stridor. No wheezes. No rales.  CARDIOVASCULAR: Regular rate and rhythm, positive S1-S2, no murmurs rubs or gallops appreciated ABDOMINAL: Soft, nontender, nondistended, positive bowel sounds all 4 quadrants.  No apparent ascites.  EXTREMITIES: No peripheral edema, warm to touch bilaterally, 2+ DP and PT pulses. HEMATOLOGIC: No significant bruising NEUROLOGIC: Oriented to person, place, and time. Nonfocal. Normal muscle tone.  PSYCHIATRIC: Normal mood and affect. Normal behavior. Cooperative  CARDIAC DATABASE: EKG: 07/22/2021: Normal sinus rhythm, 65 bpm, normal axis, without underlying ischemia or injury pattern.  Echocardiogram: 06/05/2020:  Left ventricle cavity is normal in size. Mild concentric hypertrophy of the left ventricle. Normal global wall motion. Normal LV systolic function  with EF 55%. Normal diastolic filling pattern. Calculated EF 55%.  Mild tricuspid regurgitation. Estimated pulmonary artery systolic pressure 24 mmHg  Stress Testing: Lexiscan (Walking with mod Bruce)Tetrofosmin Stress Test  06/02/2020: Non-diagnostic ECG stress. There is decreased counts suggestive of reversible moderate defect in the inferior and apical regions, the defect is probably contributed by very small resting  and stress  LV volume (1m) than true ischemia.  Overall LV systolic function is normal without regional wall motion abnormalities. Stress LV EF: 72%.  No previous exam available for comparison. Intermediate risk scan. Clinical correlation recommended.   Heart Catheterization: 07/15/2020: Angiographically normal epicardial coronary arteries with no coronary artery disease. Normal LVEDP.  LABORATORY DATA: External Labs: Collected: October 10, 2019 Creatinine 1.2 mg/dL. eGFR: 45 mL/min per 1.73 m Lipid profile: Total cholesterol 253, triglycerides 200, HDL 75, LDL 126, non-HDL 178  04/23/2020: BNP 45.1  IMPRESSION:    ICD-10-CM   1. Dyspnea on exertion  R06.09 EKG 12-Lead     2. Benign hypertension  I10     3. Mixed hyperlipidemia  E78.2        RECOMMENDATIONS: SSHIRLEEN MCFAULis a 75y.o. female whose past medical history and cardiac risk factors include: Hypertension, hyperlipidemia, postmenopausal female, advanced age  Dyspnea on exertion Relatively stable over the last 1 year. Denies angina pectoris. No use of sublingual nitroglycerin tablets. Reviewed ischemic work-up performed during prior office visit. EKG nonischemic. Would continue current medical therapy.  Benign hypertension Office blood pressures are very well controlled. Medications reconciled. Currently managed by primary care provider.  Mixed hyperlipidemia Currently on atorvastatin.   She denies myalgia or other side effects. Currently managed by primary care provider.  FINAL MEDICATION LIST END OF ENCOUNTER: No orders of the defined types were placed in this encounter.    Current Outpatient Medications:    alendronate (FOSAMAX) 70 MG tablet, Take by mouth., Disp: , Rfl:    atorvastatin (LIPITOR) 20 MG tablet, Take 20 mg by mouth at bedtime. , Disp: , Rfl:    Calcium Carb-Cholecalciferol (CALCIUM 600-D PO), Take 650 mg by mouth daily., Disp: , Rfl:    cholecalciferol (VITAMIN D) 1000 UNITS tablet, Take 1,000 Units by mouth daily.  , Disp: , Rfl:    CRANBERRY PO, Take 1 tablet by mouth daily at 12 noon., Disp: , Rfl:    Cyanocobalamin (B-12 PO), Take 1 tablet by mouth daily., Disp: , Rfl:    cycloSPORINE (RESTASIS) 0.05 % ophthalmic emulsion, Place 1 drop into both eyes daily as needed (dry eyes). , Disp: , Rfl:    lisinopril (ZESTRIL) 10 MG tablet, Take 10 mg by mouth daily. , Disp: , Rfl:    metoprolol succinate (TOPROL-XL) 25 MG 24 hr tablet, TAKE 1 TABLET(25 MG) BY MOUTH IN THE MORNING, Disp: 90 tablet, Rfl: 1   Multiple Vitamin (MULTIVITAMIN) capsule, Take 1 capsule by mouth daily. , Disp: , Rfl:    nitroGLYCERIN (NITROSTAT) 0.4 MG SL tablet, Place 1 tablet (0.4 mg total)  under the tongue every 5 (five) minutes as needed for chest pain. If you require more than two tablets five minutes apart go to the nearest ER via EMS., Disp: 30 tablet, Rfl: 0   omeprazole (PRILOSEC) 20 MG capsule, Take 20 mg by mouth daily., Disp: , Rfl:    vitamin C (ASCORBIC ACID) 250 MG tablet, Take 250 mg by mouth daily., Disp: , Rfl:   Orders Placed This Encounter  Procedures   EKG 12-Lead    There are no Patient Instructions on file for this visit.   --Continue cardiac medications as reconciled in final medication list. --Return in about 1 year (around 07/22/2022) for Follow up. Or sooner if needed. --Continue follow-up with your primary care physician regarding the management of your other chronic comorbid conditions.  Patient's questions and concerns were addressed to her satisfaction. She  voices understanding of the instructions provided during this encounter.   This note was created using a voice recognition software as a result there may be grammatical errors inadvertently enclosed that do not reflect the nature of this encounter. Every attempt is made to correct such errors.   Total time spent: 20 minutes.  Rex Kras, [place], [facility]  Pager: [phone] Office: [phone]

## 2021-09-23 ENCOUNTER — Telehealth: Payer: Self-pay

## 2021-09-23 NOTE — Telephone Encounter (Signed)
Spoke to patient.   Substernal and right to the sternum, three weeks duration, progressive, better w/ sitting, worse w/ lying back or laying on right side, non-exertional, not relieved w/ resting. Constant, ache like, 2/10 now, at worse 7/10, has not been sick, daughter and son-in-law has had COVID but minimal contact.Suspect Pericarditis. Requested she go ED for evaluation / labs / ekg and rule out acute process.   Please have her come in the next week to be seen. Arrange it w/ front desk.

## 2021-09-28 NOTE — Telephone Encounter (Signed)
I have called patient multiple times last week left two vm, just forgot to document patient has not answered

## 2021-10-20 ENCOUNTER — Ambulatory Visit: Payer: Medicare HMO | Admitting: Cardiology

## 2021-10-28 ENCOUNTER — Other Ambulatory Visit: Payer: Self-pay | Admitting: Cardiology

## 2021-10-28 DIAGNOSIS — I209 Angina pectoris, unspecified: Secondary | ICD-10-CM

## 2021-10-29 ENCOUNTER — Other Ambulatory Visit: Payer: Self-pay | Admitting: Gynecology

## 2021-10-29 DIAGNOSIS — Z1231 Encounter for screening mammogram for malignant neoplasm of breast: Secondary | ICD-10-CM

## 2021-12-09 ENCOUNTER — Other Ambulatory Visit: Payer: Self-pay

## 2021-12-09 ENCOUNTER — Ambulatory Visit
Admission: RE | Admit: 2021-12-09 | Discharge: 2021-12-09 | Disposition: A | Discharge status: Home / Self Care | Payer: Medicare HMO | Source: Ambulatory Visit | Attending: Gynecology | Admitting: Gynecology

## 2021-12-09 DIAGNOSIS — Z1231 Encounter for screening mammogram for malignant neoplasm of breast: Secondary | ICD-10-CM

## 2021-12-09 IMAGING — MG MM DIGITAL SCREENING BILAT W/ TOMO AND CAD
6 of 10 series · 6 of 30 positions shown · non-contrast
Comparison: Previous exam(s).

CLINICAL DATA: Screening.

EXAM:
DIGITAL SCREENING BILATERAL MAMMOGRAM WITH TOMOSYNTHESIS AND CAD
TECHNIQUE: Bilateral screening digital craniocaudal and mediolateral oblique
mammograms were obtained. Bilateral screening digital breast
tomosynthesis was performed. The images were evaluated with
computer-aided detection.

[L XCCL synth-2D]
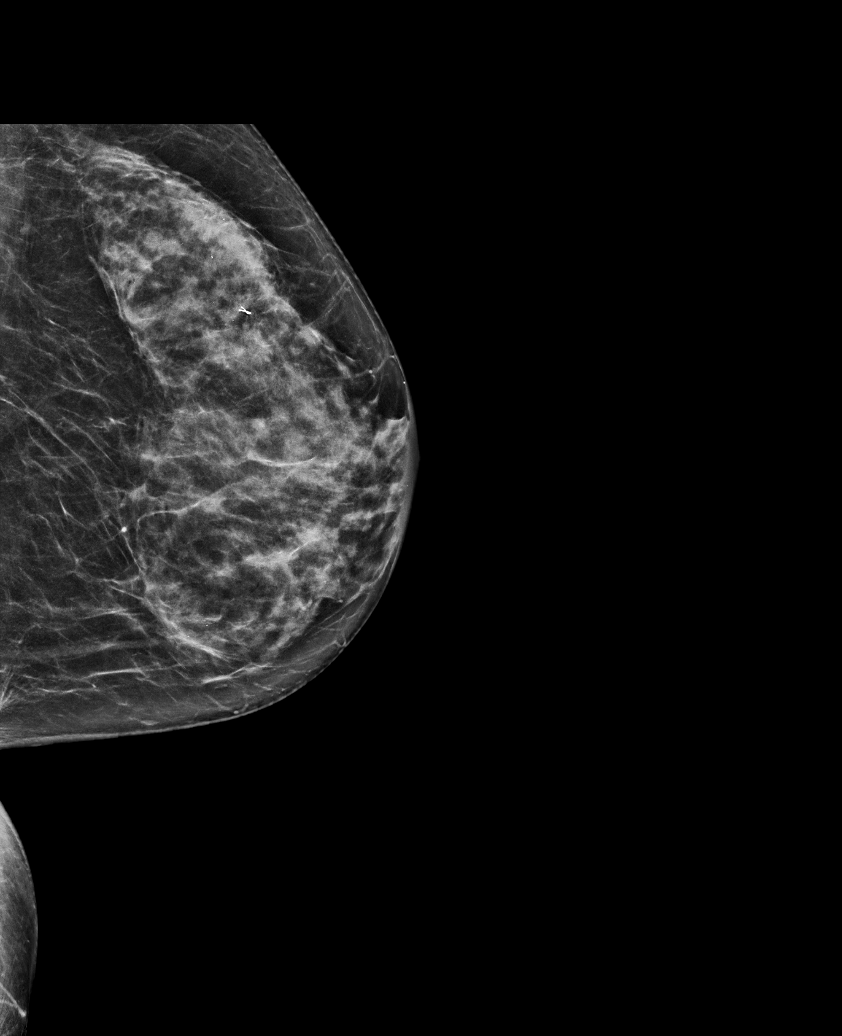

[R MLO synth-2D]
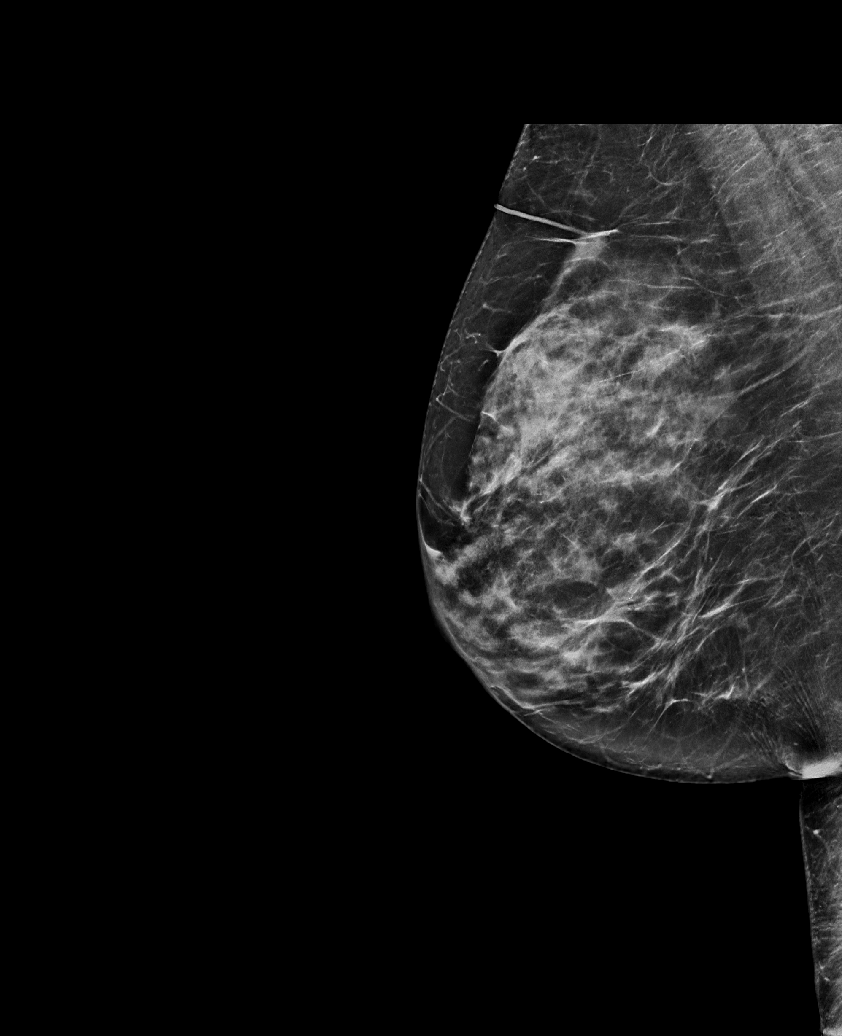

[R CC synth-2D]
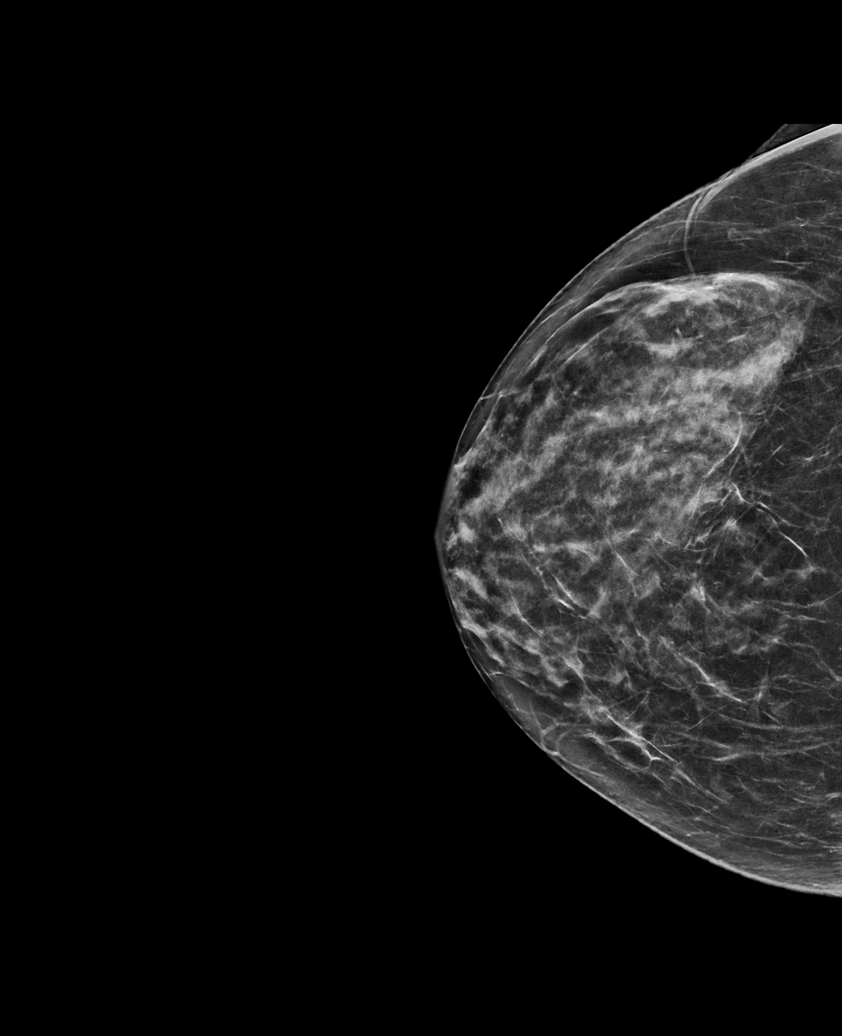

[L MLO synth-2D]
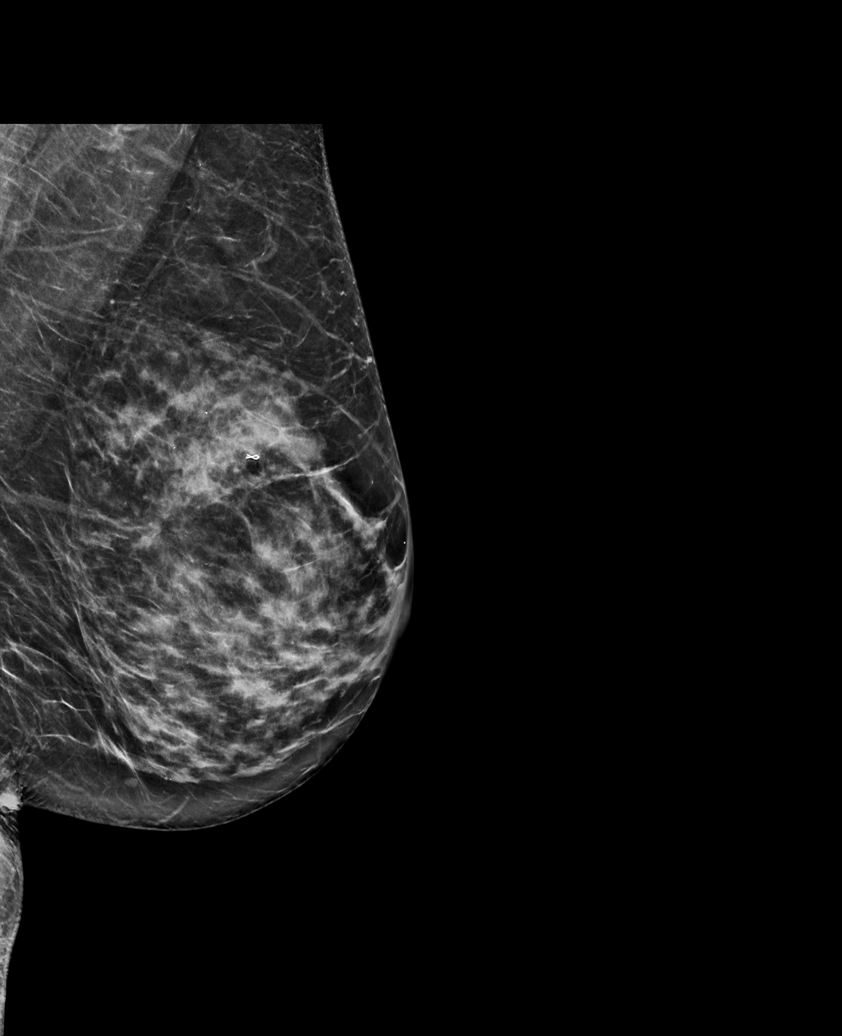

[L CC synth-2D]
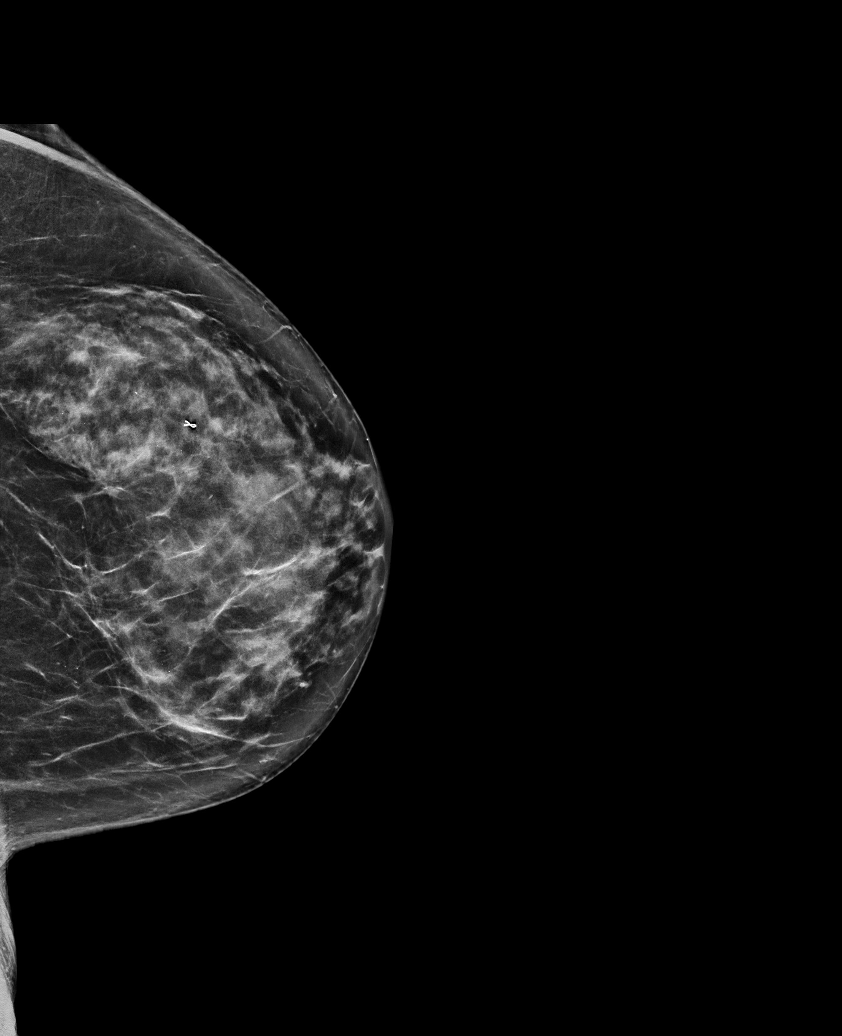

[L XCCL tomo · tomo slice 36/71.0]
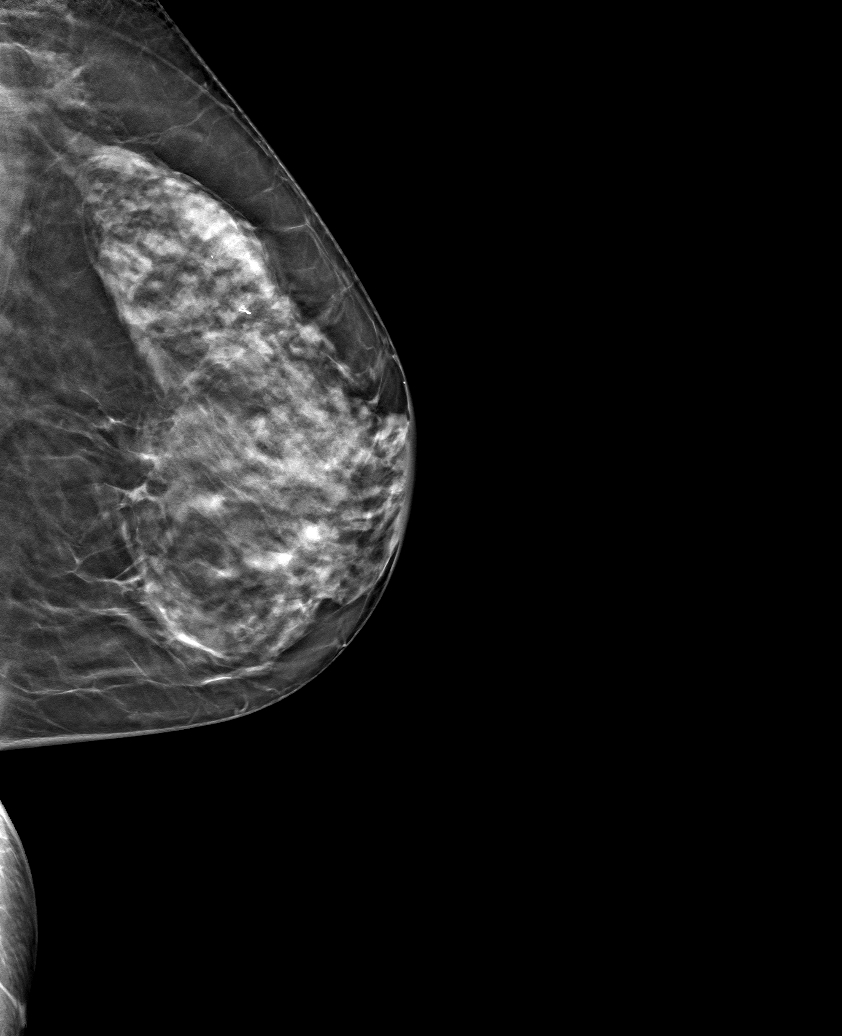

[6 of 30 positions shown; findings below may reference images not displayed]

ACR Breast Density Category c: The breast tissue is heterogeneously
dense, which may obscure small masses.
FINDINGS: There are no findings suspicious for malignancy.
IMPRESSION: No mammographic evidence of malignancy. A result letter of this
screening mammogram will be mailed directly to the patient.

RECOMMENDATION:
Screening mammogram in one year. (Code:[id number])

BI-RADS CATEGORY  1: Negative.

## 2022-04-21 ENCOUNTER — Other Ambulatory Visit: Payer: Self-pay | Admitting: Cardiology

## 2022-04-21 DIAGNOSIS — I209 Angina pectoris, unspecified: Secondary | ICD-10-CM

## 2022-07-08 DIAGNOSIS — R070 Pain in throat: Secondary | ICD-10-CM | POA: Insufficient documentation

## 2022-07-22 ENCOUNTER — Ambulatory Visit: Payer: Medicare HMO | Admitting: Cardiology

## 2022-08-17 ENCOUNTER — Encounter: Payer: Self-pay | Admitting: Cardiology

## 2022-08-17 ENCOUNTER — Ambulatory Visit: Payer: Medicare HMO | Admitting: Cardiology

## 2022-08-17 VITALS — BP 135/76 | HR 86 | Ht 66.0 in | Wt 161.2 lb

## 2022-08-17 DIAGNOSIS — R0609 Other forms of dyspnea: Secondary | ICD-10-CM

## 2022-08-17 DIAGNOSIS — E782 Mixed hyperlipidemia: Secondary | ICD-10-CM

## 2022-08-17 DIAGNOSIS — I10 Essential (primary) hypertension: Secondary | ICD-10-CM

## 2022-08-17 NOTE — Progress Notes (Signed)
ID:  Jeanne Schwartz, DOB September 08, 1946, MRN [id number]  PCP:  Heywood Bene, PA-C  Cardiologist:  Rex Kras, DO, [facility] (established care 05/22/2020) Former Cardiology Providers: NA  Date: 08/17/22 Last Office Visit: 07/22/2021  Chief Complaint  Patient presents with   Follow-up    1 yr   Shortness of Breath    HPI  Jeanne Schwartz is a 76 y.o. female whose past medical history and cardiovascular risk factors include: Hypertension, hyperlipidemia, postmenopausal female, advanced age  Patient was referred to the practice for evaluation of chest pressure and shortness of breath in the past.  She is undergone ischemic work-up as outlined below and presents today for 1 year follow-up visit. Over the last 1 year patient states that she is doing well from a cardiovascular standpoint and denies anginal discomfort or heart failure symptoms.  Her dyspnea is also very well controlled on current medical regimen.  She has a question regarding her pulse and her blood pressure.  Her blood pressures at home are around 120 mmHg and pulses are at 85 bpm.  She is no longer on AV nodal blocking agents.  FUNCTIONAL STATUS: 8,000 to 10,000 steps per day and aerobic exercise 20 mins 5 days a week.    ALLERGIES: Allergies  Allergen Reactions   Meclizine Anaphylaxis   Penicillins Shortness Of Breath   Sulfonamide Derivatives Shortness Of Breath   Prochlorperazine Edisylate Other (See Comments)    MEDICATION LIST PRIOR TO VISIT: Current Meds  Medication Sig   alendronate (FOSAMAX) 70 MG tablet Take 70 mg by mouth once a week.   atorvastatin (LIPITOR) 20 MG tablet Take 20 mg by mouth at bedtime.    Calcium Carb-Cholecalciferol (CALCIUM 600-D PO) Take 650 mg by mouth daily.   CRANBERRY PO Take 1 tablet by mouth daily at 12 noon.   Cyanocobalamin (B-12 PO) Take 1 tablet by mouth daily.   cycloSPORINE (RESTASIS) 0.05 % ophthalmic emulsion Place 1 drop into both eyes daily as needed (dry eyes).     Flaxseed, Linseed, (FLAX SEEDS PO) Take by mouth.   Multiple Vitamin (MULTIVITAMIN) capsule Take 1 capsule by mouth daily.    omeprazole (PRILOSEC) 20 MG capsule Take 20 mg by mouth daily.   vitamin C (ASCORBIC ACID) 250 MG tablet Take 250 mg by mouth daily.   [DISCONTINUED] lisinopril (ZESTRIL) 10 MG tablet Take 10 mg by mouth daily.      PAST MEDICAL HISTORY: Past Medical History:  Diagnosis Date   GERD (gastroesophageal reflux disease)    TAKES PRILOSEC   Headache(784.0)    SINCE NECK  THING   High cholesterol    TAKES LIPITOR   Hypertension    PONV (postoperative nausea and vomiting)    NOTHING IN THE LAST COUPLE YRS   Rotator cuff tear, right    JUST FOUND OUT THIS WEEK    PAST SURGICAL HISTORY: Past Surgical History:  Procedure Laterality Date   ABDOMINAL HYSTERECTOMY     ANTERIOR CERVICAL DECOMP/DISCECTOMY FUSION  10/01/2011   Procedure: ANTERIOR CERVICAL DECOMPRESSION/DISCECTOMY FUSION 2 LEVELS;  Surgeon: Marybelle Killings;  Location: [place];  Service: Orthopedics;  Laterality: N/A;  C5-6, C6-7 Anterior Cervical Discectomy and Fusion, Allograft and Plate   BLEPHAROPLASTY     2001   BREAST BIOPSY Left 11/20/2019   BREAST EXCISIONAL BIOPSY Right    BREAST SURGERY     10 YRS  BENIGN   FRACTURE SURGERY     LEFT  WRIST   LEFT HEART  CATH AND CORONARY ANGIOGRAPHY N/A 07/15/2020   Procedure: LEFT HEART CATH AND CORONARY ANGIOGRAPHY;  Surgeon: Nigel Mormon, MD;  Location: [place] [facility];  Service: Cardiovascular;  Laterality: N/A;   LEFT WRIST     METAL PLATE    SHOULDER SURGERY Right    arthroscopy and rotator cuff repair   TUBAL LIGATION      FAMILY HISTORY: The patient family history includes Breast cancer in her cousin; Diabetes in her maternal grandmother; Heart attack in her mother; Leukemia in her father.  SOCIAL HISTORY:  The patient  reports that she has never smoked. She has never used smokeless tobacco. She reports that she does not drink alcohol  and does not use drugs.  REVIEW OF SYSTEMS: Review of Systems  Constitutional: Negative for chills and fever.  HENT:  Negative for hoarse voice and nosebleeds.   Eyes:  Negative for discharge, double vision and pain.  Cardiovascular:  Negative for chest pain, claudication, dyspnea on exertion, near-syncope, orthopnea, palpitations, paroxysmal nocturnal dyspnea and syncope.  Respiratory:  Negative for hemoptysis and shortness of breath.   Musculoskeletal:  Negative for muscle cramps and myalgias.  Gastrointestinal:  Negative for abdominal pain, constipation, diarrhea, hematemesis, hematochezia, melena, nausea and vomiting.  Neurological:  Positive for vertigo (chronic). Negative for dizziness and light-headedness.    PHYSICAL EXAM:    09/07/2022    4:32 PM 09/03/2022    5:00 PM 09/03/2022    4:50 PM  Vitals with BMI  Height _0     Weight 150 lbs    BMI 55.73    Systolic 220 254 270  Diastolic 68 79 71  Pulse 84 72 70   CONSTITUTIONAL: Well-developed and well-nourished. No acute distress.  SKIN: Skin is warm and dry. No rash noted. No cyanosis. No pallor. No jaundice HEAD: Normocephalic and atraumatic.  EYES: No scleral icterus MOUTH/THROAT: Moist oral membranes.  NECK: No JVD present. No thyromegaly noted. No carotid bruits  CHEST Normal respiratory effort. No intercostal retractions  LUNGS: Clear to auscultation bilaterally.  No stridor. No wheezes. No rales.  CARDIOVASCULAR: Regular rate and rhythm, positive S1-S2, no murmurs rubs or gallops appreciated ABDOMINAL: Soft, nontender, nondistended, positive bowel sounds all 4 quadrants.  No apparent ascites.  EXTREMITIES: No peripheral edema, warm to touch bilaterally, 2+ DP and PT pulses. HEMATOLOGIC: No significant bruising NEUROLOGIC: Oriented to person, place, and time. Nonfocal. Normal muscle tone.  PSYCHIATRIC: Normal mood and affect. Normal behavior. Cooperative  CARDIAC DATABASE: EKG: 07/22/2021: Normal sinus  rhythm, 65 bpm, normal axis, without underlying ischemia or injury pattern. 08/17/2022: Normal sinus rhythm, without underlying ischemia injury pattern.  Echocardiogram: 06/05/2020:  Left ventricle cavity is normal in size. Mild concentric hypertrophy of the left ventricle. Normal global wall motion. Normal LV systolic function  with EF 55%. Normal diastolic filling pattern. Calculated EF 55%.  Mild tricuspid regurgitation. Estimated pulmonary artery systolic pressure 24 mmHg  Stress Testing: Lexiscan (Walking with mod Bruce)Tetrofosmin Stress Test  06/02/2020: Non-diagnostic ECG stress. There is decreased counts suggestive of reversible moderate defect in the inferior and apical regions, the defect is probably contributed by very small resting and stress  LV volume (65m) than true ischemia.  Overall LV systolic function is normal without regional wall motion abnormalities. Stress LV EF: 72%.  No previous exam available for comparison. Intermediate risk scan. Clinical correlation recommended.   Heart Catheterization: 07/15/2020: Angiographically normal epicardial coronary arteries with no coronary artery disease. Normal LVEDP.  LABORATORY DATA: External Labs: Collected: October 10, 2019 Creatinine 1.2 mg/dL. eGFR: 45 mL/min per 1.73 m Lipid profile: Total cholesterol 253, triglycerides 200, HDL 75, LDL 126, non-HDL 178  04/23/2020: BNP 45.1  IMPRESSION:    ICD-10-CM   1. Essential hypertension  I10 EKG 12-Lead    2. Mixed hyperlipidemia  E78.2     3. Dyspnea on exertion  R06.09        RECOMMENDATIONS: Jeanne Schwartz is a 76 y.o. female whose past medical history and cardiac risk factors include: Hypertension, hyperlipidemia, postmenopausal female, advanced age  Dyspnea on exertion Stable Denies angina pectoris. No use of sublingual nitroglycerin tablets. Reviewed ischemic work-up performed during prior office visit. EKG nonischemic. Would continue current medical  therapy.  Benign hypertension Office blood pressures are very well controlled. Medications reconciled. Currently managed by primary care provider.  Mixed hyperlipidemia Currently on atorvastatin.   She denies myalgia or other side effects. Currently managed by primary care provider.  FINAL MEDICATION LIST END OF ENCOUNTER: No orders of the defined types were placed in this encounter.    Current Outpatient Medications:    alendronate (FOSAMAX) 70 MG tablet, Take 70 mg by mouth once a week., Disp: , Rfl:    atorvastatin (LIPITOR) 20 MG tablet, Take 20 mg by mouth at bedtime. , Disp: , Rfl:    Calcium Carb-Cholecalciferol (CALCIUM 600-D PO), Take 650 mg by mouth daily., Disp: , Rfl:    CRANBERRY PO, Take 1 tablet by mouth daily at 12 noon., Disp: , Rfl:    Cyanocobalamin (B-12 PO), Take 1 tablet by mouth daily., Disp: , Rfl:    cycloSPORINE (RESTASIS) 0.05 % ophthalmic emulsion, Place 1 drop into both eyes daily as needed (dry eyes). , Disp: , Rfl:    Flaxseed, Linseed, (FLAX SEEDS PO), Take by mouth., Disp: , Rfl:    Multiple Vitamin (MULTIVITAMIN) capsule, Take 1 capsule by mouth daily. , Disp: , Rfl:    omeprazole (PRILOSEC) 20 MG capsule, Take 20 mg by mouth daily., Disp: , Rfl:    vitamin C (ASCORBIC ACID) 250 MG tablet, Take 250 mg by mouth daily., Disp: , Rfl:    lisinopril (ZESTRIL) 20 MG tablet, Take 20 mg by mouth daily., Disp: , Rfl:    oxyCODONE-acetaminophen (PERCOCET) 5-325 MG tablet, Take 1-2 tablets by mouth every 6 (six) hours as needed., Disp: 20 tablet, Rfl: 0  Orders Placed This Encounter  Procedures   EKG 12-Lead    There are no Patient Instructions on file for this visit.   --Continue cardiac medications as reconciled in final medication list. --Return in about 1 year (around 08/18/2023), or if symptoms worsen or fail to improve, for Annual follow up visit. Or sooner if needed. --Continue follow-up with your primary care physician regarding the management of  your other chronic comorbid conditions.  Patient's questions and concerns were addressed to her satisfaction. She voices understanding of the instructions provided during this encounter.   This note was created using a voice recognition software as a result there may be grammatical errors inadvertently enclosed that do not reflect the nature of this encounter. Every attempt is made to correct such errors.   Total time spent: 20 minutes.  Rex Kras, [place], [facility]  Pager: [phone] Office: [phone]

## 2022-09-03 ENCOUNTER — Other Ambulatory Visit: Payer: Self-pay

## 2022-09-03 ENCOUNTER — Emergency Department (HOSPITAL_BASED_OUTPATIENT_CLINIC_OR_DEPARTMENT_OTHER): Payer: Medicare HMO | Admitting: Radiology

## 2022-09-03 ENCOUNTER — Encounter (HOSPITAL_BASED_OUTPATIENT_CLINIC_OR_DEPARTMENT_OTHER): Payer: Self-pay

## 2022-09-03 ENCOUNTER — Other Ambulatory Visit (HOSPITAL_BASED_OUTPATIENT_CLINIC_OR_DEPARTMENT_OTHER): Payer: Self-pay

## 2022-09-03 ENCOUNTER — Emergency Department (HOSPITAL_BASED_OUTPATIENT_CLINIC_OR_DEPARTMENT_OTHER)
Admission: EM | Admit: 2022-09-03 | Discharge: 2022-09-03 | Disposition: A | Discharge status: Home / Self Care | Payer: Medicare HMO | Attending: Emergency Medicine | Admitting: Emergency Medicine

## 2022-09-03 DIAGNOSIS — S82852A Displaced trimalleolar fracture of left lower leg, initial encounter for closed fracture: Secondary | ICD-10-CM | POA: Diagnosis not present

## 2022-09-03 DIAGNOSIS — M7989 Other specified soft tissue disorders: Secondary | ICD-10-CM | POA: Insufficient documentation

## 2022-09-03 DIAGNOSIS — S99912A Unspecified injury of left ankle, initial encounter: Secondary | ICD-10-CM | POA: Diagnosis present

## 2022-09-03 DIAGNOSIS — Y9301 Activity, walking, marching and hiking: Secondary | ICD-10-CM | POA: Insufficient documentation

## 2022-09-03 DIAGNOSIS — Z79899 Other long term (current) drug therapy: Secondary | ICD-10-CM | POA: Diagnosis not present

## 2022-09-03 DIAGNOSIS — W108XXA Fall (on) (from) other stairs and steps, initial encounter: Secondary | ICD-10-CM | POA: Insufficient documentation

## 2022-09-03 DIAGNOSIS — I10 Essential (primary) hypertension: Secondary | ICD-10-CM | POA: Insufficient documentation

## 2022-09-03 MED ORDER — FENTANYL CITRATE PF 50 MCG/ML IJ SOSY
PREFILLED_SYRINGE | INTRAMUSCULAR | Status: AC
  Filled 2022-09-03: qty 1

## 2022-09-03 MED ORDER — HYDROCODONE-ACETAMINOPHEN 5-325 MG PO TABS: 1.0000 | ORAL_TABLET | ORAL | 0 refills | Status: AC | PRN

## 2022-09-03 MED ORDER — OXYCODONE-ACETAMINOPHEN 5-325 MG PO TABS
1.0000 | ORAL_TABLET | Freq: Four times a day (QID) | ORAL | 0 refills | Status: DC | PRN
  Filled 2022-09-03: qty 20, 5d supply, fill #0

## 2022-09-03 MED ORDER — PROPOFOL 10 MG/ML IV BOLUS
1.0000 mg/kg | Freq: Once | INTRAVENOUS | Status: AC
  Administered 2022-09-03: 68 mg via INTRAVENOUS
  Filled 2022-09-03: qty 20

## 2022-09-03 MED ORDER — MORPHINE SULFATE (PF) 4 MG/ML IV SOLN
4.0000 mg | Freq: Once | INTRAVENOUS | Status: AC
  Administered 2022-09-03: 4 mg via INTRAVENOUS
  Filled 2022-09-03: qty 1

## 2022-09-03 MED ORDER — FENTANYL CITRATE PF 50 MCG/ML IJ SOSY
50.0000 ug | PREFILLED_SYRINGE | Freq: Once | INTRAMUSCULAR | Status: AC
  Administered 2022-09-03: 50 ug via INTRAVENOUS

## 2022-09-03 NOTE — ED Notes (Signed)
RT note: Equipment s/u for conscious sedation.

## 2022-09-03 NOTE — ED Notes (Signed)
Unable to assess pain due to procedure sedation

## 2022-09-03 NOTE — ED Provider Notes (Signed)
I provided a substantive portion of the care of this patient.  I personally performed the entirety of the exam for this encounter.     Patient misstep going down stairs and came down on her left foot twisting her ankle badly outward and displacing the foot.  No other significant injury.  Is alert with clear mental status.  No respiratory distress.  Head normocephalic atraumatic.  Heart is regular.  Lungs are clear with symmetric breath sounds.  Airway widely patent.  Posterior airway easily visible.  Patient has her own dentition.  Normal range of motion of the neck. Lateral displacement and deformity of the left ankle with some contusion and very superficial abrasion.  Dorsalis pedis pulse is 2+ and strong.  Patient can move the toes to command.  X-ray personally reviewed by myself.  Positive for trimalleolar fracture with lateral dislocation.  Also reviewed radiology interpretation.  After reducing ankle I did review postreduction x-rays done at bedside.  Significant improvement in alignment and dislocation reduced.  I assessed the patient for sedation with propofol.  PA-C Soto reduced the ankle under my direct supervision.   Ankle splinted with posterior and stirrup Ortho-Glass splint.  Good placement.  Toes are warm and dry.  She is comfortable in the splint.  Consult: Reviewed with Dr. Willia Craze.  Requests postreduction films and splint and if appropriately relocated, okay for outpatient follow-up.  .Sedation  Date/Time: 09/03/2022 3:34 PM  Performed by: Arby Barrette, MD Authorized by: Arby Barrette, MD   Consent:    Consent obtained:  Verbal   Consent given by:  Patient   Risks discussed:  Allergic reaction, dysrhythmia, inadequate sedation, nausea, prolonged hypoxia resulting in organ damage, prolonged sedation necessitating reversal, respiratory compromise necessitating ventilatory assistance and intubation and vomiting Universal protocol:    Immediately prior to  procedure, a time out was called: yes     Patient identity confirmed:  Arm band Indications:    Procedure performed:  Dislocation reduction   Procedure necessitating sedation performed by:  Different physician Pre-sedation assessment:    Time since last food or drink:  12 hours   ASA classification: class 2 - patient with mild systemic disease     Mouth opening:  3 or more finger widths   Thyromental distance:  4 finger widths   Mallampati score:  I - soft palate, uvula, fauces, pillars visible   Neck mobility: normal     Pre-sedation assessments completed and reviewed: airway patency, cardiovascular function, hydration status, mental status, nausea/vomiting, pain level and respiratory function   Immediate pre-procedure details:    Reassessment: Patient reassessed immediately prior to procedure     Reviewed: vital signs, relevant labs/tests and NPO status     Verified: bag valve mask available, emergency equipment available, intubation equipment available, IV patency confirmed, oxygen available, reversal medications available and suction available   Procedure details (see MAR for exact dosages):    Preoxygenation:  Nasal cannula   Sedation:  Propofol   Intended level of sedation: deep   Intra-procedure monitoring:  Blood pressure monitoring, cardiac monitor, continuous pulse oximetry, continuous capnometry, frequent LOC assessments and frequent vital sign checks   Intra-procedure events: none     Total Provider sedation time (minutes):  20 Post-procedure details:    Post-sedation assessment completed:  09/03/2022 3:35 PM   Attendance: Constant attendance by certified staff until patient recovered     Recovery: Patient returned to pre-procedure baseline     Post-sedation assessments completed and reviewed: airway patency,  cardiovascular function, hydration status, mental status, nausea/vomiting, pain level and respiratory function     Patient is stable for discharge or admission: yes      Procedure completion:  Tolerated well, no immediate complications     Arby Barrette, MD 09/03/22 1540

## 2022-09-03 NOTE — ED Provider Notes (Signed)
[facility] EMERGENCY DEPT Provider Note   CSN: [id number] Arrival date & time: 09/03/22  1144     History HTN Chief Complaint  Patient presents with   Ankle Injury    Left ankle     Jeanne Schwartz is a 76 y.o. female.  76 y.o female with a PMH of HTN, High cholesterol presents to the ED with a chief complaint of left ankle pain s/p fall.  Patient reports he was walking down the stairs approximately 2 hours ago when suddenly she missed a couple of the steps, causing her to fall on her left ankle. She has no obvious deformity to the left ankle. Symptoms are exacerbated with any type of movement, he has not weight-bear since the incident.  She was given ice to her left ankle to help with symptoms with no improvement. She did strike her right leg but no injury to her head.   The history is provided by the patient and medical records.  Ankle Injury This is a new problem. The current episode started 1 to 2 hours ago. The problem occurs constantly. Pertinent negatives include no chest pain, no abdominal pain and no shortness of breath.       Home Medications Prior to Admission medications   Medication Sig Start Date End Date Taking? Authorizing Provider  alendronate (FOSAMAX) 70 MG tablet Take 70 mg by mouth once a week. 03/11/21   [provider]  atorvastatin (LIPITOR) 20 MG tablet Take 20 mg by mouth at bedtime.     [provider]  Calcium Carb-Cholecalciferol (CALCIUM 600-D PO) Take 650 mg by mouth daily.    [provider]  CRANBERRY PO Take 1 tablet by mouth daily at 12 noon.    [provider]  Cyanocobalamin (B-12 PO) Take 1 tablet by mouth daily.    [provider]  cycloSPORINE (RESTASIS) 0.05 % ophthalmic emulsion Place 1 drop into both eyes daily as needed (dry eyes).  09/27/19   [provider]  Flaxseed, Linseed, (FLAX SEEDS PO) Take by mouth.    [provider]  lisinopril (ZESTRIL) 20 MG  tablet Take 20 mg by mouth daily. 06/30/22   [provider]  Multiple Vitamin (MULTIVITAMIN) capsule Take 1 capsule by mouth daily.     [provider]  omeprazole (PRILOSEC) 20 MG capsule Take 20 mg by mouth daily.    [provider]  oxyCODONE-acetaminophen (PERCOCET/ROXICET) 5-325 MG tablet Take 1-2 tablets by mouth every 6 (six) hours as needed for severe pain. 09/13/22   Eldred Manges, MD  vitamin C (ASCORBIC ACID) 250 MG tablet Take 250 mg by mouth daily.    [provider]      Allergies    Meclizine, Penicillins, Sulfonamide derivatives, and Prochlorperazine edisylate    Review of Systems   Review of Systems  HENT:  Negative for sore throat.   Respiratory:  Negative for shortness of breath.   Cardiovascular:  Negative for chest pain.  Gastrointestinal:  Negative for abdominal pain, diarrhea and vomiting.  Musculoskeletal:  Positive for arthralgias.  All other systems reviewed and are negative.   Physical Exam Updated Vital Signs BP 132/79   Pulse 72   Temp 98.2 F (36.8 C) (Oral)   Resp 15   Ht 5\' 6"  (1.676 m)   Wt 68 kg   SpO2 98%   BMI 24.21 kg/m  Physical Exam Vitals and nursing note reviewed.  Constitutional:      Appearance: Normal appearance.  HENT:     Head: Normocephalic and atraumatic.     Comments: No visible signs of trauma.    Mouth/Throat:     Mouth: Mucous membranes are moist.  Eyes:     Pupils: Pupils are equal, round, and reactive to light.  Cardiovascular:     Rate and Rhythm: Normal rate.  Pulmonary:     Effort: Pulmonary effort is normal.     Breath sounds: No wheezing.  Abdominal:     General: Abdomen is flat.     Palpations: Abdomen is soft.  Musculoskeletal:        General: Deformity present.     Cervical back: Normal range of motion and neck supple.     Left ankle: Deformity present. Tenderness present.  Skin:    General: Skin is warm and dry.  Neurological:     Mental Status: She is alert and  oriented to person, place, and time.     ED Results / Procedures / Treatments   Labs (all labs ordered are listed, but only abnormal results are displayed) Labs Reviewed - No data to display  EKG None  Radiology No results found.  Procedures Reduction of fracture  Date/Time: 09/03/2022 3:20 PM  Performed by: Claude Manges, PA-C Authorized by: Claude Manges, PA-C  Consent: Verbal consent obtained. Consent given by: patient Imaging studies: imaging studies available Patient identity confirmed: verbally with patient Local anesthesia used: no  Anesthesia: Local anesthesia used: no Patient tolerance: patient tolerated the procedure well with no immediate complications Comments: Patient tolerated the procedure well, successful reduction of her dry mouth fracture.       Medications Ordered in ED Medications  propofol (DIPRIVAN) 10 mg/mL bolus/IV push 68 mg (68 mg Intravenous Given 09/03/22 1450)  morphine (PF) 4 MG/ML injection 4 mg (4 mg Intravenous Given 09/03/22 1403)  fentaNYL (SUBLIMAZE) injection 50 mcg (50 mcg Intravenous Given 09/03/22 1503)    ED Course/ Medical Decision Making/ A&P                           Medical Decision Making Amount and/or Complexity of Data Reviewed Radiology: ordered.  Risk Prescription drug management.   This patient presents to the ED for concern of left ankle pain, this involves a number of treatment options, and is a complaint that carries with it a high risk of complications and morbidity.  The differential diagnosis includes dislocation, fracture versus strain.    Co morbidities: Discussed in HPI   Brief History:  Patient here status post mechanical fall after walking down steps and missing the steps.  Severe pain to her left ankle with obvious deformity noted.  Has not ambulated since incident occurred.  Did not have any pain medication to help with symptoms.  No other injury, did not strike her head, no loss of  consciousness, currently on no blood thinners.  EMR reviewed including pt PMHx, past surgical history and past visits to ER.   See HPI for more details  Imaging Studies:  Xray of the left ankle showed: 1. Acute and comminuted fracture deformities of the distal fibula  and medial malleolus with lateral angulation of the distal fracture  fragments.  2. Lateral tibiotalar joint dislocation.   Medicines ordered:  I ordered medication including fentanyl  for pain control, morphine Reevaluation of the patient after these medicines showed that the patient improved I have reviewed the patients home medicines and have made adjustments as needed   Critical  Interventions:        Reduction of left ankle joint  Consults:  I requested consultation with Orthopedics,  and discussed lab and imaging findings as well as pertinent plan - they recommend: outpatient follow up. Call placed and received by my attending Dr. Donnald Garre.   Reevaluation:  After the interventions noted above I re-evaluated patient and found that they have :improved   Social Determinants of Health:  The patient's social determinants of health were a factor in the care of this patient  Problem List / ED Course:  She here status post mechanical fall down her steps missing approximately 3 steps.  No LOC, no headache, no head trauma.  Does have an obvious deformity to the left ankle, has not weight-bear since the incident occurred.  A significant amount of pain during evaluation but reassuring exam neurovascularly intact with 2+ DP, PT pulses, no tenting in the skin, compartments are soft.  X-ray did show a acute rupture of the distal fibula and medial was, this also was dislocated from the joint line therefore it required reduction. Conscious sedation was performed with my attending Dr. Clarice Pole along with propofol, technique reduction performed and successful reduction was obtained, x-rays were obtained to check reduction post  procedure.  She is a patient of Dr. Prince Solian, call to orthopedics was placed in order to obtain appropriate follow-up outpatient.  Dispostion:  After consideration of the diagnostic results and the patients response to treatment, I feel that the patent would benefit from outpatient follow up with orthopedics.    Portions of this note were generated with Scientist, clinical (histocompatibility and immunogenetics). Dictation errors may occur despite best attempts at proofreading.  Final Clinical Impression(s) / ED Diagnoses Final diagnoses:  Closed trimalleolar fracture of left ankle, initial encounter    Rx / DC Orders ED Discharge Orders          Ordered    HYDROcodone-acetaminophen (NORCO/VICODIN) 5-325 MG tablet  Every 4 hours PRN        09/03/22 1525    oxyCODONE-acetaminophen (PERCOCET) 5-325 MG tablet  Every 6 hours PRN,   Status:  Discontinued        09/03/22 1605              Claude Manges, PA-C 09/15/22 1135    Arby Barrette, MD 09/27/22 1503

## 2022-09-03 NOTE — Plan of Care (Signed)
Orthopedic Plan of Care Note  Patient has an ankle fracture/dislocation. Underwent reduction at [facility]. NVI. No open wounds. Patient interested in outpatient surgery.  Recommended getting post-reduction films in the splint. If she is reduced, can do outpatient surgery. Would need to see her in the office in 1 week. She is a patient of Dr. Ophelia Charter so she can see him or myself. If she does end up going home because the reduction is maintained, then she would need to elevate above the level of the heart frequently to reduce swelling.   London Sheer, MD Orthopedic Surgeon

## 2022-09-03 NOTE — ED Notes (Signed)
Pt placed on cardiac monitor, crash cart set-up outside of room, suction and yaunker set-up and on. PIV is placed and pain meds given. RT placing pt on CO2 monitor with O2 at this time. Consent has been signed by pt prior to morphine admin.

## 2022-09-03 NOTE — ED Triage Notes (Signed)
Pt states fell down stairs and possibly broke ankle. Visible deformity Palpable pulse on left foot. Pt's is cold to touch with visible swelling.

## 2022-09-03 NOTE — ED Notes (Signed)
During procedure propofol given  50 mg initial followed by 48md at 1453; 20mg  at 1455; 20mg  at 1455; and 20mg  at 1456

## 2022-09-03 NOTE — Discharge Instructions (Signed)
1.  You have a complex ankle fracture called a trimalleolar ankle fracture.  This means multiple bones are broken.  You will need surgery.  Call [facility] to schedule your follow-up appointment as soon as possible. 2.  You may not put any weight on your broken ankle.  Use a walker and assistance for any necessary activities.  Elevate your leg is much as possible above the level of the heart ideally.  Ply well wrapped ice packs over the outside the splint.  Try not to get the splint wet. 3.  Follow splint care precautions.  Return to emergency department immediately if you have cool numb blue or very painful toes or foot. 4.  You have been prescribed Percocet to take for pain.  You may take 1 to 2 tablets every 6 hours as needed.  Be careful regarding dizziness.  If you feel dizzy or unsteady taking the medication, lie down immediately.

## 2022-09-07 ENCOUNTER — Ambulatory Visit: Payer: Medicare HMO | Admitting: Orthopaedic Surgery

## 2022-09-07 ENCOUNTER — Encounter: Payer: Self-pay | Admitting: Orthopaedic Surgery

## 2022-09-07 VITALS — BP 134/68 | HR 84 | Ht 66.0 in | Wt 150.0 lb

## 2022-09-07 DIAGNOSIS — S82842A Displaced bimalleolar fracture of left lower leg, initial encounter for closed fracture: Secondary | ICD-10-CM

## 2022-09-08 DIAGNOSIS — S82842A Displaced bimalleolar fracture of left lower leg, initial encounter for closed fracture: Secondary | ICD-10-CM | POA: Insufficient documentation

## 2022-09-08 NOTE — Progress Notes (Signed)
Office Visit Note   Patient: Jeanne Schwartz           Date of Birth: 25-Jan-1946           MRN: [id number] Visit Date: 09/07/2022              Requested by: Roger Kill, PA-C [id number] [place],  [place] [address] PCP: Roger Kill, PA-C   Assessment & Plan: Visit Diagnoses:  1. Bimalleolar ankle fracture, left, closed, initial encounter     Plan: Patient was closed left bimalleolar ankle fracture had a fracture dislocation her fracture is unstable she will require ORIF bimalleolar ankle fracture we will set this up as an outpatient procedure with likely a popliteal block.  We discussed if her block is effective we can add some additional local if needed and she could go home.  Overnight stay if blocks not effective and postop pain is a problem.  We discussed operative fixation postoperative splint followed by postoperative cast until she is 6 weeks.  Questions elicited and answered potential for nonunion discussed she understands question proceed.  Follow-Up Instructions: No follow-ups on file.   Orders:  No orders of the defined types were placed in this encounter.  No orders of the defined types were placed in this encounter.     Procedures: No procedures performed   Clinical Data: No additional findings.   Subjective: Chief Complaint  Patient presents with   Left Ankle - Fracture    HPI 76 year old female seen with a new injury when she tripped and fell in 09/03/2022 suffering a closed bimalleolar ankle fracture dislocation.  She was reduced and splinted in the [facility] department with conscious sedation.  She has combination of the medial malleolus side and a Weber B fibular fracture.  She has Percocet for pain.  Long-term patient of mine who I did previous cervical fusion.  Bone density 2022 showed osteopenia.  Review of Systems past history of management of the shoulder and cervical fusion for spondylosis.  People  do   Objective: Vital Signs: BP 134/68 (BP Location: Left Arm, Patient Position: Sitting, Cuff Size: Normal)   Pulse 84   Ht 5\' 6"  (1.676 m)   Wt 150 lb (68 kg)   BMI 24.21 kg/m   Physical Exam Constitutional:      Appearance: She is well-developed.  HENT:     Head: Normocephalic.     Right Ear: External ear normal.     Left Ear: External ear normal. There is no impacted cerumen.  Eyes:     Pupils: Pupils are equal, round, and reactive to light.  Neck:     Thyroid: No thyromegaly.     Trachea: No tracheal deviation.  Cardiovascular:     Rate and Rhythm: Normal rate.  Pulmonary:     Effort: Pulmonary effort is normal.  Abdominal:     Palpations: Abdomen is soft.  Musculoskeletal:     Cervical back: No rigidity.     Left lower leg: Edema present.  Skin:    General: Skin is warm and dry.  Neurological:     Mental Status: She is alert and oriented to person, place, and time.  Psychiatric:        Behavior: Behavior normal.     Ortho Exam patient is in a stirrup splint she still having pain and pressure particularly with ankle slight dorsiflexion plantarflexion.  She has been using a knee roller.  Seshan the foot is  intact.  Splint is removed skin shows mild erythema over the fibula.  Extra padding in the splint with posterior stirrup splint added.  Specialty Comments:  No specialty comments available.  Imaging: No results found.   PMFS History: Patient Active Problem List   Diagnosis Date Noted   Bimalleolar ankle fracture, left, closed, initial encounter 09/08/2022   Exertional chest pain 07/14/2020   Abnormal stress test 07/14/2020   Trochanteric bursitis, left hip 09/18/2019   Impingement syndrome of left shoulder 06/06/2019   Cervical spondylosis 10/02/2011   ARTHRITIS 02/27/2008   GASTRITIS 01/16/2008   ABDOMINAL PAIN, EPIGASTRIC 01/15/2008   Past Medical History:  Diagnosis Date   GERD (gastroesophageal reflux disease)    TAKES PRILOSEC    Headache(784.0)    SINCE NECK  THING   High cholesterol    TAKES LIPITOR   Hypertension    PONV (postoperative nausea and vomiting)    NOTHING IN THE LAST COUPLE YRS   Rotator cuff tear, right    JUST FOUND OUT THIS WEEK    Family History  Problem Relation Age of Onset   Heart attack Mother    Leukemia Father    Diabetes Maternal Grandmother    Breast cancer Cousin     Past Surgical History:  Procedure Laterality Date   ABDOMINAL HYSTERECTOMY     ANTERIOR CERVICAL DECOMP/DISCECTOMY FUSION  10/01/2011   Procedure: ANTERIOR CERVICAL DECOMPRESSION/DISCECTOMY FUSION 2 LEVELS;  Surgeon: Eldred Manges;  Location: [facility] OR;  Service: Orthopedics;  Laterality: N/A;  C5-6, C6-7 Anterior Cervical Discectomy and Fusion, Allograft and Plate   BLEPHAROPLASTY     2001   BREAST BIOPSY Left 11/20/2019   BREAST EXCISIONAL BIOPSY Right    BREAST SURGERY     10 YRS  BENIGN   FRACTURE SURGERY     LEFT  WRIST   LEFT HEART CATH AND CORONARY ANGIOGRAPHY N/A 07/15/2020   Procedure: LEFT HEART CATH AND CORONARY ANGIOGRAPHY;  Surgeon: Elder Negus, MD;  Location: [facility] INVASIVE CV LAB;  Service: Cardiovascular;  Laterality: N/A;   LEFT WRIST     METAL PLATE    SHOULDER SURGERY Right    arthroscopy and rotator cuff repair   TUBAL LIGATION     Social History   Occupational History   Not on file  Tobacco Use   Smoking status: Never   Smokeless tobacco: Never  Vaping Use   Vaping Use: Never used  Substance and Sexual Activity   Alcohol use: No   Drug use: No   Sexual activity: Not on file

## 2022-09-09 ENCOUNTER — Telehealth: Payer: Self-pay | Admitting: Neurology

## 2022-09-09 NOTE — Telephone Encounter (Signed)
This is a patient of Dr. Jenne Pane at [facility]. He is wanting to get her scheduled to come here for botox as a new patient. Raynelle Fanning from his office called me and they are wondering if you want to see her in the office as a consult first or is it ok to just get her scheduled for a botox appointment with Dr. Jenne Pane in our office?  Note for me: Raynelle Fanning [phone]

## 2022-09-09 NOTE — Telephone Encounter (Addendum)
Spoke with Tori verbally on this pt scheduled for dec 19 12 check in at 1130.

## 2022-09-09 NOTE — Telephone Encounter (Signed)
Please give her a follow up visit 15 or 30 minutes before the scheduled injection time with Dr. Theadora Rama visit,   I will meet her first before do injection.

## 2022-09-09 NOTE — Telephone Encounter (Signed)
I did not see her have an appt with me yet, it is ok any Tuesday noon, just add her on 30 -60.minutes prior to her injection visit.  If it is Dec 5th or 12th, let her come in and check in at 11am, expecting to wait a while.

## 2022-09-13 ENCOUNTER — Other Ambulatory Visit: Payer: Self-pay | Admitting: Orthopaedic Surgery

## 2022-09-13 DIAGNOSIS — S82842A Displaced bimalleolar fracture of left lower leg, initial encounter for closed fracture: Secondary | ICD-10-CM

## 2022-09-13 MED ORDER — OXYCODONE-ACETAMINOPHEN 5-325 MG PO TABS: 1.0000 | ORAL_TABLET | Freq: Four times a day (QID) | ORAL | 0 refills | Status: DC | PRN

## 2022-09-20 NOTE — Telephone Encounter (Signed)
Dr. Jenne Pane office called and cancelled appt due to patient breaking her ankle. They will call us to make a new appt in the future when she is ready.

## 2022-09-21 ENCOUNTER — Ambulatory Visit (INDEPENDENT_AMBULATORY_CARE_PROVIDER_SITE_OTHER): Payer: Medicare HMO

## 2022-09-21 ENCOUNTER — Ambulatory Visit (INDEPENDENT_AMBULATORY_CARE_PROVIDER_SITE_OTHER): Payer: Medicare HMO | Admitting: Orthopaedic Surgery

## 2022-09-21 DIAGNOSIS — S82842A Displaced bimalleolar fracture of left lower leg, initial encounter for closed fracture: Secondary | ICD-10-CM | POA: Diagnosis not present

## 2022-09-21 NOTE — Progress Notes (Signed)
Post-Op Visit Note   Patient: Jeanne Schwartz           Date of Birth: 11/25/1945           MRN: [id number] Visit Date: 09/21/2022 PCP: Roger Kill, PA-C   Assessment & Plan: Staples intact swelling is down she still has some swelling from the fracture dislocation which was of bimalleolar.  Wrap applied and cam boot.  Return 1 week for likely staple removal and Steri-Strips.  Chief Complaint:  Chief Complaint  Patient presents with   Left Ankle - Routine Post Op   Visit Diagnoses:  1. Bimalleolar ankle fracture, left, closed, initial encounter     Plan: Return 1 week.  Follow-Up Instructions: No follow-ups on file.   Orders:  Orders Placed This Encounter  Procedures   XR Ankle Complete Left   No orders of the defined types were placed in this encounter.   Imaging:Three-view x-ray left ankle obtained and reviewed AP lateral mortise.  This shows medial lateral fixation with near anatomic position of the mortise and fractures.   Impression: Satisfactory left ankle ORIF bimalleolar ankle fracture.     PMFS History: Patient Active Problem List   Diagnosis Date Noted   Bimalleolar ankle fracture, left, closed, initial encounter 09/08/2022   Exertional chest pain 07/14/2020   Abnormal stress test 07/14/2020   Trochanteric bursitis, left hip 09/18/2019   Impingement syndrome of left shoulder 06/06/2019   Cervical spondylosis 10/02/2011   ARTHRITIS 02/27/2008   GASTRITIS 01/16/2008   ABDOMINAL PAIN, EPIGASTRIC 01/15/2008   Past Medical History:  Diagnosis Date   GERD (gastroesophageal reflux disease)    TAKES PRILOSEC   Headache(784.0)    SINCE NECK  THING   High cholesterol    TAKES LIPITOR   Hypertension    PONV (postoperative nausea and vomiting)    NOTHING IN THE LAST COUPLE YRS   Rotator cuff tear, right    JUST FOUND OUT THIS WEEK    Family History  Problem Relation Age of Onset   Heart attack Mother    Leukemia Father    Diabetes Maternal  Grandmother    Breast cancer Cousin     Past Surgical History:  Procedure Laterality Date   ABDOMINAL HYSTERECTOMY     ANTERIOR CERVICAL DECOMP/DISCECTOMY FUSION  10/01/2011   Procedure: ANTERIOR CERVICAL DECOMPRESSION/DISCECTOMY FUSION 2 LEVELS;  Surgeon: Eldred Manges;  Location: [facility];  Service: Orthopedics;  Laterality: N/A;  C5-6, C6-7 Anterior Cervical Discectomy and Fusion, Allograft and Plate   BLEPHAROPLASTY     2001   BREAST BIOPSY Left 11/20/2019   BREAST EXCISIONAL BIOPSY Right    BREAST SURGERY     10 YRS  BENIGN   FRACTURE SURGERY     LEFT  WRIST   LEFT HEART CATH AND CORONARY ANGIOGRAPHY N/A 07/15/2020   Procedure: LEFT HEART CATH AND CORONARY ANGIOGRAPHY;  Surgeon: Elder Negus, MD;  Location: [facility];  Service: Cardiovascular;  Laterality: N/A;   LEFT WRIST     METAL PLATE    SHOULDER SURGERY Right    arthroscopy and rotator cuff repair   TUBAL LIGATION     Social History   Occupational History   Not on file  Tobacco Use   Smoking status: Never   Smokeless tobacco: Never  Vaping Use   Vaping Use: Never used  Substance and Sexual Activity   Alcohol use: No   Drug use: No   Sexual activity: Not on file

## 2022-09-21 NOTE — Telephone Encounter (Signed)
Noted, thank you

## 2022-09-28 ENCOUNTER — Encounter: Payer: Self-pay | Admitting: Orthopaedic Surgery

## 2022-09-28 ENCOUNTER — Ambulatory Visit (INDEPENDENT_AMBULATORY_CARE_PROVIDER_SITE_OTHER): Payer: Medicare HMO | Admitting: Orthopaedic Surgery

## 2022-09-28 ENCOUNTER — Ambulatory Visit: Payer: Medicare HMO | Admitting: Neurology

## 2022-09-28 VITALS — BP 124/85 | HR 109 | Ht 66.0 in | Wt 150.0 lb

## 2022-09-28 DIAGNOSIS — M654 Radial styloid tenosynovitis [de Quervain]: Secondary | ICD-10-CM

## 2022-09-28 DIAGNOSIS — S82842A Displaced bimalleolar fracture of left lower leg, initial encounter for closed fracture: Secondary | ICD-10-CM

## 2022-09-28 MED ORDER — OXYCODONE-ACETAMINOPHEN 5-325 MG PO TABS: 1.0000 | ORAL_TABLET | Freq: Four times a day (QID) | ORAL | 0 refills | Status: DC | PRN

## 2022-09-28 NOTE — Progress Notes (Addendum)
Office Visit Note   Patient: Jeanne Schwartz           Date of Birth: 09/06/1946           MRN: [id number] Visit Date: 09/28/2022              Requested by: Roger Kill, PA-C [address] [place],  [place] [address] PCP: Roger Kill, PA-C   Assessment & Plan: Visit Diagnoses: Left bimalleolar ankle fracture, postop   Plan: Staples removed Steri-Strips applied short leg fiberglass cast still nonweightbearing return in 4 to 5 weeks for cast off and x-rays.  She will bring her cam boot with her when she comes back.  Follow-Up Instructions: No follow-ups on file.   Orders:  No orders of the defined types were placed in this encounter.  Meds ordered this encounter  Medications   oxyCODONE-acetaminophen (PERCOCET/ROXICET) 5-325 MG tablet    Sig: Take 1-2 tablets by mouth every 6 (six) hours as needed for severe pain.    Dispense:  40 tablet    Refill:  0    Post op pain      Procedures: No procedures performed   Clinical Data: No additional findings.   Subjective: Chief Complaint  Patient presents with   Left Ankle - Routine Post Op    09/13/2022 ORIF left ankle    HPI follow-up bimalleolar ankle fracture postop medial lateral fixation.  2 weeks out Staples removed short leg fiberglass cast applied follow-up 4 to 5 weeks for cast off x-rays and then reapplication of her cam boot.  Patient wants to keep the cast where she is going to the beach with her family on 19 January.  Percocet renewed.  Review of Systemsno change   Objective: Vital Signs: BP 124/85   Pulse (!) 109   Ht 5\' 6"  (1.676 m)   Wt 150 lb (68 kg)   BMI 24.21 kg/m   Physical Examneg  Ortho Exam mild swelling staples are ready for removal.  Specialty Comments:  No specialty comments available.  Imaging: No results found.   PMFS History: Patient Active Problem List   Diagnosis Date Noted   Tendinitis, de Quervain's 09/28/2022   Bimalleolar ankle fracture, left,  closed, initial encounter 09/08/2022   Exertional chest pain 07/14/2020   Abnormal stress test 07/14/2020   Trochanteric bursitis, left hip 09/18/2019   Impingement syndrome of left shoulder 06/06/2019   Cervical spondylosis 10/02/2011   ARTHRITIS 02/27/2008   GASTRITIS 01/16/2008   ABDOMINAL PAIN, EPIGASTRIC 01/15/2008   Past Medical History:  Diagnosis Date   GERD (gastroesophageal reflux disease)    TAKES PRILOSEC   Headache(784.0)    SINCE NECK  THING   High cholesterol    TAKES LIPITOR   Hypertension    PONV (postoperative nausea and vomiting)    NOTHING IN THE LAST COUPLE YRS   Rotator cuff tear, right    JUST FOUND OUT THIS WEEK    Family History  Problem Relation Age of Onset   Heart attack Mother    Leukemia Father    Diabetes Maternal Grandmother    Breast cancer Cousin     Past Surgical History:  Procedure Laterality Date   ABDOMINAL HYSTERECTOMY     ANTERIOR CERVICAL DECOMP/DISCECTOMY FUSION  10/01/2011   Procedure: ANTERIOR CERVICAL DECOMPRESSION/DISCECTOMY FUSION 2 LEVELS;  Surgeon: 10/03/2011;  Location: [facility];  Service: Orthopedics;  Laterality: N/A;  C5-6, C6-7 Anterior Cervical Discectomy and Fusion, Allograft and Plate  BLEPHAROPLASTY     2001   BREAST BIOPSY Left 11/20/2019   BREAST EXCISIONAL BIOPSY Right    BREAST SURGERY     10 YRS  BENIGN   FRACTURE SURGERY     LEFT  WRIST   LEFT HEART CATH AND CORONARY ANGIOGRAPHY N/A 07/15/2020   Procedure: LEFT HEART CATH AND CORONARY ANGIOGRAPHY;  Surgeon: Elder Negus, MD;  [facility];  Service: Cardiovascular;  Laterality: N/A;   LEFT WRIST     METAL PLATE    SHOULDER SURGERY Right    arthroscopy and rotator cuff repair   TUBAL LIGATION     Social History   Occupational History   Not on file  Tobacco Use   Smoking status: Never   Smokeless tobacco: Never  Vaping Use   Vaping Use: Never used  Substance and Sexual Activity   Alcohol use: No   Drug use: No   Sexual  activity: Not on file

## 2022-10-20 ENCOUNTER — Telehealth: Payer: Self-pay | Admitting: Orthopaedic Surgery

## 2022-10-20 NOTE — Telephone Encounter (Signed)
Patient called advised her foot is still swollen and she can hardly get her boot on. Patient said she is in a great deal of pain and asked if she can get an appointment sooner than 11/02/2022? Patient said she do no want any pain medication. The number to contact patient is [phone]

## 2022-10-21 ENCOUNTER — Encounter: Payer: Self-pay | Admitting: Physician Assistant

## 2022-10-21 ENCOUNTER — Ambulatory Visit (INDEPENDENT_AMBULATORY_CARE_PROVIDER_SITE_OTHER): Payer: Medicare HMO

## 2022-10-21 ENCOUNTER — Ambulatory Visit (INDEPENDENT_AMBULATORY_CARE_PROVIDER_SITE_OTHER): Payer: Medicare HMO | Admitting: Physician Assistant

## 2022-10-21 VITALS — Ht 66.0 in | Wt 150.0 lb

## 2022-10-21 DIAGNOSIS — S82842A Displaced bimalleolar fracture of left lower leg, initial encounter for closed fracture: Secondary | ICD-10-CM

## 2022-10-21 MED ORDER — GABAPENTIN 100 MG PO CAPS: 100.0000 mg | ORAL_CAPSULE | Freq: Three times a day (TID) | ORAL | 0 refills | Status: DC

## 2022-10-21 NOTE — Progress Notes (Signed)
Office Visit Note   Patient: Jeanne Schwartz           Date of Birth: June 08, 1946           MRN: [id number] Visit Date: 10/21/2022              Requested by: Heywood Bene, PA-C [address] [place],  [place] [id number] PCP: Heywood Bene, PA-C  Chief Complaint  Patient presents with   Left Ankle - Routine Post Op    09/13/2022 ORIF left ankle-[place]      HPI: Jeanne Schwartz is a pleasant 77 year old woman who is 5 weeks status post open reduction internal fixation of left bimalleolar ankle fracture with Dr. Lorin Mercy.  She is due to see him in the next week or so.  She comes in today because she feels like she has had some increased swelling in the ankle.  She also is describing neuropathic type pain like something is scrubbing against her leg.  She denies any particular particular calf pain she denies any fever or chills.  She takes very little pain medication and has a family history of addiction so is very conscientious of this.  Assessment & Plan: Visit Diagnoses:  1. Bimalleolar ankle fracture, left, closed, initial encounter     Plan: She actually looks quite good today x-rays show the fracture is healing well in good alignment.  She seems very sensitive even to light palpation.  Much of her pain seems neuropathic in nature.  While I still would like her to wear her boot when she is up and about and remain nonweightbearing until she sees Dr. Lorin Mercy I think it would be reasonable for her to remove her boot and begin gentle ankle range of motion.  She also could do some desensitization.  She has not washed her leg since surgery and she has a well-healed surgical incision so I encouraged her to use antibacterial soap and wash her leg and apply lotion just not over the incisions.  I think she might benefit from a course of gabapentin to decrease some of her neuropathic pain.  I see no evidence for any infective process  Follow-Up Instructions: Return in about 1 week (around  10/28/2022).   Ortho Exam  Patient is alert, oriented, no adenopathy, well-dressed, normal affect, normal respiratory effort. Examination she has well-healed surgical incisions.  Her compartments are soft and nontender she is able to actively slightly bend her ankle.  Her pulses are intact her foot is warm her sensation is intact she does have some hypersensitivity to light touch.  Mostly over the anterior of her ankle and medially.  Negative Homans' sign.  Very little soft tissue swelling no induration  Imaging: XR Ankle Complete Left  Result Date: 10/21/2022 Radiographs of her left ankle were reviewed today.  She is status post ORIF of a left bimalleolar ankle fracture.  Hardware is intact and in place.  She has excellent alignment and healing of the fracture with good bony bridging  No images are attached to the encounter.  Labs: No results found for: "HGBA1C", "ESRSEDRATE", "CRP", "LABURIC", "REPTSTATUS", "GRAMSTAIN", "CULT", "LABORGA"   Lab Results  Component Value Date   ALBUMIN 3.9 09/14/2012   ALBUMIN 4.1 09/28/2011    Lab Results  Component Value Date   MG 2.0 06/23/2020   No results found for: "VD25OH"  No results found for: "PREALBUMIN"    Latest Ref Rng & Units 07/15/2020   10:25 AM 09/14/2012   12:00  PM 09/28/2011    3:16 PM  CBC EXTENDED  WBC 4.0 - 10.5 K/uL 5.7  5.8  8.4   RBC 3.87 - 5.11 MIL/uL 4.72  4.58  4.61   Hemoglobin 12.0 - 15.0 g/dL 14.0  13.4  14.3   HCT 36.0 - 46.0 % 43.5  39.6  42.4   Platelets 150 - 400 K/uL 246  261  248      Body mass index is 24.21 kg/m.  Orders:  Orders Placed This Encounter  Procedures   XR Ankle Complete Left   Meds ordered this encounter  Medications   gabapentin (NEURONTIN) 100 MG capsule    Sig: Take 1 capsule (100 mg total) by mouth 3 (three) times daily.    Dispense:  60 capsule    Refill:  0     Procedures: No procedures performed  Clinical Data: No additional findings.  ROS:  All other systems  negative, except as noted in the HPI. Review of Systems  Objective: Vital Signs: Ht 5\' 6"  (1.676 m)   Wt 150 lb (68 kg)   BMI 24.21 kg/m   Specialty Comments:  No specialty comments available.  PMFS History: Patient Active Problem List   Diagnosis Date Noted   Bimalleolar ankle fracture, left, closed, initial encounter 09/08/2022   Exertional chest pain 07/14/2020   Abnormal stress test 07/14/2020   Trochanteric bursitis, left hip 09/18/2019   Impingement syndrome of left shoulder 06/06/2019   Cervical spondylosis 10/02/2011   ARTHRITIS 02/27/2008   GASTRITIS 01/16/2008   ABDOMINAL PAIN, EPIGASTRIC 01/15/2008   Past Medical History:  Diagnosis Date   GERD (gastroesophageal reflux disease)    TAKES PRILOSEC   Headache(784.0)    SINCE NECK  THING   High cholesterol    TAKES LIPITOR   Hypertension    PONV (postoperative nausea and vomiting)    NOTHING IN THE LAST COUPLE YRS   Rotator cuff tear, right    JUST FOUND OUT THIS WEEK    Family History  Problem Relation Age of Onset   Heart attack Mother    Leukemia Father    Diabetes Maternal Grandmother    Breast cancer Cousin     Past Surgical History:  Procedure Laterality Date   ABDOMINAL HYSTERECTOMY     ANTERIOR CERVICAL DECOMP/DISCECTOMY FUSION  10/01/2011   Procedure: ANTERIOR CERVICAL DECOMPRESSION/DISCECTOMY FUSION 2 LEVELS;  Surgeon: Marybelle Killings;  Location: [facility];  Service: Orthopedics;  Laterality: N/A;  C5-6, C6-7 Anterior Cervical Discectomy and Fusion, Allograft and Plate   BLEPHAROPLASTY     2001   BREAST BIOPSY Left 11/20/2019   BREAST EXCISIONAL BIOPSY Right    BREAST SURGERY     10 YRS  BENIGN   FRACTURE SURGERY     LEFT  WRIST   LEFT HEART CATH AND CORONARY ANGIOGRAPHY N/A 07/15/2020   Procedure: LEFT HEART CATH AND CORONARY ANGIOGRAPHY;  Surgeon: Nigel Mormon, MD;  Location: [facility];  Service: Cardiovascular;  Laterality: N/A;   LEFT WRIST     METAL PLATE    SHOULDER  SURGERY Right    arthroscopy and rotator cuff repair   TUBAL LIGATION     Social History   Occupational History   Not on file  Tobacco Use   Smoking status: Never   Smokeless tobacco: Never  Vaping Use   Vaping Use: Never used  Substance and Sexual Activity   Alcohol use: No   Drug use: No   Sexual activity: Not  on file

## 2022-10-21 NOTE — Telephone Encounter (Signed)
I spoke with patient. She states that she is elevating higher than heart and only putting leg down if needs to get around. She had a wrist surgery years ago and had problems in which she had to go and get blocks at the surgical center for an extended period of time. She does not know if that is what is happening with her ankle. She denies calf tightness or pain. She stats that the toes look completely normal and that the pain and swelling is more in the ankle and foot. Worked in this afternoon with Bevely Palmer as Dr. Lorin Mercy is out of the office.

## 2022-11-02 ENCOUNTER — Ambulatory Visit (INDEPENDENT_AMBULATORY_CARE_PROVIDER_SITE_OTHER): Payer: Medicare HMO

## 2022-11-02 ENCOUNTER — Ambulatory Visit (INDEPENDENT_AMBULATORY_CARE_PROVIDER_SITE_OTHER): Payer: Medicare HMO | Admitting: Orthopaedic Surgery

## 2022-11-02 ENCOUNTER — Encounter: Payer: Self-pay | Admitting: Orthopaedic Surgery

## 2022-11-02 VITALS — BP 139/89 | HR 100 | Ht 66.0 in | Wt 150.0 lb

## 2022-11-02 DIAGNOSIS — S82842A Displaced bimalleolar fracture of left lower leg, initial encounter for closed fracture: Secondary | ICD-10-CM

## 2022-11-02 NOTE — Progress Notes (Deleted)
Post-Op Visit Note   Patient: Jeanne Schwartz           Date of Birth: [birth year]           MRN: [id number] Visit Date: 11/02/2022 PCP: Heywood Bene, PA-C   Assessment & Plan: Follow-up bimalleolar ankle fracture dislocation.  X-rays today demonstrates progressive healing both sides.  Incisions look good she needs more soap and water as well as lotion.  Begin therapy weightbearing as tolerated.  Chief Complaint:  Chief Complaint  Patient presents with   Left Ankle - Routine Post Op, Follow-up    09/13/2022 ORIF left ankle fracture   Visit Diagnoses:  1. Bimalleolar ankle fracture, left, closed, initial encounter     Plan: Patient can be weightbearing as tolerated ankle range of motion exercises more lotion and motion.  Recheck 1 month.  Follow-Up Instructions: Return in about 1 month (around 12/03/2022).   Orders:  Orders Placed This Encounter  Procedures   XR Ankle Complete Left   No orders of the defined types were placed in this encounter.   Imaging: No results found.  PMFS History: Patient Active Problem List   Diagnosis Date Noted   Bimalleolar ankle fracture, left, closed, initial encounter 09/08/2022   Exertional chest pain 07/14/2020   Abnormal stress test 07/14/2020   Trochanteric bursitis, left hip 09/18/2019   Impingement syndrome of left shoulder 06/06/2019   Cervical spondylosis 10/02/2011   ARTHRITIS 02/27/2008   GASTRITIS 01/16/2008   ABDOMINAL PAIN, EPIGASTRIC 01/15/2008   Past Medical History:  Diagnosis Date   GERD (gastroesophageal reflux disease)    TAKES PRILOSEC   Headache(784.0)    SINCE NECK  THING   High cholesterol    TAKES LIPITOR   Hypertension    PONV (postoperative nausea and vomiting)    NOTHING IN THE LAST COUPLE YRS   Rotator cuff tear, right    JUST FOUND OUT THIS WEEK    Family History  Problem Relation Age of Onset   Heart attack Mother    Leukemia Father    Diabetes Maternal Grandmother    Breast cancer  Cousin     Past Surgical History:  Procedure Laterality Date   ABDOMINAL HYSTERECTOMY     ANTERIOR CERVICAL DECOMP/DISCECTOMY FUSION  10/01/2011   Procedure: ANTERIOR CERVICAL DECOMPRESSION/DISCECTOMY FUSION 2 LEVELS;  Surgeon: Marybelle Killings;  Location: [facility];  Service: Orthopedics;  Laterality: N/A;  C5-6, C6-7 Anterior Cervical Discectomy and Fusion, Allograft and Plate   BLEPHAROPLASTY     2001   BREAST BIOPSY Left 11/20/2019   BREAST EXCISIONAL BIOPSY Right    BREAST SURGERY     10 YRS  BENIGN   FRACTURE SURGERY     LEFT  WRIST   LEFT HEART CATH AND CORONARY ANGIOGRAPHY N/A 07/15/2020   Procedure: LEFT HEART CATH AND CORONARY ANGIOGRAPHY;  Surgeon: Nigel Mormon, MD;  Location: [facility];  Service: Cardiovascular;  Laterality: N/A;   LEFT WRIST     METAL PLATE    SHOULDER SURGERY Right    arthroscopy and rotator cuff repair   TUBAL LIGATION     Social History   Occupational History   Not on file  Tobacco Use   Smoking status: Never   Smokeless tobacco: Never  Vaping Use   Vaping Use: Never used  Substance and Sexual Activity   Alcohol use: No   Drug use: No   Sexual activity: Not on file      Post-Op Visit  Note   Patient: Jeanne Schwartz           Date of Birth: [birth year]           MRN: [id number] Visit Date: 11/02/2022 PCP: Heywood Bene, PA-C   Assessment & Plan:  Chief Complaint:  Chief Complaint  Patient presents with   Left Ankle - Routine Post Op, Follow-up    09/13/2022 ORIF left ankle fracture   Visit Diagnoses:  1. Bimalleolar ankle fracture, left, closed, initial encounter     Plan: ***  Follow-Up Instructions: No follow-ups on file.   Orders:  Orders Placed This Encounter  Procedures   XR Ankle Complete Left   No orders of the defined types were placed in this encounter.   Imaging: No results found.  PMFS History: Patient Active Problem List   Diagnosis Date Noted   Bimalleolar ankle fracture, left,  closed, initial encounter 09/08/2022   Exertional chest pain 07/14/2020   Abnormal stress test 07/14/2020   Trochanteric bursitis, left hip 09/18/2019   Impingement syndrome of left shoulder 06/06/2019   Cervical spondylosis 10/02/2011   ARTHRITIS 02/27/2008   GASTRITIS 01/16/2008   ABDOMINAL PAIN, EPIGASTRIC 01/15/2008   Past Medical History:  Diagnosis Date   GERD (gastroesophageal reflux disease)    TAKES PRILOSEC   Headache(784.0)    SINCE NECK  THING   High cholesterol    TAKES LIPITOR   Hypertension    PONV (postoperative nausea and vomiting)    NOTHING IN THE LAST COUPLE YRS   Rotator cuff tear, right    JUST FOUND OUT THIS WEEK    Family History  Problem Relation Age of Onset   Heart attack Mother    Leukemia Father    Diabetes Maternal Grandmother    Breast cancer Cousin     Past Surgical History:  Procedure Laterality Date   ABDOMINAL HYSTERECTOMY     ANTERIOR CERVICAL DECOMP/DISCECTOMY FUSION  10/01/2011   Procedure: ANTERIOR CERVICAL DECOMPRESSION/DISCECTOMY FUSION 2 LEVELS;  Surgeon: Marybelle Killings;  Location: [facility];  Service: Orthopedics;  Laterality: N/A;  C5-6, C6-7 Anterior Cervical Discectomy and Fusion, Allograft and Plate   BLEPHAROPLASTY     2001   BREAST BIOPSY Left 11/20/2019   BREAST EXCISIONAL BIOPSY Right    BREAST SURGERY     10 YRS  BENIGN   FRACTURE SURGERY     LEFT  WRIST   LEFT HEART CATH AND CORONARY ANGIOGRAPHY N/A 07/15/2020   Procedure: LEFT HEART CATH AND CORONARY ANGIOGRAPHY;  Surgeon: Nigel Mormon, MD;  Location: [facility];  Service: Cardiovascular;  Laterality: N/A;   LEFT WRIST     METAL PLATE    SHOULDER SURGERY Right    arthroscopy and rotator cuff repair   TUBAL LIGATION     Social History   Occupational History   Not on file  Tobacco Use   Smoking status: Never   Smokeless tobacco: Never  Vaping Use   Vaping Use: Never used  Substance and Sexual Activity   Alcohol use: No   Drug use: No   Sexual  activity: Not on file

## 2022-11-02 NOTE — Progress Notes (Signed)
Post-Op Visit Note   Patient: Jeanne Schwartz           Date of Birth: [birth year]           MRN: [id number] Visit Date: 11/02/2022 PCP: Roger Kill, PA-C   Assessment & Plan: Follow-up bimalleolar ankle fracture dislocation.  X-rays today demonstrates progressive healing both sides.  Incisions look good she needs more soap and water as well as lotion.  Begin therapy weightbearing as tolerated.  Chief Complaint:  Chief Complaint  Patient presents with   Left Ankle - Routine Post Op, Follow-up    09/13/2022 ORIF left ankle fracture   Visit Diagnoses:  1. Bimalleolar ankle fracture, left, closed, initial encounter     Plan: Patient can be weightbearing as tolerated ankle range of motion exercises more lotion and motion.  Recheck 1 month.  Follow-Up Instructions: Return in about 1 month (around 12/03/2022).   Orders:  Orders Placed This Encounter  Procedures   XR Ankle Complete Left   No orders of the defined types were placed in this encounter.   Imaging: XR Ankle Complete Left  Result Date: 11/02/2022 Three-view x-rays show 2 screw fixation malleolar fracture with progressive interval healing.  Lateral fibular fixation with oblique fracture appears healed and consolidated with ankle joint well reduced. Impression: Post ORIF medial lateral malleolus left ankle with healed fibular fracture and progressive healing medial side.   PMFS History: Patient Active Problem List   Diagnosis Date Noted   Bimalleolar ankle fracture, left, closed, initial encounter 09/08/2022   Exertional chest pain 07/14/2020   Abnormal stress test 07/14/2020   Trochanteric bursitis, left hip 09/18/2019   Impingement syndrome of left shoulder 06/06/2019   Cervical spondylosis 10/02/2011   ARTHRITIS 02/27/2008   GASTRITIS 01/16/2008   ABDOMINAL PAIN, EPIGASTRIC 01/15/2008   Past Medical History:  Diagnosis Date   GERD (gastroesophageal reflux disease)    TAKES PRILOSEC    Headache(784.0)    SINCE NECK  THING   High cholesterol    TAKES LIPITOR   Hypertension    PONV (postoperative nausea and vomiting)    NOTHING IN THE LAST COUPLE YRS   Rotator cuff tear, right    JUST FOUND OUT THIS WEEK    Family History  Problem Relation Age of Onset   Heart attack Mother    Leukemia Father    Diabetes Maternal Grandmother    Breast cancer Cousin     Past Surgical History:  Procedure Laterality Date   ABDOMINAL HYSTERECTOMY     ANTERIOR CERVICAL DECOMP/DISCECTOMY FUSION  10/01/2011   Procedure: ANTERIOR CERVICAL DECOMPRESSION/DISCECTOMY FUSION 2 LEVELS;  Surgeon: Eldred Manges;  Location: [facility];  Service: Orthopedics;  Laterality: N/A;  C5-6, C6-7 Anterior Cervical Discectomy and Fusion, Allograft and Plate   BLEPHAROPLASTY     2001   BREAST BIOPSY Left 11/20/2019   BREAST EXCISIONAL BIOPSY Right    BREAST SURGERY     10 YRS  BENIGN   FRACTURE SURGERY     LEFT  WRIST   LEFT HEART CATH AND CORONARY ANGIOGRAPHY N/A 07/15/2020   Procedure: LEFT HEART CATH AND CORONARY ANGIOGRAPHY;  Surgeon: Elder Negus, MD;  Location: [facility];  Service: Cardiovascular;  Laterality: N/A;   LEFT WRIST     METAL PLATE    SHOULDER SURGERY Right    arthroscopy and rotator cuff repair   TUBAL LIGATION     Social History   Occupational History   Not on file  Tobacco Use   Smoking status: Never   Smokeless tobacco: Never  Vaping Use   Vaping Use: Never used  Substance and Sexual Activity   Alcohol use: No   Drug use: No   Sexual activity: Not on file      Post-Op Visit Note   Patient: Jeanne Schwartz           Date of Birth: [birth year]           MRN: [id number] Visit Date: 11/02/2022 PCP: Heywood Bene, PA-C   Assessment & Plan:see above  Chief Complaint:  Chief Complaint  Patient presents with   Left Ankle - Routine Post Op, Follow-up    09/13/2022 ORIF left ankle fracture   Visit Diagnoses:  1. Bimalleolar ankle fracture, left,  closed, initial encounter     Plan: see above  Follow-Up Instructions: Return in about 1 month (around 12/03/2022).   Orders:  Orders Placed This Encounter  Procedures   XR Ankle Complete Left   No orders of the defined types were placed in this encounter.   Imaging: XR Ankle Complete Left  Result Date: 11/02/2022 Three-view x-rays show 2 screw fixation malleolar fracture with progressive interval healing.  Lateral fibular fixation with oblique fracture appears healed and consolidated with ankle joint well reduced. Impression: Post ORIF medial lateral malleolus left ankle with healed fibular fracture and progressive healing medial side.   PMFS History: Patient Active Problem List   Diagnosis Date Noted   Bimalleolar ankle fracture, left, closed, initial encounter 09/08/2022   Exertional chest pain 07/14/2020   Abnormal stress test 07/14/2020   Trochanteric bursitis, left hip 09/18/2019   Impingement syndrome of left shoulder 06/06/2019   Cervical spondylosis 10/02/2011   ARTHRITIS 02/27/2008   GASTRITIS 01/16/2008   ABDOMINAL PAIN, EPIGASTRIC 01/15/2008   Past Medical History:  Diagnosis Date   GERD (gastroesophageal reflux disease)    TAKES PRILOSEC   Headache(784.0)    SINCE NECK  THING   High cholesterol    TAKES LIPITOR   Hypertension    PONV (postoperative nausea and vomiting)    NOTHING IN THE LAST COUPLE YRS   Rotator cuff tear, right    JUST FOUND OUT THIS WEEK    Family History  Problem Relation Age of Onset   Heart attack Mother    Leukemia Father    Diabetes Maternal Grandmother    Breast cancer Cousin     Past Surgical History:  Procedure Laterality Date   ABDOMINAL HYSTERECTOMY     ANTERIOR CERVICAL DECOMP/DISCECTOMY FUSION  10/01/2011   Procedure: ANTERIOR CERVICAL DECOMPRESSION/DISCECTOMY FUSION 2 LEVELS;  Surgeon: Marybelle Killings;  Location: [facility];  Service: Orthopedics;  Laterality: N/A;  C5-6, C6-7 Anterior Cervical Discectomy and Fusion,  Allograft and Plate   BLEPHAROPLASTY     2001   BREAST BIOPSY Left 11/20/2019   BREAST EXCISIONAL BIOPSY Right    BREAST SURGERY     10 YRS  BENIGN   FRACTURE SURGERY     LEFT  WRIST   LEFT HEART CATH AND CORONARY ANGIOGRAPHY N/A 07/15/2020   Procedure: LEFT HEART CATH AND CORONARY ANGIOGRAPHY;  Surgeon: Nigel Mormon, MD;  Location: [facility];  Service: Cardiovascular;  Laterality: N/A;   LEFT WRIST     METAL PLATE    SHOULDER SURGERY Right    arthroscopy and rotator cuff repair   TUBAL LIGATION     Social History   Occupational History   Not on file  Tobacco Use   Smoking status: Never   Smokeless tobacco: Never  Vaping Use   Vaping Use: Never used  Substance and Sexual Activity   Alcohol use: No   Drug use: No   Sexual activity: Not on file

## 2022-11-08 ENCOUNTER — Telehealth: Payer: Self-pay | Admitting: Orthopaedic Surgery

## 2022-11-08 DIAGNOSIS — S82842A Displaced bimalleolar fracture of left lower leg, initial encounter for closed fracture: Secondary | ICD-10-CM

## 2022-11-08 NOTE — Telephone Encounter (Signed)
Patient's husband called. LM on the PT VM. She would like a referral for physical therapy sent to the [facility]. The call back number is [phone]

## 2022-11-09 NOTE — Telephone Encounter (Signed)
[place] for referral? Were you ordering outpatient physical therapy? Note states weightbearing as tolerated and ankle ROM. Thanks.

## 2022-11-09 NOTE — Telephone Encounter (Signed)
I called patient's husband and advised referral entered. He asked about Reed Breech brace that a friend has and thought it would be easier for patient. Sounds like ASO. I explained that she is just beginning WBAT and that she would need to WB in the boot. Explained that sometimes she will progress to ASO. He will call if they do not hear from PT or if there are any other continued questions.

## 2022-11-15 ENCOUNTER — Telehealth: Payer: Self-pay | Admitting: Orthopaedic Surgery

## 2022-11-15 NOTE — Telephone Encounter (Signed)
Patients states she still has not heard from P/T people. Please advise

## 2022-11-15 NOTE — Telephone Encounter (Signed)
I called patient and advised that, per referral note, PT has tried to reach her and left voicemail. Gave patient number to call and schedule.

## 2022-11-18 ENCOUNTER — Other Ambulatory Visit: Payer: Self-pay

## 2022-11-18 ENCOUNTER — Ambulatory Visit: Payer: Medicare HMO | Attending: Orthopaedic Surgery

## 2022-11-18 DIAGNOSIS — S82842A Displaced bimalleolar fracture of left lower leg, initial encounter for closed fracture: Secondary | ICD-10-CM | POA: Insufficient documentation

## 2022-11-18 DIAGNOSIS — R262 Difficulty in walking, not elsewhere classified: Secondary | ICD-10-CM

## 2022-11-18 DIAGNOSIS — M25672 Stiffness of left ankle, not elsewhere classified: Secondary | ICD-10-CM

## 2022-11-18 DIAGNOSIS — M25572 Pain in left ankle and joints of left foot: Secondary | ICD-10-CM

## 2022-11-18 DIAGNOSIS — M6281 Muscle weakness (generalized): Secondary | ICD-10-CM

## 2022-11-18 DIAGNOSIS — R252 Cramp and spasm: Secondary | ICD-10-CM

## 2022-11-18 NOTE — Therapy (Signed)
OUTPATIENT PHYSICAL THERAPY LOWER EXTREMITY EVALUATION   Patient Name: RIO TABER MRN: [id number] DOB:[birth year], 77 y.o., female Today's Date: 11/18/2022  END OF SESSION:  PT End of Session - 11/18/22 1404     Visit Number 1    Date for PT Re-Evaluation 01/13/23    Authorization Type [employer]    Progress Note Due on Visit 10    PT Start Time 1400    PT Stop Time 1510    PT Time Calculation (min) 70 min    Activity Tolerance Patient tolerated treatment well    Behavior During Therapy WFL for tasks assessed/performed             Past Medical History:  Diagnosis Date   GERD (gastroesophageal reflux disease)    TAKES PRILOSEC   Headache(784.0)    SINCE NECK  THING   High cholesterol    TAKES LIPITOR   Hypertension    PONV (postoperative nausea and vomiting)    NOTHING IN THE LAST COUPLE YRS   Rotator cuff tear, right    JUST FOUND OUT THIS WEEK   Past Surgical History:  Procedure Laterality Date   ABDOMINAL HYSTERECTOMY     ANTERIOR CERVICAL DECOMP/DISCECTOMY FUSION  10/01/2011   Procedure: ANTERIOR CERVICAL DECOMPRESSION/DISCECTOMY FUSION 2 LEVELS;  Surgeon: Marybelle Killings;  Location: [facility];  Service: Orthopedics;  Laterality: N/A;  C5-6, C6-7 Anterior Cervical Discectomy and Fusion, Allograft and Plate   BLEPHAROPLASTY     2001   BREAST BIOPSY Left 11/20/2019   BREAST EXCISIONAL BIOPSY Right    BREAST SURGERY     10 YRS  BENIGN   FRACTURE SURGERY     LEFT  WRIST   LEFT HEART CATH AND CORONARY ANGIOGRAPHY N/A 07/15/2020   Procedure: LEFT HEART CATH AND CORONARY ANGIOGRAPHY;  Surgeon: Nigel Mormon, MD;  Location: [facility];  Service: Cardiovascular;  Laterality: N/A;   LEFT WRIST     METAL PLATE    SHOULDER SURGERY Right    arthroscopy and rotator cuff repair   TUBAL LIGATION     Patient Active Problem List   Diagnosis Date Noted   Bimalleolar ankle fracture, left, closed, initial encounter 09/08/2022   Exertional chest pain  07/14/2020   Abnormal stress test 07/14/2020   Trochanteric bursitis, left hip 09/18/2019   Impingement syndrome of left shoulder 06/06/2019   Cervical spondylosis 10/02/2011   ARTHRITIS 02/27/2008   GASTRITIS 01/16/2008   ABDOMINAL PAIN, EPIGASTRIC 01/15/2008    PCP: Heywood Bene, PA-C   REFERRING PROVIDER: Marybelle Killings, MD  REFERRING DIAG: [phone] (ICD-10-CM) - Bimalleolar ankle fracture, left, closed, initial encounter  THERAPY DIAG:  Stiffness of left ankle, not elsewhere classified  Pain in left ankle and joints of left foot  Difficulty in walking, not elsewhere classified  Muscle weakness (generalized)  Cramp and spasm  Rationale for Evaluation and Treatment: Rehabilitation  ONSET DATE: 11/09/2022  SUBJECTIVE:   SUBJECTIVE STATEMENT: Patient reports she fell down some steps and fractured her ankle.  She underwent ORIF on 09-13-22  PERTINENT HISTORY: Recent PCP note on 10-21-22 Dennice is a pleasant 77 year old woman who is 5 weeks status post open reduction internal fixation of left bimalleolar ankle fracture with Dr. Lorin Mercy. She is due to see him in the next week or so. She comes in today because she feels like she has had some increased swelling in the ankle. She also is describing neuropathic type pain like something is scrubbing against her leg.  She denies any particular particular calf pain she denies any fever or chills. She takes very little pain medication and has a family history of addiction so is very conscientious of this  Recent f/u note with Dr. Lorin Mercy on 11-02-22 Assessment & Plan: Follow-up bimalleolar ankle fracture dislocation. X-rays today demonstrates progressive healing both sides. Incisions look good she needs more soap and water as well as lotion. Begin therapy weightbearing as tolerated.  PAIN:  Are you having pain?  Yes 2/10   PRECAUTIONS: Fall  WEIGHT BEARING RESTRICTIONS: No  FALLS:  Has patient fallen in last 6 months? Yes.  Number of falls 1 this incident , down the steps  LIVING ENVIRONMENT: Lives with: lives with their spouse Lives in: House/apartment Stairs: Yes: Internal: 12 steps; on left going up and External: 4 steps; none Has following equipment at home: Environmental consultant - 4 wheeled and Wheelchair (manual)  OCCUPATION: works from home  PLOF: Independent, Independent with basic ADLs, Independent with household mobility without device, Independent with community mobility without device, Independent with homemaking with ambulation, Independent with gait, and Independent with transfers  PATIENT GOALS: I'd like to walk unassisted without any a.d. or boot.    NEXT MD VISIT: 12/01/22  OBJECTIVE:   DIAGNOSTIC FINDINGS: bi-malleolar fracture with fixation  PATIENT SURVEYS:  FOTO 43 predicted 38  COGNITION: Overall cognitive status: Within functional limits for tasks assessed     SENSATION: WFL   LOWER EXTREMITY ROM:  Active ROM Right eval Left eval  Ankle dorsiflexion  22  Ankle plantarflexion  2  Ankle inversion  22  Ankle eversion  -4   (Blank rows = not tested)  LOWER EXTREMITY MMT:  Deferred due to acuteness  FUNCTIONAL TESTS:  5 times sit to stand: 12.55 sec Timed up and go (TUG): 34.00 sec  GAIT: Distance walked: 50 feet  Assistive device utilized: Walker - 2 wheeled Level of assistance: SBA Comments: Needs vc's for step through gait and avoiding scissoring, vc's to increase step width   TODAY'S TREATMENT:                                                                                                                              DATE: 11/18/22 Initial eval completed and initiated HEP    PATIENT EDUCATION:  Education details: Initiated HEP Person educated: Patient Education method: Consulting civil engineer, Media planner, Verbal cues, and Handouts Education comprehension: verbalized understanding, returned demonstration, and verbal cues required  HOME EXERCISE PROGRAM: Access Code:  [email or web address] URL: [email or web address] Date: 11/18/2022 Prepared by: Candyce Churn  Exercises - Seated Ankle Dorsiflexion Stretch  - 1 x daily - 7 x weekly - 1 sets - 10 reps - 5 sec hold - Seated Toe Raise  - 1 x daily - 7 x weekly - 1 sets - 20 reps - Seated Heel Raise  - 1 x daily - 7 x weekly - 1 sets - 20 reps - Seated Ankle Alphabet  - 1 x daily -  7 x weekly - 1 sets - 1-2 reps - a-z hold - Seated Ankle Pumps  - 1 x daily - 7 x weekly - 1 sets - 20-30 reps  ASSESSMENT:  CLINICAL IMPRESSION: Patient is a 77 y.o. female who was seen today for physical therapy evaluation and treatment for post left ankle fracture with fixation.  She is 9 weeks post op and comes into therapy in wheelchair.  Spouse is attending and is eager for her to get back on her feet.  She lives in a 2 story home and has 4 steps to enter the home with no handrail.   She has been going up the steps on her bottom since she does not have handrail.  She states her pain is minimal.  Prior to her injury, she was fully independent and driving and did all ADL's and IADL's with ease.  She presents with decreased ROM , strength and function along with elevated pain.  She should respond well to post ORIF protocol.    OBJECTIVE IMPAIRMENTS: Abnormal gait, decreased balance, decreased mobility, difficulty walking, decreased ROM, decreased strength, increased edema, increased fascial restrictions, increased muscle spasms, impaired flexibility, and pain.   ACTIVITY LIMITATIONS: carrying, lifting, bending, standing, squatting, sleeping, stairs, transfers, bathing, toileting, and dressing  PARTICIPATION LIMITATIONS: meal prep, cleaning, laundry, driving, shopping, community activity, yard work, and church  PERSONAL FACTORS: Age, Fitness, and 1-2 comorbidities: Htn, Right shoulder and left wrist past injuries  are also affecting patient's functional outcome.   REHAB POTENTIAL: Good  CLINICAL DECISION MAKING:  Stable/uncomplicated  EVALUATION COMPLEXITY: Low   GOALS: Goals reviewed with patient? Yes  SHORT TERM GOALS: Target date: 12/16/2022   Pain report to be no greater than 4/10  Baseline: Goal status: INITIAL  2.  Patient will be independent with initial HEP  Baseline:  Goal status: INITIAL  3.  Patient to ambulate vs using wheelchair at all times Baseline: WC majority of the time Goal status: INITIAL   LONG TERM GOALS: Target date: 01/13/2023   Pain report to be no greater than 2/10  Baseline:  Goal status: INITIAL  2.  Patient will be independent with advanced HEP  Baseline:  Goal status: INITIAL  3.  FOTO to improve to predicted outcome of 66 Baseline:  Goal status: INITIAL  4.  TUG and 5 times sit to stand to improve by 2-3 seconds Baseline:  Goal status: INITIAL  5.  Patient to report 85% improvement in overall symptoms  Baseline:  Goal status: INITIAL  6.  Patient to demonstrate normal heel to toe progression safely without a.d.  Baseline:  Goal status: INITIAL   PLAN:  PT FREQUENCY: 1-2x/week  PT DURATION: 8 weeks  PLANNED INTERVENTIONS: Therapeutic exercises, Therapeutic activity, Neuromuscular re-education, Balance training, Gait training, Patient/Family education, Self Care, Joint mobilization, Stair training, Orthotic/Fit training, DME instructions, Aquatic Therapy, Dry Needling, Electrical stimulation, Cryotherapy, Moist heat, Compression bandaging, scar mobilization, Splintting, Taping, Vasopneumatic device, Ionotophoresis 4mg /ml Dexamethasone, Manual therapy, and Re-evaluation  PLAN FOR NEXT SESSION: Review HEP, Nustep, work on progressive weight bearing tolerance and normalizing gait.     Anderson Malta B. Kaija Kovacevic, PT 11/18/22 3:37 PM  [facility] [address] [place], [place] [address] Phone # [phone] Fax [phone]

## 2022-11-23 ENCOUNTER — Telehealth: Payer: Medicare HMO | Admitting: Physician Assistant

## 2022-11-23 DIAGNOSIS — R3989 Other symptoms and signs involving the genitourinary system: Secondary | ICD-10-CM

## 2022-11-24 NOTE — Progress Notes (Signed)
Because of multiple drug allergies and risk of more complicated UTI if not properly treated, I feel your condition warrants further evaluation and I recommend that you be seen for a face to face visit to get a urine culture so we can see what antibiotics you can take will be most effective. This has to be done before starting an antibiotic to be accurate. Please contact your primary care physician practice to be seen. Many offices offer virtual options to be seen via video if you are not comfortable going in person to a medical facility at this time.  NOTE: You will NOT be charged for this eVisit.  If you do not have a PCP, [facility] offers a free physician referral service available at [phone]. Our trained staff has the experience, knowledge and resources to put you in touch with a physician who is right for you.    If you are having a true medical emergency please call 911.   Your e-visit answers were reviewed by a board certified advanced clinical practitioner to complete your personal care plan.  Thank you for using e-Visits.

## 2022-11-29 ENCOUNTER — Other Ambulatory Visit: Payer: Self-pay

## 2022-11-29 ENCOUNTER — Encounter (HOSPITAL_BASED_OUTPATIENT_CLINIC_OR_DEPARTMENT_OTHER): Payer: Self-pay

## 2022-11-29 ENCOUNTER — Emergency Department (HOSPITAL_BASED_OUTPATIENT_CLINIC_OR_DEPARTMENT_OTHER): Payer: Medicare HMO

## 2022-11-29 ENCOUNTER — Emergency Department (HOSPITAL_BASED_OUTPATIENT_CLINIC_OR_DEPARTMENT_OTHER)
Admission: EM | Admit: 2022-11-29 | Discharge: 2022-11-29 | Disposition: A | Discharge status: Home / Self Care | Payer: Medicare HMO | Attending: Emergency Medicine | Admitting: Emergency Medicine

## 2022-11-29 DIAGNOSIS — N390 Urinary tract infection, site not specified: Secondary | ICD-10-CM

## 2022-11-29 DIAGNOSIS — R300 Dysuria: Secondary | ICD-10-CM | POA: Insufficient documentation

## 2022-11-29 DIAGNOSIS — R109 Unspecified abdominal pain: Secondary | ICD-10-CM | POA: Diagnosis not present

## 2022-11-29 DIAGNOSIS — I10 Essential (primary) hypertension: Secondary | ICD-10-CM | POA: Insufficient documentation

## 2022-11-29 DIAGNOSIS — R350 Frequency of micturition: Secondary | ICD-10-CM | POA: Insufficient documentation

## 2022-11-29 DIAGNOSIS — Z79899 Other long term (current) drug therapy: Secondary | ICD-10-CM | POA: Diagnosis not present

## 2022-11-29 LAB — CBC WITH DIFFERENTIAL/PLATELET
Abs Immature Granulocytes: 0.01 10*3/uL (ref 0.00–0.07)
Basophils Absolute: 0 10*3/uL (ref 0.0–0.1)
Basophils Relative: 0 %
Eosinophils Absolute: 0 10*3/uL (ref 0.0–0.5)
Eosinophils Relative: 1 %
HCT: 42.5 % (ref 36.0–46.0)
Hemoglobin: 14.5 g/dL (ref 12.0–15.0)
Immature Granulocytes: 0 %
Lymphocytes Relative: 27 %
Lymphs Abs: 1.4 10*3/uL (ref 0.7–4.0)
MCH: 30.7 pg (ref 26.0–34.0)
MCHC: 34.1 g/dL (ref 30.0–36.0)
MCV: 90 fL (ref 80.0–100.0)
Monocytes Absolute: 0.4 10*3/uL (ref 0.1–1.0)
Monocytes Relative: 8 %
Neutro Abs: 3.4 10*3/uL (ref 1.7–7.7)
Neutrophils Relative %: 64 %
Platelets: 285 10*3/uL (ref 150–400)
RBC: 4.72 MIL/uL (ref 3.87–5.11)
RDW: 13.1 % (ref 11.5–15.5)
WBC: 5.3 10*3/uL (ref 4.0–10.5)
nRBC: 0 % (ref 0.0–0.2)

## 2022-11-29 LAB — BASIC METABOLIC PANEL
Anion gap: 14 (ref 5–15)
BUN: 6 mg/dL — ABNORMAL LOW (ref 8–23)
CO2: 22 mmol/L (ref 22–32)
Calcium: 10.1 mg/dL (ref 8.9–10.3)
Chloride: 103 mmol/L (ref 98–111)
Creatinine, Ser: 1.02 mg/dL — ABNORMAL HIGH (ref 0.44–1.00)
GFR, Estimated: 57 mL/min — ABNORMAL LOW (ref >60)
Glucose, Bld: 105 mg/dL — ABNORMAL HIGH (ref 70–99)
Potassium: 3.5 mmol/L (ref 3.5–5.1)
Sodium: 139 mmol/L (ref 135–145)

## 2022-11-29 LAB — URINALYSIS, ROUTINE W REFLEX MICROSCOPIC
Bacteria, UA: NONE SEEN
Bilirubin Urine: NEGATIVE
Glucose, UA: NEGATIVE mg/dL
Hgb urine dipstick: NEGATIVE
Nitrite: NEGATIVE
Protein, ur: NEGATIVE mg/dL
Specific Gravity, Urine: 1.005 (ref 1.005–1.030)
pH: 5.5 (ref 5.0–8.0)

## 2022-11-29 MED ORDER — MORPHINE SULFATE (PF) 4 MG/ML IV SOLN
4.0000 mg | Freq: Once | INTRAVENOUS | Status: AC
  Administered 2022-11-29: 4 mg via INTRAVENOUS
  Filled 2022-11-29: qty 1

## 2022-11-29 MED ORDER — CYCLOBENZAPRINE HCL 10 MG PO TABS: 10.0000 mg | ORAL_TABLET | Freq: Three times a day (TID) | ORAL | 0 refills | Status: DC | PRN

## 2022-11-29 MED ORDER — ONDANSETRON HCL 4 MG/2ML IJ SOLN
4.0000 mg | Freq: Once | INTRAMUSCULAR | Status: AC
  Administered 2022-11-29: 4 mg via INTRAVENOUS
  Filled 2022-11-29: qty 2

## 2022-11-29 MED ORDER — SODIUM CHLORIDE 0.9 % IV BOLUS
500.0000 mL | Freq: Once | INTRAVENOUS | Status: AC
  Administered 2022-11-29: 500 mL via INTRAVENOUS

## 2022-11-29 MED ORDER — CIPROFLOXACIN HCL 500 MG PO TABS: 500.0000 mg | ORAL_TABLET | Freq: Two times a day (BID) | ORAL | 0 refills | Status: DC

## 2022-11-29 NOTE — ED Notes (Signed)
Patient to CT.

## 2022-11-29 NOTE — ED Provider Notes (Signed)
[facility] Provider Note   CSN: [id number] Arrival date & time: 11/29/22  [id number]     History  Chief Complaint  Patient presents with   Flank Pain    Jeanne Schwartz is a 77 y.o. female.  Patient is a 77 year old female with past medical history of hypertension, hyperlipidemia, and recent ankle injury requiring surgery.  Patient presenting today with complaints of left flank/back pain.  She also describes urinary frequency and dysuria.  She was diagnosed with a UTI approximately 1 week ago and has been taking Macrobid since.  She states that her symptoms are worsening and is now experiencing pain in her left flank.  She also reports a fall approximately 2-1/2 to 3 weeks ago, but denies that she was injured at that time.  The history is provided by the patient.       Home Medications Prior to Admission medications   Medication Sig Start Date End Date Taking? Authorizing Provider  alendronate (FOSAMAX) 70 MG tablet Take 70 mg by mouth once a week. 03/11/21   [provider]  atorvastatin (LIPITOR) 20 MG tablet Take 20 mg by mouth at bedtime.     [provider]  Calcium Carb-Cholecalciferol (CALCIUM 600-D PO) Take 650 mg by mouth daily.    [provider]  CRANBERRY PO Take 1 tablet by mouth daily at 12 noon.    [provider]  Cyanocobalamin (B-12 PO) Take 1 tablet by mouth daily.    [provider]  cycloSPORINE (RESTASIS) 0.05 % ophthalmic emulsion Place 1 drop into both eyes daily as needed (dry eyes).  09/27/19   [provider]  Flaxseed, Linseed, (FLAX SEEDS PO) Take by mouth.    [provider]  gabapentin (NEURONTIN) 100 MG capsule Take 1 capsule (100 mg total) by mouth 3 (three) times daily. 10/21/22   Persons, Bevely Palmer, PA  lisinopril (ZESTRIL) 20 MG tablet Take 20 mg by mouth daily. 06/30/22   [provider]  Multiple Vitamin (MULTIVITAMIN) capsule Take 1  capsule by mouth daily.     [provider]  omeprazole (PRILOSEC) 20 MG capsule Take 20 mg by mouth daily.    [provider]  oxyCODONE-acetaminophen (PERCOCET/ROXICET) 5-325 MG tablet Take 1-2 tablets by mouth every 6 (six) hours as needed for severe pain. Patient not taking: Reported on 10/21/2022 09/28/22   Marybelle Killings, MD  vitamin C (ASCORBIC ACID) 250 MG tablet Take 250 mg by mouth daily.    [provider]      Allergies    Meclizine, Penicillins, Prochlorperazine edisylate, and Sulfonamide derivatives    Review of Systems   Review of Systems  All other systems reviewed and are negative.   Physical Exam Updated Vital Signs BP (!) 143/69   Pulse 95   Temp (!) 97.4 F (36.3 C) (Oral)   Resp 20   Ht 5' 6"$  (1.676 m)   Wt 68 kg   SpO2 100%   BMI 24.20 kg/m  Physical Exam Vitals and nursing note reviewed.  Constitutional:      General: She is not in acute distress.    Appearance: She is well-developed. She is not diaphoretic.  HENT:     Head: Normocephalic and atraumatic.  Cardiovascular:     Rate and Rhythm: Normal rate and regular rhythm.     Heart sounds: No murmur heard.    No friction rub. No gallop.  Pulmonary:     Effort: Pulmonary  effort is normal. No respiratory distress.     Breath sounds: Normal breath sounds. No wheezing.  Abdominal:     General: Bowel sounds are normal. There is no distension.     Palpations: Abdomen is soft.     Tenderness: There is no abdominal tenderness. There is left CVA tenderness. There is no right CVA tenderness, guarding or rebound.  Musculoskeletal:        General: Normal range of motion.     Cervical back: Normal range of motion and neck supple.  Skin:    General: Skin is warm and dry.  Neurological:     General: No focal deficit present.     Mental Status: She is alert and oriented to person, place, and time.     ED Results / Procedures / Treatments   Labs (all labs ordered are listed,  but only abnormal results are displayed) Labs Reviewed - No data to display  EKG None  Radiology No results found.  Procedures Procedures    Medications Ordered in ED Medications  sodium chloride 0.9 % bolus 500 mL (has no administration in time range)    ED Course/ Medical Decision Making/ A&P  Patient is a 77 year old female presenting with complaints of left flank pain and dysuria/frequency.  She is finishing up a round of Macrobid which was prescribed for a UTI.  Patient arrives here with stable vital signs and physical examination reveals some CVA tenderness, but is otherwise unremarkable.  Workup initiated including CBC, metabolic panel, and urinalysis.  Patient has no elevation in white count and metabolic panel basically unremarkable.  Urinalysis shows trace leukocytes, but is otherwise clear.  CT renal obtained showing no acute intra-abdominal pathology or obstructive uropathy.  Patient given IV fluids and morphine for pain and Zofran for nausea and seems to be feeling somewhat better.  The cause of her pain is unclear, but may be urinary related.  She does have trace leukocytes and is finishing up Macrobid.  I have decided to prescribe a stronger antibiotic and Keflex.  She will also be prescribed Flexeril and see if this helps.  She is to follow-up with primary doctor if not improving.  Nothing appears emergent.  Final Clinical Impression(s) / ED Diagnoses Final diagnoses:  None    Rx / DC Orders ED Discharge Orders     None         Veryl Speak, MD 11/29/22 [phone]

## 2022-11-29 NOTE — ED Triage Notes (Signed)
Patient here POV from Home.  Endorses Neck and Left Flank Pain that began yesterday.   Also notes Dysuria and Frequency that began 1 Week ago. Began taking Antibiotic 6 Days ago. Dysuria has worsened since.   Fall 2-3 Weeks ago. [place] backwards in her garage due to [place].  No Known Fevers. Mild Nausea. No Emesis. No Diarrhea. No Hematuria.   NAD Noted during Triage. A&Ox4. GCS 15. BIB Wheelchair.

## 2022-11-29 NOTE — Discharge Instructions (Signed)
Begin taking Cipro as prescribed.  Begin taking Flexeril as prescribed as needed for pain.  Follow-up with your primary doctor if symptoms or not improving in the next few days, and return to the ER if symptoms significantly worsen or change.

## 2022-11-30 ENCOUNTER — Encounter: Payer: Self-pay | Admitting: Orthopaedic Surgery

## 2022-11-30 ENCOUNTER — Ambulatory Visit (INDEPENDENT_AMBULATORY_CARE_PROVIDER_SITE_OTHER): Payer: Medicare HMO

## 2022-11-30 ENCOUNTER — Ambulatory Visit (INDEPENDENT_AMBULATORY_CARE_PROVIDER_SITE_OTHER): Payer: Medicare HMO | Admitting: Orthopaedic Surgery

## 2022-11-30 VITALS — BP 147/88 | HR 105 | Ht 66.0 in | Wt 149.0 lb

## 2022-11-30 DIAGNOSIS — S82842A Displaced bimalleolar fracture of left lower leg, initial encounter for closed fracture: Secondary | ICD-10-CM

## 2022-11-30 NOTE — Progress Notes (Signed)
Post-Op Visit Note   Patient: Jeanne Schwartz           Date of Birth: 11/26/1945           MRN: [id number] Visit Date: 11/30/2022 PCP: Heywood Bene, PA-C   Assessment & Plan: Follow-up bimalleolar ankle fracture dislocation.  Skin looks good she is now using a rolling walker with reverse seat weightbearing as tolerated without pain.  She can discontinue boot use her tennis shoe work on ankle range of motion if she has some increased pain she will put her boot back on.  Recheck 6 weeks no x-ray needed on return.  Chief Complaint:  Chief Complaint  Patient presents with   Left Ankle - Routine Post Op, Follow-up    09/13/2022 ORIF left ankle fx   Visit Diagnoses:  1. Bimalleolar ankle fracture, left, closed, initial encounter     Plan: Return in 6 weeks no x-ray needed on return.  Follow-Up Instructions: Return in about 1 month (around 12/29/2022).   Orders:  Orders Placed This Encounter  Procedures   XR Ankle Complete Left   No orders of the defined types were placed in this encounter.   Imaging: No results found.  PMFS History: Patient Active Problem List   Diagnosis Date Noted   Bimalleolar ankle fracture, left, closed, initial encounter 09/08/2022   Exertional chest pain 07/14/2020   Abnormal stress test 07/14/2020   Trochanteric bursitis, left hip 09/18/2019   Impingement syndrome of left shoulder 06/06/2019   Cervical spondylosis 10/02/2011   ARTHRITIS 02/27/2008   GASTRITIS 01/16/2008   ABDOMINAL PAIN, EPIGASTRIC 01/15/2008   Past Medical History:  Diagnosis Date   GERD (gastroesophageal reflux disease)    TAKES PRILOSEC   Headache(784.0)    SINCE NECK  THING   High cholesterol    TAKES LIPITOR   Hypertension    PONV (postoperative nausea and vomiting)    NOTHING IN THE LAST COUPLE YRS   Rotator cuff tear, right    JUST FOUND OUT THIS WEEK    Family History  Problem Relation Age of Onset   Heart attack Mother    Leukemia Father     Diabetes Maternal Grandmother    Breast cancer Cousin     Past Surgical History:  Procedure Laterality Date   ABDOMINAL HYSTERECTOMY     ANTERIOR CERVICAL DECOMP/DISCECTOMY FUSION  10/01/2011   Procedure: ANTERIOR CERVICAL DECOMPRESSION/DISCECTOMY FUSION 2 LEVELS;  Surgeon: Marybelle Killings;  Location: [facility];  Service: Orthopedics;  Laterality: N/A;  C5-6, C6-7 Anterior Cervical Discectomy and Fusion, Allograft and Plate   BLEPHAROPLASTY     2001   BREAST BIOPSY Left 11/20/2019   BREAST EXCISIONAL BIOPSY Right    BREAST SURGERY     10 YRS  BENIGN   FRACTURE SURGERY     LEFT  WRIST   LEFT HEART CATH AND CORONARY ANGIOGRAPHY N/A 07/15/2020   Procedure: LEFT HEART CATH AND CORONARY ANGIOGRAPHY;  Surgeon: Nigel Mormon, MD;  Location: [facility];  Service: Cardiovascular;  Laterality: N/A;   LEFT WRIST     METAL PLATE    SHOULDER SURGERY Right    arthroscopy and rotator cuff repair   TUBAL LIGATION     Social History   Occupational History   Not on file  Tobacco Use   Smoking status: Never   Smokeless tobacco: Never  Vaping Use   Vaping Use: Never used  Substance and Sexual Activity   Alcohol use: No  Drug use: No   Sexual activity: Not on file

## 2022-12-01 ENCOUNTER — Ambulatory Visit: Payer: Medicare HMO | Admitting: Physical Therapy

## 2022-12-01 NOTE — Therapy (Incomplete)
OUTPATIENT PHYSICAL THERAPY TREATMENT NOTE   Patient Name: Jeanne Schwartz MRN: [id number] DOB:09/13/1946, 77 y.o., female Today's Date: 12/01/2022  PCP: Marland Kitchen REFERRING PROVIDER: ***  END OF SESSION:      Past Medical History:  Diagnosis Date   GERD (gastroesophageal reflux disease)    TAKES PRILOSEC   Headache(784.0)    SINCE NECK  THING   High cholesterol    TAKES LIPITOR   Hypertension    PONV (postoperative nausea and vomiting)    NOTHING IN THE LAST COUPLE YRS   Rotator cuff tear, right    JUST FOUND OUT THIS WEEK   Past Surgical History:  Procedure Laterality Date   ABDOMINAL HYSTERECTOMY     ANTERIOR CERVICAL DECOMP/DISCECTOMY FUSION  10/01/2011   Procedure: ANTERIOR CERVICAL DECOMPRESSION/DISCECTOMY FUSION 2 LEVELS;  Surgeon: Marybelle Killings;  Location: [facility];  Service: Orthopedics;  Laterality: N/A;  C5-6, C6-7 Anterior Cervical Discectomy and Fusion, Allograft and Plate   BLEPHAROPLASTY     2001   BREAST BIOPSY Left 11/20/2019   BREAST EXCISIONAL BIOPSY Right    BREAST SURGERY     10 YRS  BENIGN   FRACTURE SURGERY     LEFT  WRIST   LEFT HEART CATH AND CORONARY ANGIOGRAPHY N/A 07/15/2020   Procedure: LEFT HEART CATH AND CORONARY ANGIOGRAPHY;  Surgeon: Nigel Mormon, MD;  Location: [facility];  Service: Cardiovascular;  Laterality: N/A;   LEFT WRIST     METAL PLATE    SHOULDER SURGERY Right    arthroscopy and rotator cuff repair   TUBAL LIGATION     Patient Active Problem List   Diagnosis Date Noted   Bimalleolar ankle fracture, left, closed, initial encounter 09/08/2022   Exertional chest pain 07/14/2020   Abnormal stress test 07/14/2020   Trochanteric bursitis, left hip 09/18/2019   Impingement syndrome of left shoulder 06/06/2019   Cervical spondylosis 10/02/2011   ARTHRITIS 02/27/2008   GASTRITIS 01/16/2008   ABDOMINAL PAIN, EPIGASTRIC 01/15/2008    PCP: Heywood Bene, PA-C   REFERRING PROVIDER: Marybelle Killings,  MD  REFERRING DIAG: [phone] (ICD-10-CM) - Bimalleolar ankle fracture, left, closed, initial encounter  THERAPY DIAG:  No diagnosis found.  Rationale for Evaluation and Treatment: Rehabilitation  ONSET DATE: 11/09/2022  SUBJECTIVE:   SUBJECTIVE STATEMENT: Patient reports she fell down some steps and fractured her ankle.  She underwent ORIF on 09-13-22  PERTINENT HISTORY: Recent PCP note on 10-21-22 Jeanne Schwartz is a pleasant 77 year old woman who is 5 weeks status post open reduction internal fixation of left bimalleolar ankle fracture with Dr. Lorin Mercy. She is due to see him in the next week or so. She comes in today because she feels like she has had some increased swelling in the ankle. She also is describing neuropathic type pain like something is scrubbing against her leg. She denies any particular particular calf pain she denies any fever or chills. She takes very little pain medication and has a family history of addiction so is very conscientious of this  Recent f/u note with Dr. Lorin Mercy on 11-02-22 Assessment & Plan: Follow-up bimalleolar ankle fracture dislocation. X-rays today demonstrates progressive healing both sides. Incisions look good she needs more soap and water as well as lotion. Begin therapy weightbearing as tolerated.  PAIN:  Are you having pain?  Yes 2/10   PRECAUTIONS: Fall  WEIGHT BEARING RESTRICTIONS: No  FALLS:  Has patient fallen in last 6 months? Yes. Number of falls 1 this incident ,  down the steps  LIVING ENVIRONMENT: Lives with: lives with their spouse Lives in: House/apartment Stairs: Yes: Internal: 12 steps; on left going up and External: 4 steps; none Has following equipment at home: Environmental consultant - 4 wheeled and Wheelchair (manual)  OCCUPATION: works from home  PLOF: Independent, Independent with basic ADLs, Independent with household mobility without device, Independent with community mobility without device, Independent with homemaking with ambulation,  Independent with gait, and Independent with transfers  PATIENT GOALS: I'd like to walk unassisted without any a.d. or boot.    NEXT MD VISIT: 12/01/22  OBJECTIVE:   DIAGNOSTIC FINDINGS: bi-malleolar fracture with fixation  PATIENT SURVEYS:  FOTO 43 predicted 41  COGNITION: Overall cognitive status: Within functional limits for tasks assessed     SENSATION: WFL   LOWER EXTREMITY ROM:  Active ROM Right eval Left eval  Ankle dorsiflexion  22  Ankle plantarflexion  2  Ankle inversion  22  Ankle eversion  -4   (Blank rows = not tested)  LOWER EXTREMITY MMT:  Deferred due to acuteness  FUNCTIONAL TESTS:  5 times sit to stand: 12.55 sec Timed up and go (TUG): 34.00 sec  GAIT: Distance walked: 50 feet  Assistive device utilized: Walker - 2 wheeled Level of assistance: SBA Comments: Needs vc's for step through gait and avoiding scissoring, vc's to increase step width   TODAY'S TREATMENT:                                                                                                                              DATE: 11/18/22 Initial eval completed and initiated HEP    PATIENT EDUCATION:  Education details: Initiated HEP Person educated: Patient Education method: Consulting civil engineer, Media planner, Verbal cues, and Handouts Education comprehension: verbalized understanding, returned demonstration, and verbal cues required  HOME EXERCISE PROGRAM: Access Code: [email or web address] URL: [email or web address] Date: 11/18/2022 Prepared by: Candyce Churn  Exercises - Seated Ankle Dorsiflexion Stretch  - 1 x daily - 7 x weekly - 1 sets - 10 reps - 5 sec hold - Seated Toe Raise  - 1 x daily - 7 x weekly - 1 sets - 20 reps - Seated Heel Raise  - 1 x daily - 7 x weekly - 1 sets - 20 reps - Seated Ankle Alphabet  - 1 x daily - 7 x weekly - 1 sets - 1-2 reps - a-z hold - Seated Ankle Pumps  - 1 x daily - 7 x weekly - 1 sets - 20-30 reps  ASSESSMENT:  CLINICAL  IMPRESSION: Patient is a 77 y.o. female who was seen today for physical therapy evaluation and treatment for post left ankle fracture with fixation.  She is 9 weeks post op and comes into therapy in wheelchair.  Spouse is attending and is eager for her to get back on her feet.  She lives in a 2 story home and has 4 steps to enter the home with no handrail.   She has  been going up the steps on her bottom since she does not have handrail.  She states her pain is minimal.  Prior to her injury, she was fully independent and driving and did all ADL's and IADL's with ease.  She presents with decreased ROM , strength and function along with elevated pain.  She should respond well to post ORIF protocol.    OBJECTIVE IMPAIRMENTS: Abnormal gait, decreased balance, decreased mobility, difficulty walking, decreased ROM, decreased strength, increased edema, increased fascial restrictions, increased muscle spasms, impaired flexibility, and pain.   ACTIVITY LIMITATIONS: carrying, lifting, bending, standing, squatting, sleeping, stairs, transfers, bathing, toileting, and dressing  PARTICIPATION LIMITATIONS: meal prep, cleaning, laundry, driving, shopping, community activity, yard work, and church  PERSONAL FACTORS: Age, Fitness, and 1-2 comorbidities: Htn, Right shoulder and left wrist past injuries  are also affecting patient's functional outcome.   REHAB POTENTIAL: Good  CLINICAL DECISION MAKING: Stable/uncomplicated  EVALUATION COMPLEXITY: Low   GOALS: Goals reviewed with patient? Yes  SHORT TERM GOALS: Target date: 12/16/2022   Pain report to be no greater than 4/10  Baseline: Goal status: INITIAL  2.  Patient will be independent with initial HEP  Baseline:  Goal status: INITIAL  3.  Patient to ambulate vs using wheelchair at all times Baseline: WC majority of the time Goal status: INITIAL   LONG TERM GOALS: Target date: 01/13/2023   Pain report to be no greater than 2/10  Baseline:  Goal  status: INITIAL  2.  Patient will be independent with advanced HEP  Baseline:  Goal status: INITIAL  3.  FOTO to improve to predicted outcome of 66 Baseline:  Goal status: INITIAL  4.  TUG and 5 times sit to stand to improve by 2-3 seconds Baseline:  Goal status: INITIAL  5.  Patient to report 85% improvement in overall symptoms  Baseline:  Goal status: INITIAL  6.  Patient to demonstrate normal heel to toe progression safely without a.d.  Baseline:  Goal status: INITIAL   PLAN:  PT FREQUENCY: 1-2x/week  PT DURATION: 8 weeks  PLANNED INTERVENTIONS: Therapeutic exercises, Therapeutic activity, Neuromuscular re-education, Balance training, Gait training, Patient/Family education, Self Care, Joint mobilization, Stair training, Orthotic/Fit training, DME instructions, Aquatic Therapy, Dry Needling, Electrical stimulation, Cryotherapy, Moist heat, Compression bandaging, scar mobilization, Splintting, Taping, Vasopneumatic device, Ionotophoresis 60m/ml Dexamethasone, Manual therapy, and Re-evaluation  PLAN FOR NEXT SESSION: Review HEP, Nustep, work on progressive weight bearing tolerance and normalizing gait.

## 2022-12-04 ENCOUNTER — Encounter: Payer: Self-pay | Admitting: Cardiology

## 2022-12-06 ENCOUNTER — Telehealth: Payer: Self-pay

## 2022-12-06 ENCOUNTER — Telehealth: Payer: Self-pay | Admitting: Orthopaedic Surgery

## 2022-12-06 DIAGNOSIS — M7989 Other specified soft tissue disorders: Secondary | ICD-10-CM

## 2022-12-06 NOTE — Telephone Encounter (Signed)
Duplicate message in chart.

## 2022-12-06 NOTE — Telephone Encounter (Signed)
Call back at [phone]

## 2022-12-06 NOTE — Telephone Encounter (Signed)
From patient

## 2022-12-06 NOTE — Telephone Encounter (Signed)
Patient called triage seeing if she can get a call back. She had surg on her ankle n Dec 4th.  She is stating that when she puts her ankle down it is extremely red swollen.  She would like a call back to discuss what she needs to do she has PT tomorrow and at this time is going to be unable to attend.   She hasn't had a new injury. This started  3-4 days ago.

## 2022-12-06 NOTE — Telephone Encounter (Signed)
I called and spoke with patient. She states that, for the past 3 days or so, whenever she puts her foot down, the foot and ankle swell and turn red and purple. She is getting creases in her ankle where she has swelling. She denies calf pain or swelling. She does not have history of blood clot. She has no new injury. She is concerned that she should not go to PT tomorrow.  Please advise.

## 2022-12-06 NOTE — Telephone Encounter (Signed)
Patient c/o pain and swelling on surgery ankle. Please advise . Sending call to Triage.

## 2022-12-06 NOTE — Telephone Encounter (Signed)
Referral entered. I called Gabriel Cirri to advise and see if she can get scheduled for tomorrow.

## 2022-12-07 ENCOUNTER — Ambulatory Visit: Payer: Medicare HMO

## 2022-12-07 DIAGNOSIS — M25572 Pain in left ankle and joints of left foot: Secondary | ICD-10-CM

## 2022-12-07 DIAGNOSIS — M6281 Muscle weakness (generalized): Secondary | ICD-10-CM

## 2022-12-07 DIAGNOSIS — R262 Difficulty in walking, not elsewhere classified: Secondary | ICD-10-CM

## 2022-12-07 DIAGNOSIS — M25672 Stiffness of left ankle, not elsewhere classified: Secondary | ICD-10-CM | POA: Diagnosis not present

## 2022-12-07 DIAGNOSIS — R252 Cramp and spasm: Secondary | ICD-10-CM

## 2022-12-07 NOTE — Therapy (Signed)
OUTPATIENT PHYSICAL THERAPY LOWER EXTREMITY TREATMENT NOTE   Patient Name: Jeanne Schwartz MRN: [id number] DOB:[birth year], 77 y.o., female Today's Date: 12/07/2022  END OF SESSION:  PT End of Session - 12/07/22 1228     Visit Number 2    Date for PT Re-Evaluation 01/13/23    Authorization Type [employer]    Authorization Time Period Cohere Approved 16 visits, 11/18/2022-01/13/2023-auth#[id number]    Progress Note Due on Visit 10    PT Start Time 1229    PT Stop Time 1323    PT Time Calculation (min) 54 min    Activity Tolerance Patient tolerated treatment well    Behavior During Therapy WFL for tasks assessed/performed             Past Medical History:  Diagnosis Date   GERD (gastroesophageal reflux disease)    TAKES PRILOSEC   Headache(784.0)    SINCE NECK  THING   High cholesterol    TAKES LIPITOR   Hypertension    PONV (postoperative nausea and vomiting)    NOTHING IN THE LAST COUPLE YRS   Rotator cuff tear, right    JUST FOUND OUT THIS WEEK   Past Surgical History:  Procedure Laterality Date   ABDOMINAL HYSTERECTOMY     ANTERIOR CERVICAL DECOMP/DISCECTOMY FUSION  10/01/2011   Procedure: ANTERIOR CERVICAL DECOMPRESSION/DISCECTOMY FUSION 2 LEVELS;  Surgeon: Marybelle Killings;  Location: [facility];  Service: Orthopedics;  Laterality: N/A;  C5-6, C6-7 Anterior Cervical Discectomy and Fusion, Allograft and Plate   BLEPHAROPLASTY     2001   BREAST BIOPSY Left 11/20/2019   BREAST EXCISIONAL BIOPSY Right    BREAST SURGERY     10 YRS  BENIGN   FRACTURE SURGERY     LEFT  WRIST   LEFT HEART CATH AND CORONARY ANGIOGRAPHY N/A 07/15/2020   Procedure: LEFT HEART CATH AND CORONARY ANGIOGRAPHY;  Surgeon: Nigel Mormon, MD;  Location: [facility];  Service: Cardiovascular;  Laterality: N/A;   LEFT WRIST     METAL PLATE    SHOULDER SURGERY Right    arthroscopy and rotator cuff repair   TUBAL LIGATION     Patient Active Problem List   Diagnosis Date Noted    Bimalleolar ankle fracture, left, closed, initial encounter 09/08/2022   Exertional chest pain 07/14/2020   Abnormal stress test 07/14/2020   Trochanteric bursitis, left hip 09/18/2019   Impingement syndrome of left shoulder 06/06/2019   Cervical spondylosis 10/02/2011   ARTHRITIS 02/27/2008   GASTRITIS 01/16/2008   ABDOMINAL PAIN, EPIGASTRIC 01/15/2008    PCP: Heywood Bene, PA-C   REFERRING PROVIDER: Marybelle Killings, MD  REFERRING DIAG: [phone] (ICD-10-CM) - Bimalleolar ankle fracture, left, closed, initial encounter  THERAPY DIAG:  Stiffness of left ankle, not elsewhere classified  Pain in left ankle and joints of left foot  Difficulty in walking, not elsewhere classified  Muscle weakness (generalized)  Cramp and spasm  Rationale for Evaluation and Treatment: Rehabilitation  ONSET DATE: 11/09/2022  SUBJECTIVE:   SUBJECTIVE STATEMENT: Patient reports she is doing good.  Got a new shoe and feels its too big but going to get a different one.  No pain just some swelling and discoloration.  Using rollator walker most of the time.  Only occasional sitting on the rollator to get from point A to point B.     PERTINENT HISTORY: Recent PCP note on 10-21-22 Jeanne Schwartz is a pleasant 77 year old woman who is 5 weeks status post open reduction internal  fixation of left bimalleolar ankle fracture with Dr. Lorin Mercy. She is due to see him in the next week or so. She comes in today because she feels like she has had some increased swelling in the ankle. She also is describing neuropathic type pain like something is scrubbing against her leg. She denies any particular particular calf pain she denies any fever or chills. She takes very little pain medication and has a family history of addiction so is very conscientious of this  Recent f/u note with Dr. Lorin Mercy on 11-02-22 Assessment & Plan: Follow-up bimalleolar ankle fracture dislocation. X-rays today demonstrates progressive healing both  sides. Incisions look good she needs more soap and water as well as lotion. Begin therapy weightbearing as tolerated.  PAIN:  Are you having pain?  Yes 2/10   PRECAUTIONS: Fall  WEIGHT BEARING RESTRICTIONS: No  FALLS:  Has patient fallen in last 6 months? Yes. Number of falls 1 this incident , down the steps  LIVING ENVIRONMENT: Lives with: lives with their spouse Lives in: House/apartment Stairs: Yes: Internal: 12 steps; on left going up and External: 4 steps; none Has following equipment at home: Environmental consultant - 4 wheeled and Wheelchair (manual)  OCCUPATION: works from home  PLOF: Independent, Independent with basic ADLs, Independent with household mobility without device, Independent with community mobility without device, Independent with homemaking with ambulation, Independent with gait, and Independent with transfers  PATIENT GOALS: I'd like to walk unassisted without any a.d. or boot.    NEXT MD VISIT: 12/01/22  OBJECTIVE:   DIAGNOSTIC FINDINGS: bi-malleolar fracture with fixation  PATIENT SURVEYS:  FOTO 43 predicted 40  COGNITION: Overall cognitive status: Within functional limits for tasks assessed     SENSATION: WFL   LOWER EXTREMITY ROM:  Active ROM Right eval Left eval  Ankle dorsiflexion  22  Ankle plantarflexion  2  Ankle inversion  22  Ankle eversion  -4   (Blank rows = not tested)  LOWER EXTREMITY MMT:  Deferred due to acuteness  FUNCTIONAL TESTS:  5 times sit to stand: 12.55 sec Timed up and go (TUG): 34.00 sec  GAIT: Distance walked: 50 feet  Assistive device utilized: Environmental consultant - 2 wheeled Level of assistance: SBA Comments: Needs vc's for step through gait and avoiding scissoring, vc's to increase step width   TODAY'S TREATMENT:                                                                                                                              DATE: 12/07/22 Seated heel slides with flexion overpressure at knee x 10 hold 5-10 sec  each Toe and heel raises x 20 PROM x 5 min to left ankle all planes of motion Supine 4 way tband : yellow with PT holding band x 10 each direction Attempted standing gastroc stretch but patient still uncomfortable with weight bearing Gait training x 100 feet x 2 with rollator walker, emphasis on heel to toe progression with  increasing WB as tolerated Weight shift in shoe fwd back in staggered stance  x 20, then side to side x 20 in // bars on balance pads Ice to left ankle in supine following treatment  x 8 min  DATE: 11/18/22 Initial eval completed and initiated HEP    PATIENT EDUCATION:  Education details: Initiated HEP Person educated: Patient Education method: Consulting civil engineer, Media planner, Verbal cues, and Handouts Education comprehension: verbalized understanding, returned demonstration, and verbal cues required  HOME EXERCISE PROGRAM: Access Code: [email or web address] URL: [email or web address] Date: 11/18/2022 Prepared by: Candyce Churn  Exercises - Seated Ankle Dorsiflexion Stretch  - 1 x daily - 7 x weekly - 1 sets - 10 reps - 5 sec hold - Seated Toe Raise  - 1 x daily - 7 x weekly - 1 sets - 20 reps - Seated Heel Raise  - 1 x daily - 7 x weekly - 1 sets - 20 reps - Seated Ankle Alphabet  - 1 x daily - 7 x weekly - 1 sets - 1-2 reps - a-z hold - Seated Ankle Pumps  - 1 x daily - 7 x weekly - 1 sets - 20-30 reps  ASSESSMENT:  CLINICAL IMPRESSION: Jemina saw orthopedist two days ago and he was contemplating about keeping her in the boot for a bit longer.  He eventually decided to allow wean from boot to shoe.  She has been in shoe since that visit but is still not quite sure about putting full weight on her left foot.  She continues to pick her foot up and pivot on her good foot when making turns.  She states she does feel pain at the medial side of the ankle at times and this is why she is hesitant about the weight bearing.   We encouraged her to continue with WBAT and what  this means.  She understands and will continue to increase weight bearing as she feels comfortable.  She responded very well to weight bearing tasks today and gait was nearly normalized with rollator walker by end of session.  She would benefit from continuing skilled PT for post ORIF protocol.    OBJECTIVE IMPAIRMENTS: Abnormal gait, decreased balance, decreased mobility, difficulty walking, decreased ROM, decreased strength, increased edema, increased fascial restrictions, increased muscle spasms, impaired flexibility, and pain.   ACTIVITY LIMITATIONS: carrying, lifting, bending, standing, squatting, sleeping, stairs, transfers, bathing, toileting, and dressing  PARTICIPATION LIMITATIONS: meal prep, cleaning, laundry, driving, shopping, community activity, yard work, and church  PERSONAL FACTORS: Age, Fitness, and 1-2 comorbidities: Htn, Right shoulder and left wrist past injuries  are also affecting patient's functional outcome.   REHAB POTENTIAL: Good  CLINICAL DECISION MAKING: Stable/uncomplicated  EVALUATION COMPLEXITY: Low   GOALS: Goals reviewed with patient? Yes  SHORT TERM GOALS: Target date: 12/16/2022   Pain report to be no greater than 4/10  Baseline: Goal status: INITIAL  2.  Patient will be independent with initial HEP  Baseline:  Goal status: INITIAL  3.  Patient to ambulate vs using wheelchair at all times Baseline: WC majority of the time Goal status: INITIAL   LONG TERM GOALS: Target date: 01/13/2023   Pain report to be no greater than 2/10  Baseline:  Goal status: INITIAL  2.  Patient will be independent with advanced HEP  Baseline:  Goal status: INITIAL  3.  FOTO to improve to predicted outcome of 66 Baseline:  Goal status: INITIAL  4.  TUG and 5 times sit to stand to improve by 2-3 seconds  Baseline:  Goal status: INITIAL  5.  Patient to report 85% improvement in overall symptoms  Baseline:  Goal status: INITIAL  6.  Patient to demonstrate  normal heel to toe progression safely without a.d.  Baseline:  Goal status: INITIAL   PLAN:  PT FREQUENCY: 1-2x/week  PT DURATION: 8 weeks  PLANNED INTERVENTIONS: Therapeutic exercises, Therapeutic activity, Neuromuscular re-education, Balance training, Gait training, Patient/Family education, Self Care, Joint mobilization, Stair training, Orthotic/Fit training, DME instructions, Aquatic Therapy, Dry Needling, Electrical stimulation, Cryotherapy, Moist heat, Compression bandaging, scar mobilization, Splintting, Taping, Vasopneumatic device, Ionotophoresis '4mg'$ /ml Dexamethasone, Manual therapy, and Re-evaluation  PLAN FOR NEXT SESSION: Progress HEP, Nustep, work on progressive weight bearing tolerance and normalizing gait.     Anderson Malta B. Takesha Steger, PT 12/07/22 1:29 PM  [facility] [address] [place], [place] [address] Phone # [phone] Fax [phone]

## 2022-12-09 ENCOUNTER — Ambulatory Visit: Payer: Medicare HMO | Admitting: Physical Therapy

## 2022-12-10 ENCOUNTER — Encounter (HOSPITAL_COMMUNITY): Payer: Self-pay

## 2022-12-10 ENCOUNTER — Ambulatory Visit (HOSPITAL_COMMUNITY)
Admission: RE | Admit: 2022-12-10 | Discharge: 2022-12-10 | Disposition: A | Discharge status: Home / Self Care | Payer: Medicare HMO | Source: Ambulatory Visit | Attending: Orthopaedic Surgery | Admitting: Orthopaedic Surgery

## 2022-12-10 ENCOUNTER — Telehealth: Payer: Self-pay

## 2022-12-10 DIAGNOSIS — M7989 Other specified soft tissue disorders: Secondary | ICD-10-CM

## 2022-12-10 NOTE — Telephone Encounter (Signed)
Please check on patient and order.

## 2022-12-10 NOTE — Telephone Encounter (Signed)
Dorothy from [facility] called triage. Patient is there now for an ultrasound, but has already had one done today at [facility]. She is letting patient. Just an [place]

## 2022-12-13 NOTE — Telephone Encounter (Signed)
As of Friday this was done at [facility]. Gwinda Passe is aware

## 2022-12-14 ENCOUNTER — Encounter: Payer: Self-pay | Admitting: Physical Therapy

## 2022-12-14 ENCOUNTER — Ambulatory Visit: Payer: Medicare HMO | Attending: Orthopaedic Surgery | Admitting: Physical Therapy

## 2022-12-14 DIAGNOSIS — M25672 Stiffness of left ankle, not elsewhere classified: Secondary | ICD-10-CM | POA: Diagnosis present

## 2022-12-14 DIAGNOSIS — M25572 Pain in left ankle and joints of left foot: Secondary | ICD-10-CM | POA: Insufficient documentation

## 2022-12-14 DIAGNOSIS — R252 Cramp and spasm: Secondary | ICD-10-CM | POA: Insufficient documentation

## 2022-12-14 DIAGNOSIS — M6281 Muscle weakness (generalized): Secondary | ICD-10-CM | POA: Diagnosis present

## 2022-12-14 DIAGNOSIS — R262 Difficulty in walking, not elsewhere classified: Secondary | ICD-10-CM | POA: Diagnosis present

## 2022-12-14 NOTE — Therapy (Signed)
OUTPATIENT PHYSICAL THERAPY LOWER EXTREMITY TREATMENT NOTE   Patient Name: Jeanne Schwartz MRN: [id number] DOB:07-01-1946, 77 y.o., female Today's Date: 12/14/2022  END OF SESSION:  PT End of Session - 12/14/22 1405     Visit Number 3    Date for PT Re-Evaluation 01/13/23    Authorization Type [employer]    Authorization Time Period Cohere Approved 16 visits, 11/18/2022-01/13/2023-auth#[id number]    Progress Note Due on Visit 10    PT Start Time 1400    PT Stop Time 1452    PT Time Calculation (min) 52 min    Activity Tolerance Patient tolerated treatment well;No increased pain    Behavior During Therapy WFL for tasks assessed/performed              Past Medical History:  Diagnosis Date   GERD (gastroesophageal reflux disease)    TAKES PRILOSEC   Headache(784.0)    SINCE NECK  THING   High cholesterol    TAKES LIPITOR   Hypertension    PONV (postoperative nausea and vomiting)    NOTHING IN THE LAST COUPLE YRS   Rotator cuff tear, right    JUST FOUND OUT THIS WEEK   Past Surgical History:  Procedure Laterality Date   ABDOMINAL HYSTERECTOMY     ANTERIOR CERVICAL DECOMP/DISCECTOMY FUSION  10/01/2011   Procedure: ANTERIOR CERVICAL DECOMPRESSION/DISCECTOMY FUSION 2 LEVELS;  Surgeon: Marybelle Killings;  Location: [facility];  Service: Orthopedics;  Laterality: N/A;  C5-6, C6-7 Anterior Cervical Discectomy and Fusion, Allograft and Plate   BLEPHAROPLASTY     2001   BREAST BIOPSY Left 11/20/2019   BREAST EXCISIONAL BIOPSY Right    BREAST SURGERY     10 YRS  BENIGN   FRACTURE SURGERY     LEFT  WRIST   LEFT HEART CATH AND CORONARY ANGIOGRAPHY N/A 07/15/2020   Procedure: LEFT HEART CATH AND CORONARY ANGIOGRAPHY;  Surgeon: Nigel Mormon, MD;  Location: [facility];  Service: Cardiovascular;  Laterality: N/A;   LEFT WRIST     METAL PLATE    SHOULDER SURGERY Right    arthroscopy and rotator cuff repair   TUBAL LIGATION     Patient Active Problem List   Diagnosis  Date Noted   Bimalleolar ankle fracture, left, closed, initial encounter 09/08/2022   Exertional chest pain 07/14/2020   Abnormal stress test 07/14/2020   Trochanteric bursitis, left hip 09/18/2019   Impingement syndrome of left shoulder 06/06/2019   Cervical spondylosis 10/02/2011   ARTHRITIS 02/27/2008   GASTRITIS 01/16/2008   ABDOMINAL PAIN, EPIGASTRIC 01/15/2008    PCP: Heywood Bene, PA-C   REFERRING PROVIDER: Marybelle Killings, MD  REFERRING DIAG: [phone] (ICD-10-CM) - Bimalleolar ankle fracture, left, closed, initial encounter  THERAPY DIAG:  Stiffness of left ankle, not elsewhere classified  Pain in left ankle and joints of left foot  Difficulty in walking, not elsewhere classified  Rationale for Evaluation and Treatment: Rehabilitation  ONSET DATE: 11/09/2022  SUBJECTIVE:   SUBJECTIVE STATEMENT: Pt states things are going well. Using the rollator carefully to make sure her gait is as "normal" as possible. No pain, but a lot of swelling.   PERTINENT HISTORY: Recent PCP note on 10-21-22 Jeanne Schwartz is a pleasant 77 year old woman who is 5 weeks status post open reduction internal fixation of left bimalleolar ankle fracture with Dr. Lorin Mercy. She is due to see him in the next week or so. She comes in today because she feels like she has had some increased  swelling in the ankle. She also is describing neuropathic type pain like something is scrubbing against her leg. She denies any particular particular calf pain she denies any fever or chills. She takes very little pain medication and has a family history of addiction so is very conscientious of this  Recent f/u note with Dr. Lorin Mercy on 11-02-22 Assessment & Plan: Follow-up bimalleolar ankle fracture dislocation. X-rays today demonstrates progressive healing both sides. Incisions look good she needs more soap and water as well as lotion. Begin therapy weightbearing as tolerated.  PAIN:  Are you having pain?  Yes 2/10    PRECAUTIONS: Fall  WEIGHT BEARING RESTRICTIONS: No  FALLS:  Has patient fallen in last 6 months? Yes. Number of falls 1 this incident , down the steps  LIVING ENVIRONMENT: Lives with: lives with their spouse Lives in: House/apartment Stairs: Yes: Internal: 12 steps; on left going up and External: 4 steps; none Has following equipment at home: Environmental consultant - 4 wheeled and Wheelchair (manual)  OCCUPATION: works from home  PLOF: Independent, Independent with basic ADLs, Independent with household mobility without device, Independent with community mobility without device, Independent with homemaking with ambulation, Independent with gait, and Independent with transfers  PATIENT GOALS: I'd like to walk unassisted without any a.d. or boot.    NEXT MD VISIT: 12/01/22  OBJECTIVE:   DIAGNOSTIC FINDINGS: bi-malleolar fracture with fixation  PATIENT SURVEYS:  FOTO 43 predicted 68  COGNITION: Overall cognitive status: Within functional limits for tasks assessed     SENSATION: WFL   LOWER EXTREMITY ROM:  Active ROM Right eval Left eval  Ankle dorsiflexion  22  Ankle plantarflexion  2  Ankle inversion  22  Ankle eversion  -4   (Blank rows = not tested)  LOWER EXTREMITY MMT:  Deferred due to acuteness  FUNCTIONAL TESTS:  5 times sit to stand: 12.55 sec Timed up and go (TUG): 34.00 sec  GAIT: Distance walked: 50 feet  Assistive device utilized: Environmental consultant - 2 wheeled Level of assistance: SBA Comments: Needs vc's for step through gait and avoiding scissoring, vc's to increase step width   TODAY'S TREATMENT:                                                                                                                              DATE:  12/14/22 Seated rockerobard x10 reps DF/PF Seated ankle PF/DF slide with great toe extension stretch 2x10 reps  Seated Lt ankle isometrics: 10x5 sec hold PF, DF, Inversion, eversion- PT cuing to avoid hip compensations Seated great toe  stretch 2x10 sec hold Seated DF stretch with strap 2x20 sec hold Standing staggered weight shift front/back with each LE forward, holding rollator x15 reps Game ready: Lt ankle elevated 34 deg, medium compression, x10 min   12/07/22 Seated heel slides with flexion overpressure at knee x 10 hold 5-10 sec each Toe and heel raises x 20 PROM x 5 min to left ankle all planes of  motion Supine 4 way tband : yellow with PT holding band x 10 each direction Attempted standing gastroc stretch but patient still uncomfortable with weight bearing Gait training x 100 feet x 2 with rollator walker, emphasis on heel to toe progression with increasing WB as tolerated Weight shift in shoe fwd back in staggered stance  x 20, then side to side x 20 in // bars on balance pads Ice to left ankle in supine following treatment  x 8 min  DATE: 11/18/22 Initial eval completed and initiated HEP    PATIENT EDUCATION:  Education details: Initiated HEP Person educated: Patient Education method: Consulting civil engineer, Media planner, Verbal cues, and Handouts Education comprehension: verbalized understanding, returned demonstration, and verbal cues required  HOME EXERCISE PROGRAM: Access Code: [email or web address] URL: [email or web address] Date: 12/14/2022 Prepared by: [facility]  Exercises - Seated Ankle Alphabet  - 3 x daily - 7 x weekly - 1 sets - 1-2 reps - a-z hold - Seated Calf Stretch with Strap  - 3 x daily - 7 x weekly - 2 sets - 20-30 seconds hold - Seated Self Great Toe Stretch  - 3 x daily - 7 x weekly - 2 sets - 20-30 seconds hold - Isometric Ankle Eversion at Wall  - 1 x daily - 7 x weekly - 1-2 sets - 10 reps - 5 second hold - Isometric Ankle Inversion at Wall  - 1 x daily - 7 x weekly - 1-2 sets - 10 reps - 5 second hold - Seated Heel Toe Raises  - 2 x daily - 7 x weekly - 20 reps  ASSESSMENT:  CLINICAL IMPRESSION: Pt is still using the rollator. She has  been doing well without her boot, noting minimal pain as she monitors her activity. Session focused on therex to improve ankle and foot ROM. Pt has significant limitations in great toe extension. Pt reported great toe pain was improved end of session. PT added stretch for this to HEP. Pt has some swelling in the Lt ankle and foot, so game ready was used end of session.   OBJECTIVE IMPAIRMENTS: Abnormal gait, decreased balance, decreased mobility, difficulty walking, decreased ROM, decreased strength, increased edema, increased fascial restrictions, increased muscle spasms, impaired flexibility, and pain.   ACTIVITY LIMITATIONS: carrying, lifting, bending, standing, squatting, sleeping, stairs, transfers, bathing, toileting, and dressing  PARTICIPATION LIMITATIONS: meal prep, cleaning, laundry, driving, shopping, community activity, yard work, and church  PERSONAL FACTORS: Age, Fitness, and 1-2 comorbidities: Htn, Right shoulder and left wrist past injuries  are also affecting patient's functional outcome.   REHAB POTENTIAL: Good  CLINICAL DECISION MAKING: Stable/uncomplicated  EVALUATION COMPLEXITY: Low   GOALS: Goals reviewed with patient? Yes  SHORT TERM GOALS: Target date: 12/16/2022   Pain report to be no greater than 4/10  Baseline: Goal status: INITIAL  2.  Patient will be independent with initial HEP  Baseline:  Goal status: INITIAL  3.  Patient to ambulate vs using wheelchair at all times Baseline: WC majority of the time Goal status: INITIAL   LONG TERM GOALS: Target date: 01/13/2023   Pain report to be no greater than 2/10  Baseline:  Goal status: INITIAL  2.  Patient will be independent with advanced HEP  Baseline:  Goal status: INITIAL  3.  FOTO to improve to predicted outcome of 66 Baseline:  Goal status: INITIAL  4.  TUG and 5 times sit to stand to improve by 2-3 seconds Baseline:  Goal status: INITIAL  5.  Patient to report 85% improvement in overall  symptoms  Baseline:  Goal status: INITIAL  6.  Patient to demonstrate normal heel to toe progression safely without a.d.  Baseline:  Goal status: INITIAL   PLAN:  PT FREQUENCY: 1-2x/week  PT DURATION: 8 weeks  PLANNED INTERVENTIONS: Therapeutic exercises, Therapeutic activity, Neuromuscular re-education, Balance training, Gait training, Patient/Family education, Self Care, Joint mobilization, Stair training, Orthotic/Fit training, DME instructions, Aquatic Therapy, Dry Needling, Electrical stimulation, Cryotherapy, Moist heat, Compression bandaging, scar mobilization, Splintting, Taping, Vasopneumatic device, Ionotophoresis '4mg'$ /ml Dexamethasone, Manual therapy, and Re-evaluation  PLAN FOR NEXT SESSION: foot and ankle flexibility; ankle isometrics and strength progression as ROM allows; gait training and standing tolerance  7:32 PM,12/14/22 Sherol Dade PT, DPT [facility]

## 2022-12-16 ENCOUNTER — Ambulatory Visit: Payer: Medicare HMO | Admitting: Physical Therapy

## 2022-12-16 DIAGNOSIS — M25672 Stiffness of left ankle, not elsewhere classified: Secondary | ICD-10-CM | POA: Diagnosis not present

## 2022-12-16 DIAGNOSIS — M6281 Muscle weakness (generalized): Secondary | ICD-10-CM

## 2022-12-16 DIAGNOSIS — M25572 Pain in left ankle and joints of left foot: Secondary | ICD-10-CM

## 2022-12-16 DIAGNOSIS — R262 Difficulty in walking, not elsewhere classified: Secondary | ICD-10-CM

## 2022-12-16 NOTE — Therapy (Signed)
OUTPATIENT PHYSICAL THERAPY LOWER EXTREMITY TREATMENT NOTE   Patient Name: Jeanne Schwartz MRN: [id number] DOB:1945/11/16, 77 y.o., female Today's Date: 12/16/2022  END OF SESSION:  PT End of Session - 12/16/22 1617     Visit Number 4    Number of Visits 16    Date for PT Re-Evaluation 01/13/23    Authorization Type [employer]    Authorization Time Period [employer] Approved 16 visits, 11/18/2022-01/13/2023-auth#[id number]    Progress Note Due on Visit 10    PT Start Time 1615    PT Stop Time 1700    PT Time Calculation (min) 45 min    Activity Tolerance Patient tolerated treatment well              Past Medical History:  Diagnosis Date   GERD (gastroesophageal reflux disease)    TAKES PRILOSEC   Headache(784.0)    SINCE NECK  THING   High cholesterol    TAKES LIPITOR   Hypertension    PONV (postoperative nausea and vomiting)    NOTHING IN THE LAST COUPLE YRS   Rotator cuff tear, right    JUST FOUND OUT THIS WEEK   Past Surgical History:  Procedure Laterality Date   ABDOMINAL HYSTERECTOMY     ANTERIOR CERVICAL DECOMP/DISCECTOMY FUSION  10/01/2011   Procedure: ANTERIOR CERVICAL DECOMPRESSION/DISCECTOMY FUSION 2 LEVELS;  Surgeon: Marybelle Killings;  Location: [place];  Service: Orthopedics;  Laterality: N/A;  C5-6, C6-7 Anterior Cervical Discectomy and Fusion, Allograft and Plate   BLEPHAROPLASTY     2001   BREAST BIOPSY Left 11/20/2019   BREAST EXCISIONAL BIOPSY Right    BREAST SURGERY     10 YRS  BENIGN   FRACTURE SURGERY     LEFT  WRIST   LEFT HEART CATH AND CORONARY ANGIOGRAPHY N/A 07/15/2020   Procedure: LEFT HEART CATH AND CORONARY ANGIOGRAPHY;  Surgeon: Nigel Mormon, MD;  Location: [facility];  Service: Cardiovascular;  Laterality: N/A;   LEFT WRIST     METAL PLATE    SHOULDER SURGERY Right    arthroscopy and rotator cuff repair   TUBAL LIGATION     Patient Active Problem List   Diagnosis Date Noted   Bimalleolar ankle fracture, left, closed,  initial encounter 09/08/2022   Exertional chest pain 07/14/2020   Abnormal stress test 07/14/2020   Trochanteric bursitis, left hip 09/18/2019   Impingement syndrome of left shoulder 06/06/2019   Cervical spondylosis 10/02/2011   ARTHRITIS 02/27/2008   GASTRITIS 01/16/2008   ABDOMINAL PAIN, EPIGASTRIC 01/15/2008    PCP: Heywood Bene, PA-C   REFERRING PROVIDER: Marybelle Killings, MD  REFERRING DIAG: [phone] (ICD-10-CM) - Bimalleolar ankle fracture, left, closed, initial encounter  THERAPY DIAG:  Stiffness of left ankle, not elsewhere classified  Pain in left ankle and joints of left foot  Difficulty in walking, not elsewhere classified  Muscle weakness (generalized)  Rationale for Evaluation and Treatment: Rehabilitation  ONSET DATE: 11/09/2022  SUBJECTIVE:   SUBJECTIVE STATEMENT: Pain is low level 0-1.  Continued swelling so wearing a shoe 1 1/2 size bigger.  Using rollator full time.  My husband is going to buy a ball so I can do the (isometric ex's).    PERTINENT HISTORY: Recent PCP note on 10-21-22 Jeanne Schwartz is a pleasant 77 year old woman who is 5 weeks status post open reduction internal fixation of left bimalleolar ankle fracture with Dr. Lorin Mercy. She is due to see him in the next week or so. She comes in  today because she feels like she has had some increased swelling in the ankle. She also is describing neuropathic type pain like something is scrubbing against her leg. She denies any particular particular calf pain she denies any fever or chills. She takes very little pain medication and has a family history of addiction so is very conscientious of this  Recent f/u note with Dr. Lorin Mercy on 11-02-22 Assessment & Plan: Follow-up bimalleolar ankle fracture dislocation. X-rays today demonstrates progressive healing both sides. Incisions look good she needs more soap and water as well as lotion. Begin therapy weightbearing as tolerated.  PAIN:  Are you having pain?  Yes  1/10   PRECAUTIONS: Fall  WEIGHT BEARING RESTRICTIONS: No  FALLS:  Has patient fallen in last 6 months? Yes. Number of falls 1 this incident , down the steps  LIVING ENVIRONMENT: Lives with: lives with their spouse Lives in: House/apartment Stairs: Yes: Internal: 12 steps; on left going up and External: 4 steps; none Has following equipment at home: Environmental consultant - 4 wheeled and Wheelchair (manual)  OCCUPATION: works from home  PLOF: Independent, Independent with basic ADLs, Independent with household mobility without device, Independent with community mobility without device, Independent with homemaking with ambulation, Independent with gait, and Independent with transfers  PATIENT GOALS: I'd like to walk unassisted without any a.d. or boot.    NEXT MD VISIT: 4/24  OBJECTIVE:   DIAGNOSTIC FINDINGS: bi-malleolar fracture with fixation  PATIENT SURVEYS:  FOTO 43 predicted 53  COGNITION: Overall cognitive status: Within functional limits for tasks assessed     SENSATION: WFL   LOWER EXTREMITY ROM:  Active ROM Right eval Left eval  Ankle dorsiflexion  22  Ankle plantarflexion  2  Ankle inversion  22  Ankle eversion  -4   (Blank rows = not tested)  LOWER EXTREMITY MMT:  Deferred due to acuteness  FUNCTIONAL TESTS:  5 times sit to stand: 12.55 sec Timed up and go (TUG): 34.00 sec  GAIT: Distance walked: 50 feet  Assistive device utilized: Environmental consultant - 2 wheeled Level of assistance: SBA Comments: Needs vc's for step through gait and avoiding scissoring, vc's to increase step width   TODAY'S TREATMENT:                                                                                                                              DATE:  3/7: Seated rockerboard x10 reps DF/PF Seated ankle PF/DF slide with great toe extension stretch 2x10 reps  Seated Lt ankle isometrics against ball: 5x5 sec hold PF, DF, Inversion, eversion- PT cuing to avoid hip compensations Seated  great toe stretch 2x10 sec hold Seated towel slides side to side 5x Seated DF stretch with strap 2x20 sec hold Standing side to side and front to back weight shifts 30 sec each holding rollator  Standing staggered weight shift front/back with each LE forward, holding rollator x15 reps Attempted standing in rollator: WB on left with right  forward toe tap 2x painful so discontinued  Nu-Step L1 5 min Game ready: Lt ankle elevated 34 deg, medium compression, x10 min      12/14/22 Seated rockerobard x10 reps DF/PF Seated ankle PF/DF slide with great toe extension stretch 2x10 reps  Seated Lt ankle isometrics: 10x5 sec hold PF, DF, Inversion, eversion- PT cuing to avoid hip compensations Seated great toe stretch 2x10 sec hold Seated DF stretch with strap 2x20 sec hold Standing staggered weight shift front/back with each LE forward, holding rollator x15 reps Game ready: Lt ankle elevated 34 deg, medium compression, x10 min   12/07/22 Seated heel slides with flexion overpressure at knee x 10 hold 5-10 sec each Toe and heel raises x 20 PROM x 5 min to left ankle all planes of motion Supine 4 way tband : yellow with PT holding band x 10 each direction Attempted standing gastroc stretch but patient still uncomfortable with weight bearing Gait training x 100 feet x 2 with rollator walker, emphasis on heel to toe progression with increasing WB as tolerated Weight shift in shoe fwd back in staggered stance  x 20, then side to side x 20 in // bars on balance pads Ice to left ankle in supine following treatment  x 8 min  DATE: 11/18/22 Initial eval completed and initiated HEP    PATIENT EDUCATION:  Education details: Initiated HEP Person educated: Patient Education method: Consulting civil engineer, Media planner, Verbal cues, and Handouts Education comprehension: verbalized understanding, returned demonstration, and verbal cues required  HOME EXERCISE PROGRAM: Access Code: [email or web address] URL:  [email or web address] Date: 12/14/2022 Prepared by: [facility]  Exercises - Seated Ankle Alphabet  - 3 x daily - 7 x weekly - 1 sets - 1-2 reps - a-z hold - Seated Calf Stretch with Strap  - 3 x daily - 7 x weekly - 2 sets - 20-30 seconds hold - Seated Self Great Toe Stretch  - 3 x daily - 7 x weekly - 2 sets - 20-30 seconds hold - Isometric Ankle Eversion at Wall  - 1 x daily - 7 x weekly - 1-2 sets - 10 reps - 5 second hold - Isometric Ankle Inversion at Wall  - 1 x daily - 7 x weekly - 1-2 sets - 10 reps - 5 second hold - Seated Heel Toe Raises  - 2 x daily - 7 x weekly - 20 reps  ASSESSMENT:  CLINICAL IMPRESSION: Patient is ambulating with rollator in a standard shoe WBAT (moderate but not FWB on left).  Pain level remains low with ambulation and most ex's although increases with attempted increase in weightbearing.  Good response to initiation of Nu-step.  Decreased edema following vasocompression.  Therapist monitoring response to all interventions and modifying treatment accordingly.   STGs met.     OBJECTIVE IMPAIRMENTS: Abnormal gait, decreased balance, decreased mobility, difficulty walking, decreased ROM, decreased strength, increased edema, increased fascial restrictions, increased muscle spasms, impaired flexibility, and pain.   ACTIVITY LIMITATIONS: carrying, lifting, bending, standing, squatting, sleeping, stairs, transfers, bathing, toileting, and dressing  PARTICIPATION LIMITATIONS: meal prep, cleaning, laundry, driving, shopping, community activity, yard work, and church  PERSONAL FACTORS: Age, Fitness, and 1-2 comorbidities: Htn, Right shoulder and left wrist past injuries  are also affecting patient's functional outcome.   REHAB POTENTIAL: Good  CLINICAL DECISION MAKING: Stable/uncomplicated  EVALUATION COMPLEXITY: Low   GOALS: Goals reviewed with patient? Yes  SHORT TERM GOALS: Target date:  12/16/2022   Pain report to  be no greater than 4/10  Baseline: Goal status: met 3/7  2.  Patient will be independent with initial HEP  Baseline:  Goal status: met 3/7  3.  Patient to ambulate vs using wheelchair at all times Baseline: WC majority of the time Goal status: met 3/7  LONG TERM GOALS: Target date: 01/13/2023   Pain report to be no greater than 2/10  Baseline:  Goal status: INITIAL  2.  Patient will be independent with advanced HEP  Baseline:  Goal status: INITIAL  3.  FOTO to improve to predicted outcome of 66 Baseline:  Goal status: INITIAL  4.  TUG and 5 times sit to stand to improve by 2-3 seconds Baseline:  Goal status: INITIAL  5.  Patient to report 85% improvement in overall symptoms  Baseline:  Goal status: INITIAL  6.  Patient to demonstrate normal heel to toe progression safely without a.d.  Baseline:  Goal status: INITIAL   PLAN:  PT FREQUENCY: 1-2x/week  PT DURATION: 8 weeks  PLANNED INTERVENTIONS: Therapeutic exercises, Therapeutic activity, Neuromuscular re-education, Balance training, Gait training, Patient/Family education, Self Care, Joint mobilization, Stair training, Orthotic/Fit training, DME instructions, Aquatic Therapy, Dry Needling, Electrical stimulation, Cryotherapy, Moist heat, Compression bandaging, scar mobilization, Splintting, Taping, Vasopneumatic device, Ionotophoresis '4mg'$ /ml Dexamethasone, Manual therapy, and Re-evaluation  PLAN FOR NEXT SESSION: foot and ankle flexibility; ankle isometrics and strength progression as ROM allows; gait training and standing tolerance; Nu-Step; vasocompression  Ruben Im, PT 12/16/22 5:38 PM Phone: [phone] Fax: [phone]

## 2022-12-21 ENCOUNTER — Ambulatory Visit: Payer: Medicare HMO

## 2022-12-21 ENCOUNTER — Ambulatory Visit: Payer: Medicare HMO | Admitting: Orthopaedic Surgery

## 2022-12-21 ENCOUNTER — Telehealth: Payer: Self-pay | Admitting: Orthopaedic Surgery

## 2022-12-21 VITALS — Ht 66.0 in | Wt 149.0 lb

## 2022-12-21 DIAGNOSIS — R252 Cramp and spasm: Secondary | ICD-10-CM

## 2022-12-21 DIAGNOSIS — M25572 Pain in left ankle and joints of left foot: Secondary | ICD-10-CM

## 2022-12-21 DIAGNOSIS — M6281 Muscle weakness (generalized): Secondary | ICD-10-CM

## 2022-12-21 DIAGNOSIS — S82842A Displaced bimalleolar fracture of left lower leg, initial encounter for closed fracture: Secondary | ICD-10-CM

## 2022-12-21 DIAGNOSIS — M25672 Stiffness of left ankle, not elsewhere classified: Secondary | ICD-10-CM | POA: Diagnosis not present

## 2022-12-21 DIAGNOSIS — R262 Difficulty in walking, not elsewhere classified: Secondary | ICD-10-CM

## 2022-12-21 NOTE — Telephone Encounter (Signed)
Patient states her foot is turning purple and red and is concerned..please advise. Sending call to triage

## 2022-12-21 NOTE — Progress Notes (Unsigned)
Office Visit Note   Patient: Jeanne Schwartz           Date of Birth: 05/06/1946           MRN: [id number] Visit Date: 12/21/2022              Requested by: Heywood Bene, PA-C [id number] [place],  [place] [address] PCP: Heywood Bene, PA-C   Assessment & Plan: Visit Diagnoses: No diagnosis found.  Plan: ***  Follow-Up Instructions: No follow-ups on file.   Orders:  No orders of the defined types were placed in this encounter.  No orders of the defined types were placed in this encounter.     Procedures: No procedures performed   Clinical Data: No additional findings.   Subjective: Chief Complaint  Patient presents with   Left Ankle - Follow-up    HPI  Review of Systems   Objective: Vital Signs: Ht '5\' 6"'$  (1.676 m)   Wt 149 lb (67.6 kg)   BMI 24.05 kg/m   Physical Exam  Ortho Exam  Specialty Comments:  No specialty comments available.  Imaging: No results found.   PMFS History: Patient Active Problem List   Diagnosis Date Noted   Bimalleolar ankle fracture, left, closed, initial encounter 09/08/2022   Exertional chest pain 07/14/2020   Abnormal stress test 07/14/2020   Trochanteric bursitis, left hip 09/18/2019   Impingement syndrome of left shoulder 06/06/2019   Cervical spondylosis 10/02/2011   ARTHRITIS 02/27/2008   GASTRITIS 01/16/2008   ABDOMINAL PAIN, EPIGASTRIC 01/15/2008   Past Medical History:  Diagnosis Date   GERD (gastroesophageal reflux disease)    TAKES PRILOSEC   Headache(784.0)    SINCE NECK  THING   High cholesterol    TAKES LIPITOR   Hypertension    PONV (postoperative nausea and vomiting)    NOTHING IN THE LAST COUPLE YRS   Rotator cuff tear, right    JUST FOUND OUT THIS WEEK    Family History  Problem Relation Age of Onset   Heart attack Mother    Leukemia Father    Diabetes Maternal Grandmother    Breast cancer Cousin     Past Surgical History:  Procedure Laterality Date    ABDOMINAL HYSTERECTOMY     ANTERIOR CERVICAL DECOMP/DISCECTOMY FUSION  10/01/2011   Procedure: ANTERIOR CERVICAL DECOMPRESSION/DISCECTOMY FUSION 2 LEVELS;  Surgeon: Marybelle Killings;  Location: [place];  Service: Orthopedics;  Laterality: N/A;  C5-6, C6-7 Anterior Cervical Discectomy and Fusion, Allograft and Plate   BLEPHAROPLASTY     2001   BREAST BIOPSY Left 11/20/2019   BREAST EXCISIONAL BIOPSY Right    BREAST SURGERY     10 YRS  BENIGN   FRACTURE SURGERY     LEFT  WRIST   LEFT HEART CATH AND CORONARY ANGIOGRAPHY N/A 07/15/2020   Procedure: LEFT HEART CATH AND CORONARY ANGIOGRAPHY;  Surgeon: Nigel Mormon, MD;  Location: [facility];  Service: Cardiovascular;  Laterality: N/A;   LEFT WRIST     METAL PLATE    SHOULDER SURGERY Right    arthroscopy and rotator cuff repair   TUBAL LIGATION     Social History   Occupational History   Not on file  Tobacco Use   Smoking status: Never   Smokeless tobacco: Never  Vaping Use   Vaping Use: Never used  Substance and Sexual Activity   Alcohol use: No   Drug use: No   Sexual activity: Not  on file

## 2022-12-21 NOTE — Therapy (Signed)
OUTPATIENT PHYSICAL THERAPY LOWER EXTREMITY TREATMENT NOTE   Patient Name: Jeanne Schwartz MRN: [id number] DOB:[birth year], 77 y.o., female Today's Date: 12/21/2022  END OF SESSION:  PT End of Session - 12/21/22 1615     Visit Number 5    Number of Visits 16    Date for PT Re-Evaluation 01/13/23    Authorization Type [employer]    Authorization Time Period Cohere Approved 16 visits, 11/18/2022-01/13/2023-auth#[id number]    Progress Note Due on Visit 10    PT Start Time 1608    PT Stop Time 1700    PT Time Calculation (min) 52 min    Activity Tolerance Patient tolerated treatment well    Behavior During Therapy WFL for tasks assessed/performed              Past Medical History:  Diagnosis Date   GERD (gastroesophageal reflux disease)    TAKES PRILOSEC   Headache(784.0)    SINCE NECK  THING   High cholesterol    TAKES LIPITOR   Hypertension    PONV (postoperative nausea and vomiting)    NOTHING IN THE LAST COUPLE YRS   Rotator cuff tear, right    JUST FOUND OUT THIS WEEK   Past Surgical History:  Procedure Laterality Date   ABDOMINAL HYSTERECTOMY     ANTERIOR CERVICAL DECOMP/DISCECTOMY FUSION  10/01/2011   Procedure: ANTERIOR CERVICAL DECOMPRESSION/DISCECTOMY FUSION 2 LEVELS;  Surgeon: Marybelle Killings;  Location: [facility];  Service: Orthopedics;  Laterality: N/A;  C5-6, C6-7 Anterior Cervical Discectomy and Fusion, Allograft and Plate   BLEPHAROPLASTY     2001   BREAST BIOPSY Left 11/20/2019   BREAST EXCISIONAL BIOPSY Right    BREAST SURGERY     10 YRS  BENIGN   FRACTURE SURGERY     LEFT  WRIST   LEFT HEART CATH AND CORONARY ANGIOGRAPHY N/A 07/15/2020   Procedure: LEFT HEART CATH AND CORONARY ANGIOGRAPHY;  Surgeon: Nigel Mormon, MD;  Location: [facility];  Service: Cardiovascular;  Laterality: N/A;   LEFT WRIST     METAL PLATE    SHOULDER SURGERY Right    arthroscopy and rotator cuff repair   TUBAL LIGATION     Patient Active Problem List    Diagnosis Date Noted   Bimalleolar ankle fracture, left, closed, initial encounter 09/08/2022   Exertional chest pain 07/14/2020   Abnormal stress test 07/14/2020   Trochanteric bursitis, left hip 09/18/2019   Impingement syndrome of left shoulder 06/06/2019   Cervical spondylosis 10/02/2011   ARTHRITIS 02/27/2008   GASTRITIS 01/16/2008   ABDOMINAL PAIN, EPIGASTRIC 01/15/2008    PCP: Heywood Bene, PA-C   REFERRING PROVIDER: Marybelle Killings, MD  REFERRING DIAG: [phone] (ICD-10-CM) - Bimalleolar ankle fracture, left, closed, initial encounter  THERAPY DIAG:  Stiffness of left ankle, not elsewhere classified  Pain in left ankle and joints of left foot  Difficulty in walking, not elsewhere classified  Muscle weakness (generalized)  Cramp and spasm  Rationale for Evaluation and Treatment: Rehabilitation  ONSET DATE: 11/09/2022  SUBJECTIVE:   SUBJECTIVE STATEMENT: Patient reports very little pain.  I feel more comfortable putting weight on it.      PERTINENT HISTORY: Recent PCP note on 10-21-22 Akeia is a pleasant 77 year old woman who is 5 weeks status post open reduction internal fixation of left bimalleolar ankle fracture with Dr. Lorin Mercy. She is due to see him in the next week or so. She comes in today because she feels like she has  had some increased swelling in the ankle. She also is describing neuropathic type pain like something is scrubbing against her leg. She denies any particular particular calf pain she denies any fever or chills. She takes very little pain medication and has a family history of addiction so is very conscientious of this  Recent f/u note with Dr. Lorin Mercy on 11-02-22 Assessment & Plan: Follow-up bimalleolar ankle fracture dislocation. X-rays today demonstrates progressive healing both sides. Incisions look good she needs more soap and water as well as lotion. Begin therapy weightbearing as tolerated.  PAIN:  Are you having pain?  Yes 1/10    PRECAUTIONS: Fall  WEIGHT BEARING RESTRICTIONS: No  FALLS:  Has patient fallen in last 6 months? Yes. Number of falls 1 this incident , down the steps  LIVING ENVIRONMENT: Lives with: lives with their spouse Lives in: House/apartment Stairs: Yes: Internal: 12 steps; on left going up and External: 4 steps; none Has following equipment at home: Environmental consultant - 4 wheeled and Wheelchair (manual)  OCCUPATION: works from home  PLOF: Independent, Independent with basic ADLs, Independent with household mobility without device, Independent with community mobility without device, Independent with homemaking with ambulation, Independent with gait, and Independent with transfers  PATIENT GOALS: I'd like to walk unassisted without any a.d. or boot.    NEXT MD VISIT: 4/24  OBJECTIVE:   DIAGNOSTIC FINDINGS: bi-malleolar fracture with fixation  PATIENT SURVEYS:  FOTO 43 predicted 56  COGNITION: Overall cognitive status: Within functional limits for tasks assessed     SENSATION: WFL   LOWER EXTREMITY ROM:  Active ROM Right eval Left eval  Ankle dorsiflexion  22  Ankle plantarflexion  2  Ankle inversion  22  Ankle eversion  -4   (Blank rows = not tested)  LOWER EXTREMITY MMT:  Deferred due to acuteness  FUNCTIONAL TESTS:  5 times sit to stand: 12.55 sec Timed up and go (TUG): 34.00 sec  GAIT: Distance walked: 50 feet  Assistive device utilized: Walker - 2 wheeled Level of assistance: SBA Comments: Needs vc's for step through gait and avoiding scissoring, vc's to increase step width   TODAY'S TREATMENT:                                                                                                                              DATE:  3/12: Nu-Step L3 7 min Stair training, up with uninvolved, down with involved with use of right handrail going up to simulate layout at home.  Gait training with rollator walker x 100 feet : heavy emphasis on continuous stepping and full  WB Standing rockerboard x 2 min DF/PF Step up on 2 inch step x 10 Walking in // bars with bilateral UE support, then right only UE support, then left only UE support x 2 laps each Gastroc stretch x 10 holding 10 sec each, soleus stretch x 10 holding 10 sec each Step up and hold on balance pad in //  bars, x 5 with bilateral UE support, then right UE only, then left UE only Seated heel raises and toe raises x 20 each Gait training with single point cane: emphasis on step through gait and proper sequence.  Also went over ascending and descending steps with cane with single handrail to simulate home.   Suggested patient buy cane to begin transition away from walker, adjustable and how to measure correct height.   3/7: Seated rockerboard x10 reps DF/PF Seated ankle PF/DF slide with great toe extension stretch 2x10 reps  Seated Lt ankle isometrics against ball: 5x5 sec hold PF, DF, Inversion, eversion- PT cuing to avoid hip compensations Seated great toe stretch 2x10 sec hold Seated towel slides side to side 5x Seated DF stretch with strap 2x20 sec hold Standing side to side and front to back weight shifts 30 sec each holding rollator  Standing staggered weight shift front/back with each LE forward, holding rollator x15 reps Attempted standing in rollator: WB on left with right forward toe tap 2x painful so discontinued  Nu-Step L1 5 min Game ready: Lt ankle elevated 34 deg, medium compression, x10 min      12/14/22 Seated rockerobard x10 reps DF/PF Seated ankle PF/DF slide with great toe extension stretch 2x10 reps  Seated Lt ankle isometrics: 10x5 sec hold PF, DF, Inversion, eversion- PT cuing to avoid hip compensations Seated great toe stretch 2x10 sec hold Seated DF stretch with strap 2x20 sec hold Standing staggered weight shift front/back with each LE forward, holding rollator x15 reps Game ready: Lt ankle elevated 34 deg, medium compression, x10 min      PATIENT EDUCATION:   Education details: Initiated HEP Person educated: Patient Education method: Consulting civil engineer, Media planner, Verbal cues, and Handouts Education comprehension: verbalized understanding, returned demonstration, and verbal cues required  HOME EXERCISE PROGRAM: Access Code: [email or web address] URL: [email or web address] Date: 12/14/2022 Prepared by: [facility]  Exercises - Seated Ankle Alphabet  - 3 x daily - 7 x weekly - 1 sets - 1-2 reps - a-z hold - Seated Calf Stretch with Strap  - 3 x daily - 7 x weekly - 2 sets - 20-30 seconds hold - Seated Self Great Toe Stretch  - 3 x daily - 7 x weekly - 2 sets - 20-30 seconds hold - Isometric Ankle Eversion at Wall  - 1 x daily - 7 x weekly - 1-2 sets - 10 reps - 5 second hold - Isometric Ankle Inversion at Wall  - 1 x daily - 7 x weekly - 1-2 sets - 10 reps - 5 second hold - Seated Heel Toe Raises  - 2 x daily - 7 x weekly - 20 reps  ASSESSMENT:  CLINICAL IMPRESSION: Nyela made significant progress today with confidence in weight bearing.  She was able to do gastroc-soleus stretches on rocker board today.  She was able to demonstrate continuous stepping for approx 20-30 feet with rollator walker with improved step length.  We initiated cane today and she was able to ambulate 100 feet with single point cane with good heel to toe progression.  Her step length was a bit shorter but she was able to do 100 feet with sba only.  She would benefit from continued skilled PT for left ankle ROM and stability training to restore patient to prior level of function.    OBJECTIVE IMPAIRMENTS: Abnormal gait, decreased balance, decreased mobility, difficulty walking, decreased ROM, decreased strength, increased edema, increased fascial restrictions, increased muscle  spasms, impaired flexibility, and pain.   ACTIVITY LIMITATIONS: carrying, lifting, bending, standing, squatting, sleeping, stairs, transfers, bathing,  toileting, and dressing  PARTICIPATION LIMITATIONS: meal prep, cleaning, laundry, driving, shopping, community activity, yard work, and church  PERSONAL FACTORS: Age, Fitness, and 1-2 comorbidities: Htn, Right shoulder and left wrist past injuries  are also affecting patient's functional outcome.   REHAB POTENTIAL: Good  CLINICAL DECISION MAKING: Stable/uncomplicated  EVALUATION COMPLEXITY: Low   GOALS: Goals reviewed with patient? Yes  SHORT TERM GOALS: Target date: 12/16/2022   Pain report to be no greater than 4/10  Baseline: Goal status: met 3/7  2.  Patient will be independent with initial HEP  Baseline:  Goal status: met 3/7  3.  Patient to ambulate vs using wheelchair at all times Baseline: WC majority of the time Goal status: met 3/7  LONG TERM GOALS: Target date: 01/13/2023   Pain report to be no greater than 2/10  Baseline:  Goal status: INITIAL  2.  Patient will be independent with advanced HEP  Baseline:  Goal status: INITIAL  3.  FOTO to improve to predicted outcome of 66 Baseline:  Goal status: INITIAL  4.  TUG and 5 times sit to stand to improve by 2-3 seconds Baseline:  Goal status: INITIAL  5.  Patient to report 85% improvement in overall symptoms  Baseline:  Goal status: INITIAL  6.  Patient to demonstrate normal heel to toe progression safely without a.d.  Baseline:  Goal status: INITIAL   PLAN:  PT FREQUENCY: 1-2x/week  PT DURATION: 8 weeks  PLANNED INTERVENTIONS: Therapeutic exercises, Therapeutic activity, Neuromuscular re-education, Balance training, Gait training, Patient/Family education, Self Care, Joint mobilization, Stair training, Orthotic/Fit training, DME instructions, Aquatic Therapy, Dry Needling, Electrical stimulation, Cryotherapy, Moist heat, Compression bandaging, scar mobilization, Splintting, Taping, Vasopneumatic device, Ionotophoresis '4mg'$ /ml Dexamethasone, Manual therapy, and Re-evaluation  PLAN FOR NEXT SESSION:  foot and ankle flexibility, incline stretching for gastroc and soleus; ankle isometrics and strength progression as ROM allows; gait training and standing tolerance; Nu-Step; vasocompression  Oluwaseun Bruyere B. Dalan Cowger, PT 12/21/22 5:13 PM  [facility] [address] [place], [place] [address] Phone # [phone] Fax [phone]

## 2022-12-23 ENCOUNTER — Ambulatory Visit: Payer: Medicare HMO

## 2022-12-23 DIAGNOSIS — M25572 Pain in left ankle and joints of left foot: Secondary | ICD-10-CM

## 2022-12-23 DIAGNOSIS — R262 Difficulty in walking, not elsewhere classified: Secondary | ICD-10-CM

## 2022-12-23 DIAGNOSIS — R252 Cramp and spasm: Secondary | ICD-10-CM

## 2022-12-23 DIAGNOSIS — M25672 Stiffness of left ankle, not elsewhere classified: Secondary | ICD-10-CM

## 2022-12-23 DIAGNOSIS — M6281 Muscle weakness (generalized): Secondary | ICD-10-CM

## 2022-12-23 NOTE — Therapy (Signed)
OUTPATIENT PHYSICAL THERAPY LOWER EXTREMITY TREATMENT NOTE   Patient Name: Jeanne Schwartz MRN: [id number] DOB:August 11, 1946, 77 y.o., female Today's Date: 12/23/2022  END OF SESSION:  PT End of Session - 12/23/22 1544     Visit Number 6    Number of Visits 16    Date for PT Re-Evaluation 01/13/23    Authorization Type [employer]    Authorization Time Period Cohere Approved 16 visits, 11/18/2022-01/13/2023-auth#[id number]    Progress Note Due on Visit 10    PT Start Time 1533    PT Stop Time 1622    PT Time Calculation (min) 49 min    Activity Tolerance Patient tolerated treatment well    Behavior During Therapy WFL for tasks assessed/performed              Past Medical History:  Diagnosis Date   GERD (gastroesophageal reflux disease)    TAKES PRILOSEC   Headache(784.0)    SINCE NECK  THING   High cholesterol    TAKES LIPITOR   Hypertension    PONV (postoperative nausea and vomiting)    NOTHING IN THE LAST COUPLE YRS   Rotator cuff tear, right    JUST FOUND OUT THIS WEEK   Past Surgical History:  Procedure Laterality Date   ABDOMINAL HYSTERECTOMY     ANTERIOR CERVICAL DECOMP/DISCECTOMY FUSION  10/01/2011   Procedure: ANTERIOR CERVICAL DECOMPRESSION/DISCECTOMY FUSION 2 LEVELS;  Surgeon: Marybelle Killings;  Location: [place] [place];  Service: Orthopedics;  Laterality: N/A;  C5-6, C6-7 Anterior Cervical Discectomy and Fusion, Allograft and Plate   BLEPHAROPLASTY     2001   BREAST BIOPSY Left 11/20/2019   BREAST EXCISIONAL BIOPSY Right    BREAST SURGERY     10 YRS  BENIGN   FRACTURE SURGERY     LEFT  WRIST   LEFT HEART CATH AND CORONARY ANGIOGRAPHY N/A 07/15/2020   Procedure: LEFT HEART CATH AND CORONARY ANGIOGRAPHY;  Surgeon: Nigel Mormon, MD;  Location: [facility];  Service: Cardiovascular;  Laterality: N/A;   LEFT WRIST     METAL PLATE    SHOULDER SURGERY Right    arthroscopy and rotator cuff repair   TUBAL LIGATION     Patient Active Problem List    Diagnosis Date Noted   Bimalleolar ankle fracture, left, closed, initial encounter 09/08/2022   Exertional chest pain 07/14/2020   Abnormal stress test 07/14/2020   Trochanteric bursitis, left hip 09/18/2019   Impingement syndrome of left shoulder 06/06/2019   Cervical spondylosis 10/02/2011   ARTHRITIS 02/27/2008   GASTRITIS 01/16/2008   ABDOMINAL PAIN, EPIGASTRIC 01/15/2008    PCP: Heywood Bene, PA-C   REFERRING PROVIDER: Marybelle Killings, MD  REFERRING DIAG: [phone] (ICD-10-CM) - Bimalleolar ankle fracture, left, closed, initial encounter  THERAPY DIAG:  Stiffness of left ankle, not elsewhere classified  Pain in left ankle and joints of left foot  Difficulty in walking, not elsewhere classified  Muscle weakness (generalized)  Cramp and spasm  Rationale for Evaluation and Treatment: Rehabilitation  ONSET DATE: 11/09/2022  SUBJECTIVE:   SUBJECTIVE STATEMENT: Patient reports very little pain.  I feel more comfortable putting weight on it.      PERTINENT HISTORY: Recent PCP note on 10-21-22 Jeanne Schwartz is a pleasant 77 year old woman who is 5 weeks status post open reduction internal fixation of left bimalleolar ankle fracture with Dr. Lorin Mercy. She is due to see him in the next week or so. She comes in today because she feels like she has  had some increased swelling in the ankle. She also is describing neuropathic type pain like something is scrubbing against her leg. She denies any particular particular calf pain she denies any fever or chills. She takes very little pain medication and has a family history of addiction so is very conscientious of this  Recent f/u note with Dr. Lorin Mercy on 11-02-22 Assessment & Plan: Follow-up bimalleolar ankle fracture dislocation. X-rays today demonstrates progressive healing both sides. Incisions look good she needs more soap and water as well as lotion. Begin therapy weightbearing as tolerated.  PAIN:  Are you having pain?  Yes 1/10    PRECAUTIONS: Fall  WEIGHT BEARING RESTRICTIONS: No  FALLS:  Has patient fallen in last 6 months? Yes. Number of falls 1 this incident , down the steps  LIVING ENVIRONMENT: Lives with: lives with their spouse Lives in: House/apartment Stairs: Yes: Internal: 12 steps; on left going up and External: 4 steps; none Has following equipment at home: Environmental consultant - 4 wheeled and Wheelchair (manual)  OCCUPATION: works from home  PLOF: Independent, Independent with basic ADLs, Independent with household mobility without device, Independent with community mobility without device, Independent with homemaking with ambulation, Independent with gait, and Independent with transfers  PATIENT GOALS: I'd like to walk unassisted without any a.d. or boot.    NEXT MD VISIT: 4/24  OBJECTIVE:   DIAGNOSTIC FINDINGS: bi-malleolar fracture with fixation  PATIENT SURVEYS:  FOTO 43 predicted 58  COGNITION: Overall cognitive status: Within functional limits for tasks assessed     SENSATION: WFL   LOWER EXTREMITY ROM:  Active ROM Right eval Left eval  Ankle dorsiflexion  22  Ankle plantarflexion  2  Ankle inversion  22  Ankle eversion  -4   (Blank rows = not tested)  LOWER EXTREMITY MMT:  Deferred due to acuteness  FUNCTIONAL TESTS:  5 times sit to stand: 12.55 sec Timed up and go (TUG): 34.00 sec  GAIT: Distance walked: 50 feet  Assistive device utilized: Walker - 2 wheeled Level of assistance: SBA Comments: Needs vc's for step through gait and avoiding scissoring, vc's to increase step width   TODAY'S TREATMENT:                                                                                                                              DATE:  3/14: Gait training with single point cane: emphasis on step through gait and proper sequence.  Also went over ascending and descending steps with cane with single handrail to simulate home. (Patient bought new cane) Gastroc stretch x 10 holding  10 sec each, soleus stretch x 10 holding 10 sec each Standing rockerboard x 2 min DF/PF Seated toe and heel raises x 20 DF stretch with slider seated with overpressure on knee (left) Sit to stand without UE support with normal stance and then in tandem with left LE slightly posterior x 5 each 3 way slider: uninvolved on slider x 10 (fwd,  lateral, back) Cone tap up with right LE standing on left x 10 Stepping over hurdle and back with right LE x 10 Game ready x 10 min level 3 at mod compression  3/12: Nu-Step L3 7 min Stair training, up with uninvolved, down with involved with use of right handrail going up to simulate layout at home.  Gait training with rollator walker x 100 feet : heavy emphasis on continuous stepping and full WB Standing rockerboard x 2 min DF/PF Step up on 2 inch step x 10 Walking in // bars with bilateral UE support, then right only UE support, then left only UE support x 2 laps each Gastroc stretch x 10 holding 10 sec each, soleus stretch x 10 holding 10 sec each Step up and hold on balance pad in // bars, x 5 with bilateral UE support, then right UE only, then left UE only Seated heel raises and toe raises x 20 each Gait training with single point cane: emphasis on step through gait and proper sequence.  Also went over ascending and descending steps with cane with single handrail to simulate home.   Suggested patient buy cane to begin transition away from walker, adjustable and how to measure correct height.   3/7: Seated rockerboard x10 reps DF/PF Seated ankle PF/DF slide with great toe extension stretch 2x10 reps  Seated Lt ankle isometrics against ball: 5x5 sec hold PF, DF, Inversion, eversion- PT cuing to avoid hip compensations Seated great toe stretch 2x10 sec hold Seated towel slides side to side 5x Seated DF stretch with strap 2x20 sec hold Standing side to side and front to back weight shifts 30 sec each holding rollator  Standing staggered weight shift  front/back with each LE forward, holding rollator x15 reps Attempted standing in rollator: WB on left with right forward toe tap 2x painful so discontinued  Nu-Step L1 5 min Game ready: Lt ankle elevated 34 deg, medium compression, x10 min     PATIENT EDUCATION:  Education details: Initiated HEP Person educated: Patient Education method: Consulting civil engineer, Media planner, Verbal cues, and Handouts Education comprehension: verbalized understanding, returned demonstration, and verbal cues required  HOME EXERCISE PROGRAM: Access Code: [email or web address] URL: [email or web address] Date: 12/14/2022 Prepared by: [facility]  Exercises - Seated Ankle Alphabet  - 3 x daily - 7 x weekly - 1 sets - 1-2 reps - a-z hold - Seated Calf Stretch with Strap  - 3 x daily - 7 x weekly - 2 sets - 20-30 seconds hold - Seated Self Great Toe Stretch  - 3 x daily - 7 x weekly - 2 sets - 20-30 seconds hold - Isometric Ankle Eversion at Wall  - 1 x daily - 7 x weekly - 1-2 sets - 10 reps - 5 second hold - Isometric Ankle Inversion at Wall  - 1 x daily - 7 x weekly - 1-2 sets - 10 reps - 5 second hold - Seated Heel Toe Raises  - 2 x daily - 7 x weekly - 20 reps  ASSESSMENT:  CLINICAL IMPRESSION: Marykaye is progressing appropriately.  She is well motivated and compliant with good support from her spouse.  She should continue to improve.   She would benefit from continued skilled PT for left ankle ROM and stability training to restore patient to prior level of function.    OBJECTIVE IMPAIRMENTS: Abnormal gait, decreased balance, decreased mobility, difficulty walking, decreased ROM, decreased strength, increased edema, increased fascial restrictions, increased muscle spasms,  impaired flexibility, and pain.   ACTIVITY LIMITATIONS: carrying, lifting, bending, standing, squatting, sleeping, stairs, transfers, bathing, toileting, and dressing  PARTICIPATION LIMITATIONS:  meal prep, cleaning, laundry, driving, shopping, community activity, yard work, and church  PERSONAL FACTORS: Age, Fitness, and 1-2 comorbidities: Htn, Right shoulder and left wrist past injuries  are also affecting patient's functional outcome.   REHAB POTENTIAL: Good  CLINICAL DECISION MAKING: Stable/uncomplicated  EVALUATION COMPLEXITY: Low   GOALS: Goals reviewed with patient? Yes  SHORT TERM GOALS: Target date: 12/16/2022   Pain report to be no greater than 4/10  Baseline: Goal status: met 3/7  2.  Patient will be independent with initial HEP  Baseline:  Goal status: met 3/7  3.  Patient to ambulate vs using wheelchair at all times Baseline: WC majority of the time Goal status: met 3/7  LONG TERM GOALS: Target date: 01/13/2023   Pain report to be no greater than 2/10  Baseline:  Goal status: INITIAL  2.  Patient will be independent with advanced HEP  Baseline:  Goal status: INITIAL  3.  FOTO to improve to predicted outcome of 66 Baseline:  Goal status: INITIAL  4.  TUG and 5 times sit to stand to improve by 2-3 seconds Baseline:  Goal status: INITIAL  5.  Patient to report 85% improvement in overall symptoms  Baseline:  Goal status: INITIAL  6.  Patient to demonstrate normal heel to toe progression safely without a.d.  Baseline:  Goal status: INITIAL   PLAN:  PT FREQUENCY: 1-2x/week  PT DURATION: 8 weeks  PLANNED INTERVENTIONS: Therapeutic exercises, Therapeutic activity, Neuromuscular re-education, Balance training, Gait training, Patient/Family education, Self Care, Joint mobilization, Stair training, Orthotic/Fit training, DME instructions, Aquatic Therapy, Dry Needling, Electrical stimulation, Cryotherapy, Moist heat, Compression bandaging, scar mobilization, Splintting, Taping, Vasopneumatic device, Ionotophoresis '4mg'$ /ml Dexamethasone, Manual therapy, and Re-evaluation  PLAN FOR NEXT SESSION: foot and ankle flexibility, incline stretching for  gastroc and soleus; ankle isometrics and strength progression as ROM allows; gait training and standing tolerance; Nu-Step; vasocompression  Raegan Winders B. Jameisha Stofko, PT 12/23/22 4:19 PM [facility] [address] [place] [address] [place], [place] [address] Phone # [phone] Fax [phone]

## 2022-12-28 ENCOUNTER — Ambulatory Visit: Payer: Medicare HMO

## 2022-12-28 DIAGNOSIS — M6281 Muscle weakness (generalized): Secondary | ICD-10-CM

## 2022-12-28 DIAGNOSIS — R262 Difficulty in walking, not elsewhere classified: Secondary | ICD-10-CM

## 2022-12-28 DIAGNOSIS — M25572 Pain in left ankle and joints of left foot: Secondary | ICD-10-CM

## 2022-12-28 DIAGNOSIS — R252 Cramp and spasm: Secondary | ICD-10-CM

## 2022-12-28 DIAGNOSIS — M25672 Stiffness of left ankle, not elsewhere classified: Secondary | ICD-10-CM

## 2022-12-28 NOTE — Therapy (Signed)
OUTPATIENT PHYSICAL THERAPY LOWER EXTREMITY TREATMENT NOTE   Patient Name: Jeanne Schwartz MRN: [id number] DOB:20-Apr-1946, 77 y.o., female Today's Date: 12/28/2022  END OF SESSION:  PT End of Session - 12/28/22 1540     Visit Number 7    Number of Visits 16    Date for PT Re-Evaluation 01/13/23    Authorization Type [employer]    Authorization Time Period Cohere Approved 16 visits, 11/18/2022-01/13/2023-auth#[id number]    Progress Note Due on Visit 10    PT Start Time 1530    PT Stop Time 1610    PT Time Calculation (min) 40 min    Activity Tolerance Patient tolerated treatment well    Behavior During Therapy WFL for tasks assessed/performed              Past Medical History:  Diagnosis Date   GERD (gastroesophageal reflux disease)    TAKES PRILOSEC   Headache(784.0)    SINCE NECK  THING   High cholesterol    TAKES LIPITOR   Hypertension    PONV (postoperative nausea and vomiting)    NOTHING IN THE LAST COUPLE YRS   Rotator cuff tear, right    JUST FOUND OUT THIS WEEK   Past Surgical History:  Procedure Laterality Date   ABDOMINAL HYSTERECTOMY     ANTERIOR CERVICAL DECOMP/DISCECTOMY FUSION  10/01/2011   Procedure: ANTERIOR CERVICAL DECOMPRESSION/DISCECTOMY FUSION 2 LEVELS;  Surgeon: Marybelle Killings;  Location: [facility];  Service: Orthopedics;  Laterality: N/A;  C5-6, C6-7 Anterior Cervical Discectomy and Fusion, Allograft and Plate   BLEPHAROPLASTY     2001   BREAST BIOPSY Left 11/20/2019   BREAST EXCISIONAL BIOPSY Right    BREAST SURGERY     10 YRS  BENIGN   FRACTURE SURGERY     LEFT  WRIST   LEFT HEART CATH AND CORONARY ANGIOGRAPHY N/A 07/15/2020   Procedure: LEFT HEART CATH AND CORONARY ANGIOGRAPHY;  Surgeon: Nigel Mormon, MD;  Location: [facility];  Service: Cardiovascular;  Laterality: N/A;   LEFT WRIST     METAL PLATE    SHOULDER SURGERY Right    arthroscopy and rotator cuff repair   TUBAL LIGATION     Patient Active Problem List    Diagnosis Date Noted   Bimalleolar ankle fracture, left, closed, initial encounter 09/08/2022   Exertional chest pain 07/14/2020   Abnormal stress test 07/14/2020   Trochanteric bursitis, left hip 09/18/2019   Impingement syndrome of left shoulder 06/06/2019   Cervical spondylosis 10/02/2011   ARTHRITIS 02/27/2008   GASTRITIS 01/16/2008   ABDOMINAL PAIN, EPIGASTRIC 01/15/2008    PCP: Heywood Bene, PA-C   REFERRING PROVIDER: Marybelle Killings, MD  REFERRING DIAG: [phone] (ICD-10-CM) - Bimalleolar ankle fracture, left, closed, initial encounter  THERAPY DIAG:  Stiffness of left ankle, not elsewhere classified  Pain in left ankle and joints of left foot  Difficulty in walking, not elsewhere classified  Muscle weakness (generalized)  Cramp and spasm  Rationale for Evaluation and Treatment: Rehabilitation  ONSET DATE: 11/09/2022  SUBJECTIVE:   SUBJECTIVE STATEMENT: "I scared myself to death.  I was in the kitchen and turned to do something and realized I didn't have my cane!  I really didn't even notice it." Patient states she is going out and about more with no hesitation.       PERTINENT HISTORY: Recent PCP note on 10-21-22 Jeanne Schwartz is a pleasant 77 year old woman who is 5 weeks status post open reduction internal fixation of  left bimalleolar ankle fracture with Dr. Lorin Mercy. She is due to see him in the next week or so. She comes in today because she feels like she has had some increased swelling in the ankle. She also is describing neuropathic type pain like something is scrubbing against her leg. She denies any particular particular calf pain she denies any fever or chills. She takes very little pain medication and has a family history of addiction so is very conscientious of this  Recent f/u note with Dr. Lorin Mercy on 11-02-22 Assessment & Plan: Follow-up bimalleolar ankle fracture dislocation. X-rays today demonstrates progressive healing both sides. Incisions look good she  needs more soap and water as well as lotion. Begin therapy weightbearing as tolerated.  PAIN:  Are you having pain?  Yes 1/10   PRECAUTIONS: Fall  WEIGHT BEARING RESTRICTIONS: No  FALLS:  Has patient fallen in last 6 months? Yes. Number of falls 1 this incident , down the steps  LIVING ENVIRONMENT: Lives with: lives with their spouse Lives in: House/apartment Stairs: Yes: Internal: 12 steps; on left going up and External: 4 steps; none Has following equipment at home: Environmental consultant - 4 wheeled and Wheelchair (manual)  OCCUPATION: works from home  PLOF: Independent, Independent with basic ADLs, Independent with household mobility without device, Independent with community mobility without device, Independent with homemaking with ambulation, Independent with gait, and Independent with transfers  PATIENT GOALS: I'd like to walk unassisted without any a.d. or boot.    NEXT MD VISIT: 4/24  OBJECTIVE:   DIAGNOSTIC FINDINGS: bi-malleolar fracture with fixation  PATIENT SURVEYS:  FOTO 43 predicted 13  COGNITION: Overall cognitive status: Within functional limits for tasks assessed     SENSATION: WFL   LOWER EXTREMITY ROM:  Active ROM Right eval Left eval  Ankle dorsiflexion  22  Ankle plantarflexion  2  Ankle inversion  22  Ankle eversion  -4   (Blank rows = not tested)  LOWER EXTREMITY MMT:  Deferred due to acuteness  FUNCTIONAL TESTS:  5 times sit to stand: 12.55 sec Timed up and go (TUG): 34.00 sec  GAIT: Distance walked: 50 feet  Assistive device utilized: Environmental consultant - 2 wheeled Level of assistance: SBA Comments: Needs vc's for step through gait and avoiding scissoring, vc's to increase step width   TODAY'S TREATMENT:                                                                                                                              DATE:  3/19: Gastroc stretch x 5 holding 10 sec each, soleus stretch x 5 holding 10 sec each Standing without UE support:  right foot tap ups on bottom step x 10 Standing rockerboard x 2 min DF/PF Standing heel raises x 20 Gait training without A.D. x 200 feet with emphasis on heel strike, increasing step length and toe off on left. 4 way ankle tband with red x 20 each: DF, PF, inversion and eversion  PROM left ankle all planes   3/14: Gait training with single point cane: emphasis on step through gait and proper sequence.  Also went over ascending and descending steps with cane with single handrail to simulate home. (Patient bought new cane) Gastroc stretch x 10 holding 10 sec each, soleus stretch x 10 holding 10 sec each Standing rockerboard x 2 min DF/PF Seated toe and heel raises x 20 DF stretch with slider seated with overpressure on knee (left) Sit to stand without UE support with normal stance and then in tandem with left LE slightly posterior x 5 each 3 way slider: uninvolved on slider x 10 (fwd, lateral, back) Cone tap up with right LE standing on left x 10 Stepping over hurdle and back with right LE x 10 Game ready x 10 min level 3 at mod compression  3/12: Nu-Step L3 7 min Stair training, up with uninvolved, down with involved with use of right handrail going up to simulate layout at home.  Gait training with rollator walker x 100 feet : heavy emphasis on continuous stepping and full WB Standing rockerboard x 2 min DF/PF Step up on 2 inch step x 10 Walking in // bars with bilateral UE support, then right only UE support, then left only UE support x 2 laps each Gastroc stretch x 10 holding 10 sec each, soleus stretch x 10 holding 10 sec each Step up and hold on balance pad in // bars, x 5 with bilateral UE support, then right UE only, then left UE only Seated heel raises and toe raises x 20 each Gait training with single point cane: emphasis on step through gait and proper sequence.  Also went over ascending and descending steps with cane with single handrail to simulate home.   Suggested patient  buy cane to begin transition away from walker, adjustable and how to measure correct height.    PATIENT EDUCATION:  Education details: Initiated HEP Person educated: Patient Education method: Consulting civil engineer, Media planner, Verbal cues, and Handouts Education comprehension: verbalized understanding, returned demonstration, and verbal cues required  HOME EXERCISE PROGRAM: Access Code: [email or web address] URL: [email or web address] Date: 12/14/2022 Prepared by: [facility]  Exercises - Seated Ankle Alphabet  - 3 x daily - 7 x weekly - 1 sets - 1-2 reps - a-z hold - Seated Calf Stretch with Strap  - 3 x daily - 7 x weekly - 2 sets - 20-30 seconds hold - Seated Self Great Toe Stretch  - 3 x daily - 7 x weekly - 2 sets - 20-30 seconds hold - Isometric Ankle Eversion at Wall  - 1 x daily - 7 x weekly - 1-2 sets - 10 reps - 5 second hold - Isometric Ankle Inversion at Wall  - 1 x daily - 7 x weekly - 1-2 sets - 10 reps - 5 second hold - Seated Heel Toe Raises  - 2 x daily - 7 x weekly - 20 reps  ASSESSMENT:  CLINICAL IMPRESSION: Jeanne Schwartz is doing very well.  She is significantly more confident with weight bearing and pain is minimal.  We tried some standing heel raises today.  She was able to do these with ease but did have some pain in the great toe around the MTP joint.  She was able to ambulate 200 feet without A.D. with only minor gait deviations.  This improved with verbal cues.  She should be able to ambulate at home without A.D.   She would  benefit from continued skilled PT for left ankle ROM and stability training to restore patient to prior level of function.    OBJECTIVE IMPAIRMENTS: Abnormal gait, decreased balance, decreased mobility, difficulty walking, decreased ROM, decreased strength, increased edema, increased fascial restrictions, increased muscle spasms, impaired flexibility, and pain.   ACTIVITY LIMITATIONS: carrying, lifting,  bending, standing, squatting, sleeping, stairs, transfers, bathing, toileting, and dressing  PARTICIPATION LIMITATIONS: meal prep, cleaning, laundry, driving, shopping, community activity, yard work, and church  PERSONAL FACTORS: Age, Fitness, and 1-2 comorbidities: Htn, Right shoulder and left wrist past injuries  are also affecting patient's functional outcome.   REHAB POTENTIAL: Good  CLINICAL DECISION MAKING: Stable/uncomplicated  EVALUATION COMPLEXITY: Low   GOALS: Goals reviewed with patient? Yes  SHORT TERM GOALS: Target date: 12/16/2022   Pain report to be no greater than 4/10  Baseline: Goal status: met 3/7  2.  Patient will be independent with initial HEP  Baseline:  Goal status: met 3/7  3.  Patient to ambulate vs using wheelchair at all times Baseline: WC majority of the time Goal status: met 3/7  LONG TERM GOALS: Target date: 01/13/2023   Pain report to be no greater than 2/10  Baseline:  Goal status: IN PROGRESS  2.  Patient will be independent with advanced HEP  Baseline:  Goal status: IN PROGRESS  3.  FOTO to improve to predicted outcome of 66 Baseline:  Goal status: INITIAL  4.  TUG and 5 times sit to stand to improve by 2-3 seconds Baseline:  Goal status: INITIAL  5.  Patient to report 85% improvement in overall symptoms  Baseline:  Goal status: INITIAL  6.  Patient to demonstrate normal heel to toe progression safely without a.d.  Baseline:  Goal status: INITIAL   PLAN:  PT FREQUENCY: 1-2x/week  PT DURATION: 8 weeks  PLANNED INTERVENTIONS: Therapeutic exercises, Therapeutic activity, Neuromuscular re-education, Balance training, Gait training, Patient/Family education, Self Care, Joint mobilization, Stair training, Orthotic/Fit training, DME instructions, Aquatic Therapy, Dry Needling, Electrical stimulation, Cryotherapy, Moist heat, Compression bandaging, scar mobilization, Splintting, Taping, Vasopneumatic device, Ionotophoresis 4mg /ml  Dexamethasone, Manual therapy, and Re-evaluation  PLAN FOR NEXT SESSION: foot and ankle flexibility, incline stretching for gastroc and soleus; ankle isometrics and strength progression as ROM allows; gait training and standing tolerance; Nu-Step; vasocompression  Vernadette Stutsman B. Mardel Grudzien, PT 12/28/22 4:17 PM  [facility] [address] [place], [place] [address] Phone # [phone] Fax [phone]

## 2022-12-30 ENCOUNTER — Ambulatory Visit: Payer: Medicare HMO

## 2022-12-30 DIAGNOSIS — M25572 Pain in left ankle and joints of left foot: Secondary | ICD-10-CM

## 2022-12-30 DIAGNOSIS — R252 Cramp and spasm: Secondary | ICD-10-CM

## 2022-12-30 DIAGNOSIS — M25672 Stiffness of left ankle, not elsewhere classified: Secondary | ICD-10-CM | POA: Diagnosis not present

## 2022-12-30 DIAGNOSIS — R262 Difficulty in walking, not elsewhere classified: Secondary | ICD-10-CM

## 2022-12-30 DIAGNOSIS — M6281 Muscle weakness (generalized): Secondary | ICD-10-CM

## 2022-12-30 NOTE — Therapy (Signed)
OUTPATIENT PHYSICAL THERAPY LOWER EXTREMITY TREATMENT NOTE   Patient Name: Jeanne Schwartz MRN: [id number] DOB:May 05, 1946, 77 y.o., female Today's Date: 12/30/2022  END OF SESSION:  PT End of Session - 12/30/22 1407     Visit Number 8    Number of Visits 16    Date for PT Re-Evaluation 01/13/23    Authorization Type [employer]    Authorization Time Period Cohere Approved 16 visits, 11/18/2022-01/13/2023-auth#[id number]    Progress Note Due on Visit 10    PT Start Time 1400    PT Stop Time 1445    PT Time Calculation (min) 45 min    Activity Tolerance Patient tolerated treatment well    Behavior During Therapy WFL for tasks assessed/performed              Past Medical History:  Diagnosis Date   GERD (gastroesophageal reflux disease)    TAKES PRILOSEC   Headache(784.0)    SINCE NECK  THING   High cholesterol    TAKES LIPITOR   Hypertension    PONV (postoperative nausea and vomiting)    NOTHING IN THE LAST COUPLE YRS   Rotator cuff tear, right    JUST FOUND OUT THIS WEEK   Past Surgical History:  Procedure Laterality Date   ABDOMINAL HYSTERECTOMY     ANTERIOR CERVICAL DECOMP/DISCECTOMY FUSION  10/01/2011   Procedure: ANTERIOR CERVICAL DECOMPRESSION/DISCECTOMY FUSION 2 LEVELS;  Surgeon: Jeanne Schwartz;  Location: [facility];  Service: Orthopedics;  Laterality: N/A;  C5-6, C6-7 Anterior Cervical Discectomy and Fusion, Allograft and Plate   BLEPHAROPLASTY     2001   BREAST BIOPSY Left 11/20/2019   BREAST EXCISIONAL BIOPSY Right    BREAST SURGERY     10 YRS  BENIGN   FRACTURE SURGERY     LEFT  WRIST   LEFT HEART CATH AND CORONARY ANGIOGRAPHY N/A 07/15/2020   Procedure: LEFT HEART CATH AND CORONARY ANGIOGRAPHY;  Surgeon: Jeanne Mormon, MD;  Location: [facility];  Service: Cardiovascular;  Laterality: N/A;   LEFT WRIST     METAL PLATE    SHOULDER SURGERY Right    arthroscopy and rotator cuff repair   TUBAL LIGATION     Patient Active Problem List    Diagnosis Date Noted   Bimalleolar ankle fracture, left, closed, initial encounter 09/08/2022   Exertional chest pain 07/14/2020   Abnormal stress test 07/14/2020   Trochanteric bursitis, left hip 09/18/2019   Impingement syndrome of left shoulder 06/06/2019   Cervical spondylosis 10/02/2011   ARTHRITIS 02/27/2008   GASTRITIS 01/16/2008   ABDOMINAL PAIN, EPIGASTRIC 01/15/2008    PCP: Jeanne Bene, PA-C   REFERRING PROVIDER: Marybelle Killings, MD  REFERRING DIAG: [phone] (ICD-10-CM) - Bimalleolar ankle fracture, left, closed, initial encounter  THERAPY DIAG:  Stiffness of left ankle, not elsewhere classified  Pain in left ankle and joints of left foot  Difficulty in walking, not elsewhere classified  Muscle weakness (generalized)  Cramp and spasm  Rationale for Evaluation and Treatment: Rehabilitation  ONSET DATE: 11/09/2022  SUBJECTIVE:   SUBJECTIVE STATEMENT: No complaints.  Walking more without cane at home.  Sometimes finds herself carrying her cane even when she is out and about.         PERTINENT HISTORY: Recent PCP note on 10-21-22 [name] is a pleasant 77 year old woman who is 5 weeks status post open reduction internal fixation of left bimalleolar ankle fracture with Dr. Lorin Schwartz. She is due to see him in the next week  or so. She comes in today because she feels like she has had some increased swelling in the ankle. She also is describing neuropathic type pain like something is scrubbing against her leg. She denies any particular particular calf pain she denies any fever or chills. She takes very little pain medication and has a family history of addiction so is very conscientious of this  Recent f/u note with Dr. Lorin Schwartz on 11-02-22 Assessment & Plan: Follow-up bimalleolar ankle fracture dislocation. X-rays today demonstrates progressive healing both sides. Incisions look good she needs more soap and water as well as lotion. Begin therapy weightbearing as  tolerated.  PAIN:  Are you having pain?  Yes 1/10   PRECAUTIONS: Fall  WEIGHT BEARING RESTRICTIONS: No  FALLS:  Has patient fallen in last 6 months? Yes. Number of falls 1 this incident , down the steps  LIVING ENVIRONMENT: Lives with: lives with their spouse Lives in: House/apartment Stairs: Yes: Internal: 12 steps; on left going up and External: 4 steps; none Has following equipment at home: Environmental consultant - 4 wheeled and Wheelchair (manual)  OCCUPATION: works from home  PLOF: Independent, Independent with basic ADLs, Independent with household mobility without device, Independent with community mobility without device, Independent with homemaking with ambulation, Independent with gait, and Independent with transfers  PATIENT GOALS: I'd like to walk unassisted without any a.d. or boot.    NEXT MD VISIT: 4/24  OBJECTIVE:   DIAGNOSTIC FINDINGS: bi-malleolar fracture with fixation  PATIENT SURVEYS:  FOTO 43 predicted 47  COGNITION: Overall cognitive status: Within functional limits for tasks assessed     SENSATION: WFL   LOWER EXTREMITY ROM:  Active ROM Right eval Left eval  Ankle dorsiflexion  22  Ankle plantarflexion  2  Ankle inversion  22  Ankle eversion  -4   (Blank rows = not tested)  LOWER EXTREMITY MMT:  Deferred due to acuteness  FUNCTIONAL TESTS:  5 times sit to stand: 12.55 sec Timed up and go (TUG): 34.00 sec  GAIT: Distance walked: 50 feet  Assistive device utilized: Environmental consultant - 2 wheeled Level of assistance: SBA Comments: Needs vc's for step through gait and avoiding scissoring, vc's to increase step width   TODAY'S TREATMENT:                                                                                                                              DATE:  3/21: Nustep x 5 min level 4 Standing on balance pad, alternating cone tap ups x 20 with min right UE support at barre Standing on balance pad, right foot tap up only x 20 with min right UE  support at barre Step up and hold on balance pad fwd x 10 (balancing on left for 2-3 sec) then lateral x 10 Gastroc stretch x 10 holding 10 sec each, soleus stretch x 10 holding 10 sec each on rockerboard propped at steps Standing rockerboard x 2 min DF/PF Cone touches with  cone in chair 3 x 10 left with cone on counter top Standing heel raises x 20 4 way ankle tband with red x 20 each: DF, PF, inversion and eversion PROM left ankle all planes    3/19: Gastroc stretch x 5 holding 10 sec each, soleus stretch x 5 holding 10 sec each Standing without UE support: right foot tap ups on bottom step x 10 Standing rockerboard x 2 min DF/PF Standing heel raises x 20 Gait training without A.D. x 200 feet with emphasis on heel strike, increasing step length and toe off on left. 4 way ankle tband with red x 20 each: DF, PF, inversion and eversion PROM left ankle all planes   3/14: Gait training with single point cane: emphasis on step through gait and proper sequence.  Also went over ascending and descending steps with cane with single handrail to simulate home. (Patient bought new cane) Gastroc stretch x 10 holding 10 sec each, soleus stretch x 10 holding 10 sec each Standing rockerboard x 2 min DF/PF Seated toe and heel raises x 20 DF stretch with slider seated with overpressure on knee (left) Sit to stand without UE support with normal stance and then in tandem with left LE slightly posterior x 5 each 3 way slider: uninvolved on slider x 10 (fwd, lateral, back) Cone tap up with right LE standing on left x 10 Stepping over hurdle and back with right LE x 10 Game ready x 10 min level 3 at mod compression   PATIENT EDUCATION:  Education details: Initiated HEP Person educated: Patient Education method: Consulting civil engineer, Media planner, Verbal cues, and Handouts Education comprehension: verbalized understanding, returned demonstration, and verbal cues required  HOME EXERCISE PROGRAM: Access Code:  [email or web address] URL: [email or web address] Date: 12/14/2022 Prepared by: [facility]  Exercises - Seated Ankle Alphabet  - 3 x daily - 7 x weekly - 1 sets - 1-2 reps - a-z hold - Seated Calf Stretch with Strap  - 3 x daily - 7 x weekly - 2 sets - 20-30 seconds hold - Seated Self Great Toe Stretch  - 3 x daily - 7 x weekly - 2 sets - 20-30 seconds hold - Isometric Ankle Eversion at Wall  - 1 x daily - 7 x weekly - 1-2 sets - 10 reps - 5 second hold - Isometric Ankle Inversion at Wall  - 1 x daily - 7 x weekly - 1-2 sets - 10 reps - 5 second hold - Seated Heel Toe Raises  - 2 x daily - 7 x weekly - 20 reps  ASSESSMENT:  CLINICAL IMPRESSION: Yatzary is progressing appropriately.  She is walking without assistive device at home.   ROM still limited in DF but swelling significantly reduced.  She is still wearing the larger shoe on the left.  She would benefit from continued skilled PT for left ankle ROM and stability training to restore patient to Schwartz level of function.    OBJECTIVE IMPAIRMENTS: Abnormal gait, decreased balance, decreased mobility, difficulty walking, decreased ROM, decreased strength, increased edema, increased fascial restrictions, increased muscle spasms, impaired flexibility, and pain.   ACTIVITY LIMITATIONS: carrying, lifting, bending, standing, squatting, sleeping, stairs, transfers, bathing, toileting, and dressing  PARTICIPATION LIMITATIONS: meal prep, cleaning, laundry, driving, shopping, community activity, yard work, and church  PERSONAL FACTORS: Age, Fitness, and 1-2 comorbidities: Htn, Right shoulder and left wrist past injuries  are also affecting patient's functional outcome.   REHAB POTENTIAL: Good  CLINICAL DECISION  MAKING: Stable/uncomplicated  EVALUATION COMPLEXITY: Low   GOALS: Goals reviewed with patient? Yes  SHORT TERM GOALS: Target date: 12/16/2022   Pain report to be no greater than 4/10   Baseline: Goal status: met 3/7  2.  Patient will be independent with initial HEP  Baseline:  Goal status: met 3/7  3.  Patient to ambulate vs using wheelchair at all times Baseline: WC majority of the time Goal status: met 3/7  LONG TERM GOALS: Target date: 01/13/2023   Pain report to be no greater than 2/10  Baseline:  Goal status: IN PROGRESS  2.  Patient will be independent with advanced HEP  Baseline:  Goal status: IN PROGRESS  3.  FOTO to improve to predicted outcome of 66 Baseline:  Goal status: INITIAL  4.  TUG and 5 times sit to stand to improve by 2-3 seconds Baseline:  Goal status: INITIAL  5.  Patient to report 85% improvement in overall symptoms  Baseline:  Goal status: INITIAL  6.  Patient to demonstrate normal heel to toe progression safely without a.d.  Baseline:  Goal status: INITIAL   PLAN:  PT FREQUENCY: 1-2x/week  PT DURATION: 8 weeks  PLANNED INTERVENTIONS: Therapeutic exercises, Therapeutic activity, Neuromuscular re-education, Balance training, Gait training, Patient/Family education, Self Care, Joint mobilization, Stair training, Orthotic/Fit training, DME instructions, Aquatic Therapy, Dry Needling, Electrical stimulation, Cryotherapy, Moist heat, Compression bandaging, scar mobilization, Splintting, Taping, Vasopneumatic device, Ionotophoresis 4mg /ml Dexamethasone, Manual therapy, and Re-evaluation  PLAN FOR NEXT SESSION: foot and ankle flexibility, incline stretching for gastroc and soleus; ankle isometrics and strength progression as ROM allows; gait training and standing tolerance; Nu-Step; vasocompression  Aliviah Spain B. Ayliana Casciano, PT 12/30/22 2:53 PM  [facility] [address], [place], [place] [address] Phone # [phone] Fax [phone]

## 2023-01-04 ENCOUNTER — Ambulatory Visit: Payer: Medicare HMO

## 2023-01-04 DIAGNOSIS — M25572 Pain in left ankle and joints of left foot: Secondary | ICD-10-CM

## 2023-01-04 DIAGNOSIS — M6281 Muscle weakness (generalized): Secondary | ICD-10-CM

## 2023-01-04 DIAGNOSIS — R252 Cramp and spasm: Secondary | ICD-10-CM

## 2023-01-04 DIAGNOSIS — M25672 Stiffness of left ankle, not elsewhere classified: Secondary | ICD-10-CM

## 2023-01-04 DIAGNOSIS — R262 Difficulty in walking, not elsewhere classified: Secondary | ICD-10-CM

## 2023-01-04 NOTE — Therapy (Signed)
OUTPATIENT PHYSICAL THERAPY LOWER EXTREMITY TREATMENT NOTE   Patient Name: Jeanne Schwartz MRN: [id number] DOB:1946/03/16, 77 y.o., female Today's Date: 01/04/2023  END OF SESSION:  PT End of Session - 01/04/23 1400     Visit Number 9    Number of Visits 16    Date for PT Re-Evaluation 01/13/23    Authorization Type [employer]    Authorization Time Period Cohere Approved 16 visits, 11/18/2022-01/13/2023-auth#[id number]    Progress Note Due on Visit 10    PT Start Time 1400    PT Stop Time 1445    PT Time Calculation (min) 45 min    Activity Tolerance Patient tolerated treatment well    Behavior During Therapy WFL for tasks assessed/performed              Past Medical History:  Diagnosis Date   GERD (gastroesophageal reflux disease)    TAKES PRILOSEC   Headache(784.0)    SINCE NECK  THING   High cholesterol    TAKES LIPITOR   Hypertension    PONV (postoperative nausea and vomiting)    NOTHING IN THE LAST COUPLE YRS   Rotator cuff tear, right    JUST FOUND OUT THIS WEEK   Past Surgical History:  Procedure Laterality Date   ABDOMINAL HYSTERECTOMY     ANTERIOR CERVICAL DECOMP/DISCECTOMY FUSION  10/01/2011   Procedure: ANTERIOR CERVICAL DECOMPRESSION/DISCECTOMY FUSION 2 LEVELS;  Surgeon: Marybelle Killings;  Location: [facility];  Service: Orthopedics;  Laterality: N/A;  C5-6, C6-7 Anterior Cervical Discectomy and Fusion, Allograft and Plate   BLEPHAROPLASTY     2001   BREAST BIOPSY Left 11/20/2019   BREAST EXCISIONAL BIOPSY Right    BREAST SURGERY     10 YRS  BENIGN   FRACTURE SURGERY     LEFT  WRIST   LEFT HEART CATH AND CORONARY ANGIOGRAPHY N/A 07/15/2020   Procedure: LEFT HEART CATH AND CORONARY ANGIOGRAPHY;  Surgeon: Nigel Mormon, MD;  Location: [facility];  Service: Cardiovascular;  Laterality: N/A;   LEFT WRIST     METAL PLATE    SHOULDER SURGERY Right    arthroscopy and rotator cuff repair   TUBAL LIGATION     Patient Active Problem List    Diagnosis Date Noted   Bimalleolar ankle fracture, left, closed, initial encounter 09/08/2022   Exertional chest pain 07/14/2020   Abnormal stress test 07/14/2020   Trochanteric bursitis, left hip 09/18/2019   Impingement syndrome of left shoulder 06/06/2019   Cervical spondylosis 10/02/2011   ARTHRITIS 02/27/2008   GASTRITIS 01/16/2008   ABDOMINAL PAIN, EPIGASTRIC 01/15/2008    PCP: Heywood Bene, PA-C   REFERRING PROVIDER: Marybelle Killings, MD  REFERRING DIAG: [phone] (ICD-10-CM) - Bimalleolar ankle fracture, left, closed, initial encounter  THERAPY DIAG:  Stiffness of left ankle, not elsewhere classified  Pain in left ankle and joints of left foot  Difficulty in walking, not elsewhere classified  Muscle weakness (generalized)  Cramp and spasm  Rationale for Evaluation and Treatment: Rehabilitation  ONSET DATE: 11/09/2022  SUBJECTIVE:   SUBJECTIVE STATEMENT: No complaints. No pain.  Not using cane at all.  Brings it with her in case needed for PT.  She is now wearing her normal shoe.           PERTINENT HISTORY: Recent PCP note on 10-21-22 Jeanne Schwartz is a pleasant 77 year old woman who is 5 weeks status post open reduction internal fixation of left bimalleolar ankle fracture with Dr. Lorin Mercy. She is due  to see him in the next week or so. She comes in today because she feels like she has had some increased swelling in the ankle. She also is describing neuropathic type pain like something is scrubbing against her leg. She denies any particular particular calf pain she denies any fever or chills. She takes very little pain medication and has a family history of addiction so is very conscientious of this  Recent f/u note with Dr. Lorin Mercy on 11-02-22 Assessment & Plan: Follow-up bimalleolar ankle fracture dislocation. X-rays today demonstrates progressive healing both sides. Incisions look good she needs more soap and water as well as lotion. Begin therapy weightbearing as  tolerated.  PAIN:  Are you having pain?  Yes 1/10   PRECAUTIONS: Fall  WEIGHT BEARING RESTRICTIONS: No  FALLS:  Has patient fallen in last 6 months? Yes. Number of falls 1 this incident , down the steps  LIVING ENVIRONMENT: Lives with: lives with their spouse Lives in: House/apartment Stairs: Yes: Internal: 12 steps; on left going up and External: 4 steps; none Has following equipment at home: Environmental consultant - 4 wheeled and Wheelchair (manual)  OCCUPATION: works from home  PLOF: Independent, Independent with basic ADLs, Independent with household mobility without device, Independent with community mobility without device, Independent with homemaking with ambulation, Independent with gait, and Independent with transfers  PATIENT GOALS: I'd like to walk unassisted without any a.d. or boot.    NEXT MD VISIT: 4/24  OBJECTIVE:   DIAGNOSTIC FINDINGS: bi-malleolar fracture with fixation  PATIENT SURVEYS:  FOTO 43 predicted 95  COGNITION: Overall cognitive status: Within functional limits for tasks assessed     SENSATION: WFL   LOWER EXTREMITY ROM:  Active ROM Right eval Left eval Left 01/04/23  Ankle plantarflexion  22 34  Ankle dorsiflexion  2 4  Ankle inversion  22 30  Ankle eversion  -4 10   (Blank rows = not tested)  LOWER EXTREMITY MMT:  Deferred due to acuteness  FUNCTIONAL TESTS:  5 times sit to stand: 12.55 sec Timed up and go (TUG): 34.00 sec  GAIT: Distance walked: 50 feet  Assistive device utilized: Environmental consultant - 2 wheeled Level of assistance: SBA Comments: Needs vc's for step through gait and avoiding scissoring, vc's to increase step width   TODAY'S TREATMENT:                                                                                                                              DATE:  3/21: Recumbent bike x 5 min level 1 Gastroc stretch x 10 holding 10 sec each, soleus stretch x 10 holding 10 sec each on rockerboard propped at steps Standing  rockerboard x 2 min DF/PF Standing heel raises x 20 Eccentric heel raises x 10 left Step up and hold on balance pad fwd x 10 (balancing on left for 2-3 sec) then lateral x 10 Standing on balance pad, alternating cone tap ups x 20 with min right  UE support at barre Cone touches with cone in chair 3 x 10 left with cone on counter top Remeasured ROM: See above PROM left ankle all planes    3/19: Gastroc stretch x 5 holding 10 sec each, soleus stretch x 5 holding 10 sec each Standing without UE support: right foot tap ups on bottom step x 10 Standing rockerboard x 2 min DF/PF Standing heel raises x 20 Gait training without A.D. x 200 feet with emphasis on heel strike, increasing step length and toe off on left. 4 way ankle tband with red x 20 each: DF, PF, inversion and eversion PROM left ankle all planes   3/14: Gait training with single point cane: emphasis on step through gait and proper sequence.  Also went over ascending and descending steps with cane with single handrail to simulate home. (Patient bought new cane) Gastroc stretch x 10 holding 10 sec each, soleus stretch x 10 holding 10 sec each Standing rockerboard x 2 min DF/PF Seated toe and heel raises x 20 DF stretch with slider seated with overpressure on knee (left) Sit to stand without UE support with normal stance and then in tandem with left LE slightly posterior x 5 each 3 way slider: uninvolved on slider x 10 (fwd, lateral, back) Cone tap up with right LE standing on left x 10 Stepping over hurdle and back with right LE x 10 Game ready x 10 min level 3 at mod compression   PATIENT EDUCATION:  Education details: Initiated HEP Person educated: Patient Education method: Consulting civil engineer, Media planner, Verbal cues, and Handouts Education comprehension: verbalized understanding, returned demonstration, and verbal cues required  HOME EXERCISE PROGRAM: Access Code: [email or web address] URL: [email or web address] Date:  12/14/2022 Prepared by: [facility]  Exercises - Seated Ankle Alphabet  - 3 x daily - 7 x weekly - 1 sets - 1-2 reps - a-z hold - Seated Calf Stretch with Strap  - 3 x daily - 7 x weekly - 2 sets - 20-30 seconds hold - Seated Self Great Toe Stretch  - 3 x daily - 7 x weekly - 2 sets - 20-30 seconds hold - Isometric Ankle Eversion at Wall  - 1 x daily - 7 x weekly - 1-2 sets - 10 reps - 5 second hold - Isometric Ankle Inversion at Wall  - 1 x daily - 7 x weekly - 1-2 sets - 10 reps - 5 second hold - Seated Heel Toe Raises  - 2 x daily - 7 x weekly - 20 reps  ASSESSMENT:  CLINICAL IMPRESSION: Jeanne Schwartz is showing excellent progress.  She is no longer using the cane. Gait is normalizing and edema is significantly reduced.  She is now able to wear her normal shoes size.  She should continue to improve.   She would benefit from continued skilled PT for left ankle ROM and stability training to restore patient to prior level of function.    OBJECTIVE IMPAIRMENTS: Abnormal gait, decreased balance, decreased mobility, difficulty walking, decreased ROM, decreased strength, increased edema, increased fascial restrictions, increased muscle spasms, impaired flexibility, and pain.   ACTIVITY LIMITATIONS: carrying, lifting, bending, standing, squatting, sleeping, stairs, transfers, bathing, toileting, and dressing  PARTICIPATION LIMITATIONS: meal prep, cleaning, laundry, driving, shopping, community activity, yard work, and church  PERSONAL FACTORS: Age, Fitness, and 1-2 comorbidities: Htn, Right shoulder and left wrist past injuries  are also affecting patient's functional outcome.   REHAB POTENTIAL: Good  CLINICAL DECISION MAKING: Stable/uncomplicated  EVALUATION  COMPLEXITY: Low   GOALS: Goals reviewed with patient? Yes  SHORT TERM GOALS: Target date: 12/16/2022   Pain report to be no greater than 4/10  Baseline: Goal status: met 3/7  2.  Patient  will be independent with initial HEP  Baseline:  Goal status: met 3/7  3.  Patient to ambulate vs using wheelchair at all times Baseline: WC majority of the time Goal status: met 3/7  LONG TERM GOALS: Target date: 01/13/2023   Pain report to be no greater than 2/10  Baseline:  Goal status: IN PROGRESS  2.  Patient will be independent with advanced HEP  Baseline:  Goal status: IN PROGRESS  3.  FOTO to improve to predicted outcome of 66 Baseline:  Goal status: INITIAL  4.  TUG and 5 times sit to stand to improve by 2-3 seconds Baseline:  Goal status: INITIAL  5.  Patient to report 85% improvement in overall symptoms  Baseline:  Goal status: INITIAL  6.  Patient to demonstrate normal heel to toe progression safely without a.d.  Baseline:  Goal status: INITIAL   PLAN:  PT FREQUENCY: 1-2x/week  PT DURATION: 8 weeks  PLANNED INTERVENTIONS: Therapeutic exercises, Therapeutic activity, Neuromuscular re-education, Balance training, Gait training, Patient/Family education, Self Care, Joint mobilization, Stair training, Orthotic/Fit training, DME instructions, Aquatic Therapy, Dry Needling, Electrical stimulation, Cryotherapy, Moist heat, Compression bandaging, scar mobilization, Splintting, Taping, Vasopneumatic device, Ionotophoresis 4mg /ml Dexamethasone, Manual therapy, and Re-evaluation  PLAN FOR NEXT SESSION: foot and ankle flexibility, incline stretching for gastroc and soleus; ankle isometrics and strength progression as ROM allows; gait training and standing tolerance; Nu-Step; vasocompression  Jahaira Earnhart B. Ember Henrikson, PT 01/04/23 2:47 PM  [facility] [address] [place], [place] [address] Phone # [phone] Fax [phone]

## 2023-01-05 ENCOUNTER — Other Ambulatory Visit: Payer: Self-pay | Admitting: Cardiology

## 2023-01-06 ENCOUNTER — Ambulatory Visit: Payer: Medicare HMO

## 2023-01-06 DIAGNOSIS — M6281 Muscle weakness (generalized): Secondary | ICD-10-CM

## 2023-01-06 DIAGNOSIS — M25572 Pain in left ankle and joints of left foot: Secondary | ICD-10-CM

## 2023-01-06 DIAGNOSIS — R252 Cramp and spasm: Secondary | ICD-10-CM

## 2023-01-06 DIAGNOSIS — M25672 Stiffness of left ankle, not elsewhere classified: Secondary | ICD-10-CM

## 2023-01-06 DIAGNOSIS — R262 Difficulty in walking, not elsewhere classified: Secondary | ICD-10-CM

## 2023-01-06 NOTE — Therapy (Signed)
OUTPATIENT PHYSICAL THERAPY LOWER EXTREMITY TREATMENT NOTE Progress Note Reporting Period 11/18/22 to 01/06/23  See note below for Objective Data and Assessment of Progress/Goals.       Patient Name: Jeanne Schwartz MRN: [id number] DOB:08/07/1946, 77 y.o., female Today's Date: 01/06/2023  END OF SESSION:  PT End of Session - 01/06/23 1357     Visit Number 10    Number of Visits 16    Date for PT Re-Evaluation 01/13/23    Authorization Type [employer]    Authorization Time Period Cohere Approved 16 visits, 11/18/2022-01/13/2023-auth#[id number]    Progress Note Due on Visit 90    PT Start Time 1359    PT Stop Time 1445    PT Time Calculation (min) 46 min    Activity Tolerance Patient tolerated treatment well    Behavior During Therapy WFL for tasks assessed/performed              Past Medical History:  Diagnosis Date   GERD (gastroesophageal reflux disease)    TAKES PRILOSEC   Headache(784.0)    SINCE NECK  THING   High cholesterol    TAKES LIPITOR   Hypertension    PONV (postoperative nausea and vomiting)    NOTHING IN THE LAST COUPLE YRS   Rotator cuff tear, right    JUST FOUND OUT THIS WEEK   Past Surgical History:  Procedure Laterality Date   ABDOMINAL HYSTERECTOMY     ANTERIOR CERVICAL DECOMP/DISCECTOMY FUSION  10/01/2011   Procedure: ANTERIOR CERVICAL DECOMPRESSION/DISCECTOMY FUSION 2 LEVELS;  Surgeon: Marybelle Killings;  Location: [facility];  Service: Orthopedics;  Laterality: N/A;  C5-6, C6-7 Anterior Cervical Discectomy and Fusion, Allograft and Plate   BLEPHAROPLASTY     2001   BREAST BIOPSY Left 11/20/2019   BREAST EXCISIONAL BIOPSY Right    BREAST SURGERY     10 YRS  BENIGN   FRACTURE SURGERY     LEFT  WRIST   LEFT HEART CATH AND CORONARY ANGIOGRAPHY N/A 07/15/2020   Procedure: LEFT HEART CATH AND CORONARY ANGIOGRAPHY;  Surgeon: Nigel Mormon, MD;  Location: [facility];  Service: Cardiovascular;  Laterality: N/A;   LEFT WRIST     METAL  PLATE    SHOULDER SURGERY Right    arthroscopy and rotator cuff repair   TUBAL LIGATION     Patient Active Problem List   Diagnosis Date Noted   Bimalleolar ankle fracture, left, closed, initial encounter 09/08/2022   Exertional chest pain 07/14/2020   Abnormal stress test 07/14/2020   Trochanteric bursitis, left hip 09/18/2019   Impingement syndrome of left shoulder 06/06/2019   Cervical spondylosis 10/02/2011   ARTHRITIS 02/27/2008   GASTRITIS 01/16/2008   ABDOMINAL PAIN, EPIGASTRIC 01/15/2008    PCP: Heywood Bene, PA-C   REFERRING PROVIDER: Marybelle Killings, MD  REFERRING DIAG: [phone] (ICD-10-CM) - Bimalleolar ankle fracture, left, closed, initial encounter  THERAPY DIAG:  Stiffness of left ankle, not elsewhere classified  Pain in left ankle and joints of left foot  Difficulty in walking, not elsewhere classified  Muscle weakness (generalized)  Cramp and spasm  Rationale for Evaluation and Treatment: Rehabilitation  ONSET DATE: 11/09/2022  SUBJECTIVE:   SUBJECTIVE STATEMENT: No complaints. No pain.  Doing better every day.           PERTINENT HISTORY: Recent PCP note on 10-21-22 Jeanne Schwartz is a pleasant 77 year old woman who is 5 weeks status post open reduction internal fixation of left bimalleolar ankle fracture with Dr. Lorin Mercy.  She is due to see him in the next week or so. She comes in today because she feels like she has had some increased swelling in the ankle. She also is describing neuropathic type pain like something is scrubbing against her leg. She denies any particular particular calf pain she denies any fever or chills. She takes very little pain medication and has a family history of addiction so is very conscientious of this  Recent f/u note with Dr. Lorin Mercy on 11-02-22 Assessment & Plan: Follow-up bimalleolar ankle fracture dislocation. X-rays today demonstrates progressive healing both sides. Incisions look good she needs more soap and water as well  as lotion. Begin therapy weightbearing as tolerated.  PAIN:  Are you having pain? No  PRECAUTIONS: Fall  WEIGHT BEARING RESTRICTIONS: No  FALLS:  Has patient fallen in last 6 months? Yes. Number of falls 1 this incident , down the steps  LIVING ENVIRONMENT: Lives with: lives with their spouse Lives in: House/apartment Stairs: Yes: Internal: 12 steps; on left going up and External: 4 steps; none Has following equipment at home: Environmental consultant - 4 wheeled and Wheelchair (manual)  OCCUPATION: works from home  PLOF: Independent, Independent with basic ADLs, Independent with household mobility without device, Independent with community mobility without device, Independent with homemaking with ambulation, Independent with gait, and Independent with transfers  PATIENT GOALS: I'd like to walk unassisted without any a.d. or boot.    NEXT MD VISIT: 4/24  OBJECTIVE:   DIAGNOSTIC FINDINGS: bi-malleolar fracture with fixation  PATIENT SURVEYS:  FOTO 43 predicted 67 01/06/23: 46  COGNITION: Overall cognitive status: Within functional limits for tasks assessed     SENSATION: WFL   LOWER EXTREMITY ROM:  Active ROM Right eval Left eval Left 01/04/23  Ankle plantarflexion 42 22 34  Ankle dorsiflexion 22 2 4   Ankle inversion 34 22 30  Ankle eversion 20 -4 10   (Blank rows = not tested)  LOWER EXTREMITY MMT:  Deferred due to acuteness  FUNCTIONAL TESTS:  Initial eval: 5 times sit to stand: 12.55 sec Timed up and go (TUG): 34.00 sec  with walker  01/06/23: 5 times sit to stand: 9.72 sec Timed up and go (TUG): 9.52 sec no walker  GAIT: Distance walked: 300 feet  Assistive device utilized: None Level of assistance: Complete Independence Comments: Beginning to walk with step through gait and good heel to toe progression,  DF ROM still limited therefore toe off is limited.   TODAY'S TREATMENT:                                                                                                                               DATE:  3/28: 10th visit progress note completed Recumbent bike x 5 min level 3 Gastroc stretch x 10 holding 10 sec each, soleus stretch x 10 holding 10 sec each on rockerboard propped at steps Standing rockerboard x 2 min DF/PF Seated DF stretch with foot on floor,  10 rocks fwd and back x 10 DF stretch with left foot in chair, lunging fwd with emphasis on keeping heel down Standing heel raises x 20 Eccentric heel raises x 10 left Inversion stretch on half roll PROM left ankle all planes   3/21: Recumbent bike x 5 min level 1 Gastroc stretch x 10 holding 10 sec each, soleus stretch x 10 holding 10 sec each on rockerboard propped at steps Standing rockerboard x 2 min DF/PF Standing heel raises x 20 Eccentric heel raises x 10 left Step up and hold on balance pad fwd x 10 (balancing on left for 2-3 sec) then lateral x 10 Standing on balance pad, alternating cone tap ups x 20 with min right UE support at barre Cone touches with cone in chair 3 x 10 left with cone on counter top Remeasured ROM: See above PROM left ankle all planes   3/19: Gastroc stretch x 5 holding 10 sec each, soleus stretch x 5 holding 10 sec each Standing without UE support: right foot tap ups on bottom step x 10 Standing rockerboard x 2 min DF/PF Standing heel raises x 20 Gait training without A.D. x 200 feet with emphasis on heel strike, increasing step length and toe off on left. 4 way ankle tband with red x 20 each: DF, PF, inversion and eversion PROM left ankle all planes   PATIENT EDUCATION:  Education details: Initiated HEP Person educated: Patient Education method: Consulting civil engineer, Media planner, Verbal cues, and Handouts Education comprehension: verbalized understanding, returned demonstration, and verbal cues required  HOME EXERCISE PROGRAM: Access Code: [email or web address] URL: [email or web address] Date: 12/14/2022 Prepared by: [facility]  Exercises - Seated Ankle Alphabet  - 3 x daily - 7 x weekly - 1 sets - 1-2 reps - a-z hold - Seated Calf Stretch with Strap  - 3 x daily - 7 x weekly - 2 sets - 20-30 seconds hold - Seated Self Great Toe Stretch  - 3 x daily - 7 x weekly - 2 sets - 20-30 seconds hold - Isometric Ankle Eversion at Wall  - 1 x daily - 7 x weekly - 1-2 sets - 10 reps - 5 second hold - Isometric Ankle Inversion at Wall  - 1 x daily - 7 x weekly - 1-2 sets - 10 reps - 5 second hold - Seated Heel Toe Raises  - 2 x daily - 7 x weekly - 20 reps  ASSESSMENT:  CLINICAL IMPRESSION: Mattia is progressing toward final goals.  All objective findings are greatly improved.  She is ambulating independently without an assistive device beginning to demonstrate good heel to toe progression.  She has very little pain to speak of other than with higher level activities such as toe raises.  She is well motivated and compliant.  She should continue to improve.    She would benefit from continued skilled PT for left ankle ROM and stability training to restore patient to prior level of function.    OBJECTIVE IMPAIRMENTS: Abnormal gait, decreased balance, decreased mobility, difficulty walking, decreased ROM, decreased strength, increased edema, increased fascial restrictions, increased muscle spasms, impaired flexibility, and pain.   ACTIVITY LIMITATIONS: carrying, lifting, bending, standing, squatting, sleeping, stairs, transfers, bathing, toileting, and dressing  PARTICIPATION LIMITATIONS: meal prep, cleaning, laundry, driving, shopping, community activity, yard work, and church  PERSONAL FACTORS: Age, Fitness, and 1-2 comorbidities: Htn, Right shoulder and left wrist past injuries  are also affecting patient's functional outcome.   REHAB  POTENTIAL: Good  CLINICAL DECISION MAKING: Stable/uncomplicated  EVALUATION COMPLEXITY: Low   GOALS: Goals reviewed with patient? Yes  SHORT TERM GOALS:  Target date: 12/16/2022   Pain report to be no greater than 4/10  Baseline: Goal status: met 3/7  2.  Patient will be independent with initial HEP  Baseline:  Goal status: met 3/7  3.  Patient to ambulate vs using wheelchair at all times Baseline: WC majority of the time Goal status: met 3/7  LONG TERM GOALS: Target date: 01/13/2023   Pain report to be no greater than 2/10  Baseline:  Goal status: MET  2.  Patient will be independent with advanced HEP  Baseline:  Goal status: IN PROGRESS  3.  FOTO to improve to predicted outcome of 66 Baseline:  Goal status: MET  4.  TUG and 5 times sit to stand to improve by 2-3 seconds Baseline:  Goal status: MET  5.  Patient to report 85% improvement in overall symptoms  Baseline:  Goal status: IN PROGRESS  6.  Patient to demonstrate normal heel to toe progression safely without a.d.  Baseline:  Goal status: IN PROGRESS   PLAN:  PT FREQUENCY: 1-2x/week  PT DURATION: 8 weeks  PLANNED INTERVENTIONS: Therapeutic exercises, Therapeutic activity, Neuromuscular re-education, Balance training, Gait training, Patient/Family education, Self Care, Joint mobilization, Stair training, Orthotic/Fit training, DME instructions, Aquatic Therapy, Dry Needling, Electrical stimulation, Cryotherapy, Moist heat, Compression bandaging, scar mobilization, Splintting, Taping, Vasopneumatic device, Ionotophoresis 4mg /ml Dexamethasone, Manual therapy, and Re-evaluation  PLAN FOR NEXT SESSION: foot and ankle flexibility, incline stretching for gastroc and soleus;  strength progression as ROM allows; gait training and standing tolerance; Nu-Step   Anderson Malta B. Shamira Toutant, PT 01/06/23 6:06 PM  [facility] [address] [place], [place] [address] Phone # [phone] Fax [phone]

## 2023-01-11 ENCOUNTER — Ambulatory Visit: Payer: Medicare HMO | Attending: Orthopaedic Surgery

## 2023-01-11 DIAGNOSIS — R252 Cramp and spasm: Secondary | ICD-10-CM | POA: Insufficient documentation

## 2023-01-11 DIAGNOSIS — R262 Difficulty in walking, not elsewhere classified: Secondary | ICD-10-CM

## 2023-01-11 DIAGNOSIS — M6281 Muscle weakness (generalized): Secondary | ICD-10-CM | POA: Diagnosis present

## 2023-01-11 DIAGNOSIS — M25672 Stiffness of left ankle, not elsewhere classified: Secondary | ICD-10-CM

## 2023-01-11 DIAGNOSIS — M25572 Pain in left ankle and joints of left foot: Secondary | ICD-10-CM | POA: Insufficient documentation

## 2023-01-11 NOTE — Therapy (Signed)
OUTPATIENT PHYSICAL THERAPY LOWER EXTREMITY TREATMENT NOTE Patient Name: Jeanne Schwartz MRN: [id number] DOB:July 05, 1946, 77 y.o., female Today's Date: 01/11/2023  END OF SESSION:  PT End of Session - 01/11/23 1400     Visit Number 11    Number of Visits 16    Date for PT Re-Evaluation 01/13/23    Authorization Type [employer]    Authorization Time Period [place] Approved 16 visits, 11/18/2022-01/13/2023-auth#[id number]    Progress Note Due on Visit 20    PT Start Time 1400    PT Stop Time 1445    PT Time Calculation (min) 45 min    Activity Tolerance Patient tolerated treatment well    Behavior During Therapy WFL for tasks assessed/performed              Past Medical History:  Diagnosis Date   GERD (gastroesophageal reflux disease)    TAKES PRILOSEC   Headache(784.0)    SINCE NECK  THING   High cholesterol    TAKES LIPITOR   Hypertension    PONV (postoperative nausea and vomiting)    NOTHING IN THE LAST COUPLE YRS   Rotator cuff tear, right    JUST FOUND OUT THIS WEEK   Past Surgical History:  Procedure Laterality Date   ABDOMINAL HYSTERECTOMY     ANTERIOR CERVICAL DECOMP/DISCECTOMY FUSION  10/01/2011   Procedure: ANTERIOR CERVICAL DECOMPRESSION/DISCECTOMY FUSION 2 LEVELS;  Surgeon: Marybelle Killings;  Location: [facility];  Service: Orthopedics;  Laterality: N/A;  C5-6, C6-7 Anterior Cervical Discectomy and Fusion, Allograft and Plate   BLEPHAROPLASTY     2001   BREAST BIOPSY Left 11/20/2019   BREAST EXCISIONAL BIOPSY Right    BREAST SURGERY     10 YRS  BENIGN   FRACTURE SURGERY     LEFT  WRIST   LEFT HEART CATH AND CORONARY ANGIOGRAPHY N/A 07/15/2020   Procedure: LEFT HEART CATH AND CORONARY ANGIOGRAPHY;  Surgeon: Nigel Mormon, MD;  Location: [facility];  Service: Cardiovascular;  Laterality: N/A;   LEFT WRIST     METAL PLATE    SHOULDER SURGERY Right    arthroscopy and rotator cuff repair   TUBAL LIGATION     Patient Active Problem List   Diagnosis  Date Noted   Bimalleolar ankle fracture, left, closed, initial encounter 09/08/2022   Exertional chest pain 07/14/2020   Abnormal stress test 07/14/2020   Trochanteric bursitis, left hip 09/18/2019   Impingement syndrome of left shoulder 06/06/2019   Cervical spondylosis 10/02/2011   ARTHRITIS 02/27/2008   GASTRITIS 01/16/2008   ABDOMINAL PAIN, EPIGASTRIC 01/15/2008    PCP: Heywood Bene, PA-C   REFERRING PROVIDER: Marybelle Killings, MD  REFERRING DIAG: [phone] (ICD-10-CM) - Bimalleolar ankle fracture, left, closed, initial encounter  THERAPY DIAG:  Stiffness of left ankle, not elsewhere classified  Pain in left ankle and joints of left foot  Difficulty in walking, not elsewhere classified  Muscle weakness (generalized)  Cramp and spasm  Rationale for Evaluation and Treatment: Rehabilitation  ONSET DATE: 11/09/2022  SUBJECTIVE:   SUBJECTIVE STATEMENT: No complaints. No pain.             PERTINENT HISTORY: Recent PCP note on 10-21-22 Jeanne Schwartz is a pleasant 77 year old woman who is 5 weeks status post open reduction internal fixation of left bimalleolar ankle fracture with Dr. Lorin Mercy. She is due to see him in the next week or so. She comes in today because she feels like she has had some increased swelling in  the ankle. She also is describing neuropathic type pain like something is scrubbing against her leg. She denies any particular particular calf pain she denies any fever or chills. She takes very little pain medication and has a family history of addiction so is very conscientious of this  Recent f/u note with Dr. Lorin Mercy on 11-02-22 Assessment & Plan: Follow-up bimalleolar ankle fracture dislocation. X-rays today demonstrates progressive healing both sides. Incisions look good she needs more soap and water as well as lotion. Begin therapy weightbearing as tolerated.  PAIN:  Are you having pain? No  PRECAUTIONS: Fall  WEIGHT BEARING RESTRICTIONS: No  FALLS:  Has  patient fallen in last 6 months? Yes. Number of falls 1 this incident , down the steps  LIVING ENVIRONMENT: Lives with: lives with their spouse Lives in: House/apartment Stairs: Yes: Internal: 12 steps; on left going up and External: 4 steps; none Has following equipment at home: Environmental consultant - 4 wheeled and Wheelchair (manual)  OCCUPATION: works from home  PLOF: Independent, Independent with basic ADLs, Independent with household mobility without device, Independent with community mobility without device, Independent with homemaking with ambulation, Independent with gait, and Independent with transfers  PATIENT GOALS: I'd like to walk unassisted without any a.d. or boot.    NEXT MD VISIT: 4/24  OBJECTIVE:   DIAGNOSTIC FINDINGS: bi-malleolar fracture with fixation  PATIENT SURVEYS:  FOTO 43 predicted 67 01/06/23: 70  COGNITION: Overall cognitive status: Within functional limits for tasks assessed     SENSATION: WFL   LOWER EXTREMITY ROM:  Active ROM Right eval Left eval Left 01/04/23  Ankle plantarflexion 42 22 34  Ankle dorsiflexion 22 2 4   Ankle inversion 34 22 30  Ankle eversion 20 -4 10   (Blank rows = not tested)  LOWER EXTREMITY MMT:  Deferred due to acuteness  FUNCTIONAL TESTS:  Initial eval: 5 times sit to stand: 12.55 sec Timed up and go (TUG): 34.00 sec  with walker  01/06/23: 5 times sit to stand: 9.72 sec Timed up and go (TUG): 9.52 sec no walker  GAIT: Distance walked: 300 feet  Assistive device utilized: None Level of assistance: Complete Independence Comments: Beginning to walk with step through gait and good heel to toe progression,  DF ROM still limited therefore toe off is limited.   TODAY'S TREATMENT:                                                                                                                              DATE:  4/2: Recumbent bike x 5 min level 3 Gastroc stretch x 10 holding 10 sec each, soleus stretch x 10 holding 10  sec each on rockerboard propped at steps DF stretch with left foot in chair, lunging fwd with emphasis on keeping heel down Standing rockerboard x 2 min DF/PF Standing heel raises x 20 Eccentric heel raises x 10 left Step downs: fwd x 10, lateral x 10 on 4" step Inversion stretch  on half roll x 10 hold 10 seconds PROM left ankle all planes   3/28: 10th visit progress note completed Recumbent bike x 5 min level 3 Gastroc stretch x 10 holding 10 sec each, soleus stretch x 10 holding 10 sec each on rockerboard propped at steps Standing rockerboard x 2 min DF/PF Seated DF stretch with foot on floor, 10 rocks fwd and back x 10 DF stretch with left foot in chair, lunging fwd with emphasis on keeping heel down Standing heel raises x 20 Eccentric heel raises x 10 left Inversion stretch on half roll PROM left ankle all planes   3/21: Recumbent bike x 5 min level 1 Gastroc stretch x 10 holding 10 sec each, soleus stretch x 10 holding 10 sec each on rockerboard propped at steps Standing rockerboard x 2 min DF/PF Standing heel raises x 20 Eccentric heel raises x 10 left Step up and hold on balance pad fwd x 10 (balancing on left for 2-3 sec) then lateral x 10 Standing on balance pad, alternating cone tap ups x 20 with min right UE support at barre Cone touches with cone in chair 3 x 10 left with cone on counter top Remeasured ROM: See above PROM left ankle all planes   PATIENT EDUCATION:  Education details: Initiated HEP Person educated: Patient Education method: Consulting civil engineer, Media planner, Verbal cues, and Handouts Education comprehension: verbalized understanding, returned demonstration, and verbal cues required  HOME EXERCISE PROGRAM: Access Code: [email or web address] URL: [email or web address] Date: 12/14/2022 Prepared by: [facility]  Exercises - Seated Ankle Alphabet  - 3 x daily - 7 x weekly - 1 sets - 1-2 reps - a-z hold -  Seated Calf Stretch with Strap  - 3 x daily - 7 x weekly - 2 sets - 20-30 seconds hold - Seated Self Great Toe Stretch  - 3 x daily - 7 x weekly - 2 sets - 20-30 seconds hold - Isometric Ankle Eversion at Wall  - 1 x daily - 7 x weekly - 1-2 sets - 10 reps - 5 second hold - Isometric Ankle Inversion at Wall  - 1 x daily - 7 x weekly - 1-2 sets - 10 reps - 5 second hold - Seated Heel Toe Raises  - 2 x daily - 7 x weekly - 20 reps  ASSESSMENT:  CLINICAL IMPRESSION: Elynore is progressing toward final goals.  All objective findings are greatly improved.  She is ambulating independently without an assistive device beginning to demonstrate good heel to toe progression.  She has very little pain to speak of other than with higher level activities such as toe raises.  She is well motivated and compliant.  She should continue to improve.  We did 10th visit note last visit with all progress noted under objective findings above.  She has f/u with MD on 01/14/23.   She will have 4 more visits left after her appt on Thursday but the date ended.  We will request date extension from insurance and continue unless otherwise indicated by MD.   She would benefit from continued skilled PT for left ankle ROM and stability training to restore patient to prior level of function.    OBJECTIVE IMPAIRMENTS: Abnormal gait, decreased balance, decreased mobility, difficulty walking, decreased ROM, decreased strength, increased edema, increased fascial restrictions, increased muscle spasms, impaired flexibility, and pain.   ACTIVITY LIMITATIONS: carrying, lifting, bending, standing, squatting, sleeping, stairs, transfers, bathing, toileting, and dressing  PARTICIPATION LIMITATIONS: meal prep,  cleaning, laundry, driving, shopping, community activity, yard work, and church  PERSONAL FACTORS: Age, Fitness, and 1-2 comorbidities: Htn, Right shoulder and left wrist past injuries  are also affecting patient's functional outcome.    REHAB POTENTIAL: Good  CLINICAL DECISION MAKING: Stable/uncomplicated  EVALUATION COMPLEXITY: Low   GOALS: Goals reviewed with patient? Yes  SHORT TERM GOALS: Target date: 12/16/2022   Pain report to be no greater than 4/10  Baseline: Goal status: met 3/7  2.  Patient will be independent with initial HEP  Baseline:  Goal status: met 3/7  3.  Patient to ambulate vs using wheelchair at all times Baseline: WC majority of the time Goal status: met 3/7  LONG TERM GOALS: Target date: 01/13/2023   Pain report to be no greater than 2/10  Baseline:  Goal status: MET  2.  Patient will be independent with advanced HEP  Baseline:  Goal status: IN PROGRESS  3.  FOTO to improve to predicted outcome of 66 Baseline:  Goal status: MET  4.  TUG and 5 times sit to stand to improve by 2-3 seconds Baseline:  Goal status: MET  5.  Patient to report 85% improvement in overall symptoms  Baseline:  Goal status: IN PROGRESS  6.  Patient to demonstrate normal heel to toe progression safely without a.d.  Baseline:  Goal status: IN PROGRESS   PLAN:  PT FREQUENCY: 1-2x/week  PT DURATION: 8 weeks  PLANNED INTERVENTIONS: Therapeutic exercises, Therapeutic activity, Neuromuscular re-education, Balance training, Gait training, Patient/Family education, Self Care, Joint mobilization, Stair training, Orthotic/Fit training, DME instructions, Aquatic Therapy, Dry Needling, Electrical stimulation, Cryotherapy, Moist heat, Compression bandaging, scar mobilization, Splintting, Taping, Vasopneumatic device, Ionotophoresis 4mg /ml Dexamethasone, Manual therapy, and Re-evaluation  PLAN FOR NEXT SESSION: foot and ankle flexibility, incline stretching for gastroc and soleus;  strength progression as ROM allows; gait training and standing tolerance; Nu-Step   Anderson Malta B. Aliayah Tyer, PT 01/11/23 11:36 PM  [facility] [address] [place],  [address] Phone #  [phone] Fax [phone]

## 2023-01-13 ENCOUNTER — Ambulatory Visit: Payer: Medicare HMO

## 2023-01-13 DIAGNOSIS — M25672 Stiffness of left ankle, not elsewhere classified: Secondary | ICD-10-CM | POA: Diagnosis not present

## 2023-01-13 DIAGNOSIS — M25572 Pain in left ankle and joints of left foot: Secondary | ICD-10-CM

## 2023-01-13 DIAGNOSIS — R252 Cramp and spasm: Secondary | ICD-10-CM

## 2023-01-13 DIAGNOSIS — M6281 Muscle weakness (generalized): Secondary | ICD-10-CM

## 2023-01-13 DIAGNOSIS — R262 Difficulty in walking, not elsewhere classified: Secondary | ICD-10-CM

## 2023-01-13 NOTE — Therapy (Addendum)
 OUTPATIENT PHYSICAL THERAPY LOWER EXTREMITY TREATMENT NOTE PHYSICAL THERAPY DISCHARGE SUMMARY  Visits from Start of Care: 12  Current functional level related to goals / functional outcomes: See below   Remaining deficits: See below   Education / Equipment: See below   Patient agrees to discharge. Patient goals were partially met. Patient is being discharged due to not returning since the last visit.  Patient Name: Jeanne Schwartz MRN: [id number] DOB:[birth year], 77 y.o., female Today's Date: 03/08/2024  END OF SESSION:     Past Medical History:  Diagnosis Date   Chronic kidney disease    GERD (gastroesophageal reflux disease)    TAKES PRILOSEC   Headache(784.0)    SINCE NECK  THING   High cholesterol    TAKES LIPITOR   Hypertension    PONV (postoperative nausea and vomiting)    NOTHING IN THE LAST COUPLE YRS   Rotator cuff tear, right    JUST FOUND OUT THIS WEEK   Past Surgical History:  Procedure Laterality Date   ABDOMINAL HYSTERECTOMY     ANTERIOR CERVICAL DECOMP/DISCECTOMY FUSION  10/01/2011   Procedure: ANTERIOR CERVICAL DECOMPRESSION/DISCECTOMY FUSION 2 LEVELS;  Surgeon: Jeanne Schwartz;  [facility]: [facility];  Service: Orthopedics;  Laterality: N/A;  C5-6, C6-7 Anterior Cervical Discectomy and Fusion, Allograft and Plate   BLEPHAROPLASTY     2001   BREAST BIOPSY Left 11/20/2019   BREAST EXCISIONAL BIOPSY Right    BREAST SURGERY     10 YRS  BENIGN   FRACTURE SURGERY     LEFT  WRIST   LEFT HEART CATH AND CORONARY ANGIOGRAPHY N/A 07/15/2020   Procedure: LEFT HEART CATH AND CORONARY ANGIOGRAPHY;  Surgeon: Jeanne Das, MD;  [facility]: [facility];  Service: Cardiovascular;  Laterality: N/A;   LEFT WRIST     METAL PLATE    ROBOTIC ASSISTED LAPAROSCOPIC LYSIS OF ADHESION N/A 01/16/2024   Procedure: LYSIS, ADHESIONS, ROBOT-ASSISTED, LAPAROSCOPIC;  Surgeon: Jeanne Shaver, MD;  Location: [facility];  Service: Gynecology;  Laterality: N/A;   ROBOTIC  ASSISTED SALPINGO OOPHERECTOMY Right 01/16/2024   Procedure: ROBOTIC ASSISTED SALPINGO OOPHORECTOMY;  Surgeon: Jeanne Shaver, MD;  Location: [facility];  Service: Gynecology;  Laterality: Right;  and CPT S2900   SHOULDER SURGERY Right    arthroscopy and rotator cuff repair   TUBAL LIGATION     Patient Active Problem List   Diagnosis Date Noted   Anal fissure 01/09/2024   Colon cancer screening 01/09/2024   Constipation 01/09/2024   History of colonic polyps 01/09/2024   Rectal pain 01/09/2024   H/O screening mammography 01/09/2024   Age-related osteoporosis without current pathological fracture 05/26/2023   Essential (primary) hypertension 05/26/2023   Mixed hyperlipidemia 05/26/2023   Posterior tibial tendonitis 02/03/2023   Bimalleolar ankle fracture, left, closed,  sequela 09/08/2022   Pain in throat 07/08/2022   Diverticular disease of colon 02/10/2021   Dysphagia 02/10/2021   Vomiting without nausea 02/10/2021   Exertional chest pain 07/14/2020   Abnormal stress test 07/14/2020   Pharyngeal dysphagia 05/15/2020   Spastic dysphonia 05/15/2020   Osteopenia 04/23/2020   Impingement syndrome of left shoulder 06/06/2019   Dizziness 03/16/2019   Sensorineural hearing loss (SNHL), bilateral 03/16/2019   Bursitis of left hip 03/12/2019   Elevated blood pressure reading 10/21/2018   CKD (chronic kidney disease) stage 3, GFR 30-59 ml/min (HCC) 04/26/2018   Cough 05/29/2016   Cervical spondylosis 10/02/2011   Rotator cuff tear, right 10/02/2011   Arthropathy 02/27/2008   Gastro-esophageal  reflux disease without esophagitis 01/16/2008   Epigastric pain 01/15/2008    PCP: Jeanne Mirza, PA-C   REFERRING PROVIDER: Adah Acron, MD  REFERRING DIAG: [phone] (ICD-10-CM) - Bimalleolar ankle fracture, left, closed, initial encounter  THERAPY DIAG:  Stiffness of left ankle, not elsewhere classified  Pain in left ankle and joints of left foot  Difficulty in walking,  not elsewhere classified  Muscle weakness (generalized)  Cramp and spasm  Rationale for Evaluation and Treatment: Rehabilitation  ONSET DATE: 11/09/2022  SUBJECTIVE:   SUBJECTIVE STATEMENT: No complaints. No pain.   Sees MD tomorrow.          PERTINENT HISTORY: Recent PCP note on 10-21-22 Jeanne Schwartz is a pleasant 77 year old woman who is 5 weeks status post open reduction internal fixation of left bimalleolar ankle fracture with Dr. Murrel Schwartz. She is due to see him in the next week or so. She comes in today because she feels like she has had some increased swelling in the ankle. She also is describing neuropathic type pain like something is scrubbing against her leg. She denies any particular particular calf pain she denies any fever or chills. She takes very little pain medication and has a family history of addiction so is very conscientious of this  Recent f/u note with Dr. Murrel Schwartz on 11-02-22 Assessment & Plan: Follow-up bimalleolar ankle fracture dislocation. X-rays today demonstrates progressive healing both sides. Incisions look good she needs more soap and water as well as lotion. Begin therapy weightbearing as tolerated.  PAIN:  Are you having pain? No  PRECAUTIONS: Fall  WEIGHT BEARING RESTRICTIONS: No  FALLS:  Has patient fallen in last 6 months? Yes. Number of falls 1 this incident , down the steps  LIVING ENVIRONMENT: Lives with: lives with their spouse Lives in: House/apartment Stairs: Yes: Internal: 12 steps; on left going up and External: 4 steps; none Has following equipment at home: Environmental consultant - 4 wheeled and Wheelchair (manual)  OCCUPATION: works from home  PLOF: Independent, Independent with basic ADLs, Independent with household mobility without device, Independent with community mobility without device, Independent with homemaking with ambulation, Independent with gait, and Independent with transfers  PATIENT GOALS: I'd like to walk unassisted without any a.d. or boot.     NEXT MD VISIT: 4/24  OBJECTIVE:   DIAGNOSTIC FINDINGS: bi-malleolar fracture with fixation  PATIENT SURVEYS:  FOTO 43 predicted 67 01/06/23: 67  COGNITION: Overall cognitive status: Within functional limits for tasks assessed     SENSATION: WFL   LOWER EXTREMITY ROM:  Active ROM Right eval Left eval Left 01/04/23  Ankle plantarflexion 42 22 34  Ankle dorsiflexion 22 2 4   Ankle inversion 34 22 30  Ankle eversion 20 -4 10   (Blank rows = not tested)  LOWER EXTREMITY MMT:  Deferred due to acuteness  FUNCTIONAL TESTS:  Initial eval: 5 times sit to stand: 12.55 sec Timed up and go (TUG): 34.00 sec with walker  01/06/23: 5 times sit to stand: 9.72 sec Timed up and go (TUG): 9.52 sec no walker  GAIT: Distance walked: 300 feet  Assistive device utilized: None Level of assistance: Complete Independence Comments: Beginning to walk with step through gait and good heel to toe progression,  DF ROM still limited therefore toe off is limited.   TODAY'S TREATMENT:  DATE:  4/4: Recumbent bike x 5 min level 3 Prostretch for left LE x 10 holding 10 sec each Gastroc stretch x 10 holding 10 sec each, soleus stretch x 10 holding 10 sec each on rockerboard propped at steps Eccentric heel raises x 10 left Standing rockerboard x 2 min DF/PF DF stretch with left foot in chair, lunging fwd with emphasis on keeping heel down  x 10 rocks Standing heel raises x 20 Inversion stretch on half roll x 10 hold 10 seconds Step downs: fwd x 10, lateral x 10 on 6" step (patient could not quite reach floor with right heel on 6") PROM left ankle all planes   4/2: Recumbent bike x 5 min level 3 Gastroc stretch x 10 holding 10 sec each, soleus stretch x 10 holding 10 sec each on rockerboard propped at steps DF stretch with left foot in chair, lunging fwd with emphasis  on keeping heel down Standing rockerboard x 2 min DF/PF Standing heel raises x 20 Eccentric heel raises x 10 left Step downs: fwd x 10, lateral x 10 on 4" step Inversion stretch on half roll x 10 hold 10 seconds PROM left ankle all planes   3/28: 10th visit progress note completed Recumbent bike x 5 min level 3 Gastroc stretch x 10 holding 10 sec each, soleus stretch x 10 holding 10 sec each on rockerboard propped at steps Standing rockerboard x 2 min DF/PF Seated DF stretch with foot on floor, 10 rocks fwd and back x 10 DF stretch with left foot in chair, lunging fwd with emphasis on keeping heel down Standing heel raises x 20 Eccentric heel raises x 10 left Inversion stretch on half roll PROM left ankle all planes   PATIENT EDUCATION:  Education details: Initiated HEP Person educated: Patient Education method: Programmer, multimedia, Facilities manager, Verbal cues, and Handouts Education comprehension: verbalized understanding, returned demonstration, and verbal cues required  HOME EXERCISE PROGRAM: Access Code: [email or web address] URL: [email or web address] Date: 12/14/2022 Prepared by: [facility] - [facility]  Exercises - Seated Ankle Alphabet  - 3 x daily - 7 x weekly - 1 sets - 1-2 reps - a-z hold - Seated Calf Stretch with Strap  - 3 x daily - 7 x weekly - 2 sets - 20-30 seconds hold - Seated Self Great Toe Stretch  - 3 x daily - 7 x weekly - 2 sets - 20-30 seconds hold - Isometric Ankle Eversion at Wall  - 1 x daily - 7 x weekly - 1-2 sets - 10 reps - 5 second hold - Isometric Ankle Inversion at Wall  - 1 x daily - 7 x weekly - 1-2 sets - 10 reps - 5 second hold - Seated Heel Toe Raises  - 2 x daily - 7 x weekly - 20 reps  ASSESSMENT:  CLINICAL IMPRESSION: Arcelia continues to  progress toward final goals.  She is ambulating independently without an assistive device and demonstrates good heel to toe progression.  She has very little pain to  speak of other than with higher level activities such as toe raises.  She is well motivated and compliant.  She should continue to improve.    She has f/u with MD on 01/14/23.   She will have 4 more visits left after her appt on Thursday but the approval date expired.  We will request date extension from insurance and continue unless otherwise indicated by MD.   She would benefit from her last 4 visits of  skilled PT for left ankle ROM and stability training to restore patient to prior level of function.    OBJECTIVE IMPAIRMENTS: Abnormal gait, decreased balance, decreased mobility, difficulty walking, decreased ROM, decreased strength, increased edema, increased fascial restrictions, increased muscle spasms, impaired flexibility, and pain.   ACTIVITY LIMITATIONS: carrying, lifting, bending, standing, squatting, sleeping, stairs, transfers, bathing, toileting, and dressing  PARTICIPATION LIMITATIONS: meal prep, cleaning, laundry, driving, shopping, community activity, yard work, and church  PERSONAL FACTORS: Age, Fitness, and 1-2 comorbidities: Htn, Right shoulder and left wrist past injuries are also affecting patient's functional outcome.   REHAB POTENTIAL: Good  CLINICAL DECISION MAKING: Stable/uncomplicated  EVALUATION COMPLEXITY: Low   GOALS: Goals reviewed with patient? Yes  SHORT TERM GOALS: Target date: 12/16/2022   Pain report to be no greater than 4/10  Baseline: Goal status: met 3/7  2.  Patient will be independent with initial HEP  Baseline:  Goal status: met 3/7  3.  Patient to ambulate vs using wheelchair at all times Baseline: WC majority of the time Goal status: met 3/7  LONG TERM GOALS: Target date: 01/13/2023   Pain report to be no greater than 2/10  Baseline:  Goal status: MET  2.  Patient will be independent with advanced HEP  Baseline:  Goal status: MET  3.  FOTO to improve to predicted outcome of 66 Baseline:  Goal status: MET  4.  TUG and 5 times sit  to stand to improve by 2-3 seconds Baseline:  Goal status: MET  5.  Patient to report 85% improvement in overall symptoms  Baseline:  Goal status: MET  6.  Patient to demonstrate normal heel to toe progression safely without a.d.  Baseline:  Goal status: IN PROGRESS   PLAN:  PT FREQUENCY: 1-2x/week  PT DURATION: 8 weeks  PLANNED INTERVENTIONS: Therapeutic exercises, Therapeutic activity, Neuromuscular re-education, Balance training, Gait training, Patient/Family education, Self Care, Joint mobilization, Stair training, Orthotic/Fit training, DME instructions, Aquatic Therapy, Dry Needling, Electrical stimulation, Cryotherapy, Moist heat, Compression bandaging, scar mobilization, Splintting, Taping, Vasopneumatic device, Ionotophoresis 4mg /ml Dexamethasone , Manual therapy, and Re-evaluation  PLAN FOR NEXT SESSION: foot and ankle flexibility, incline stretching for gastroc and soleus;  strength progression as ROM allows; gait training and standing tolerance; Nu-Step    Bridgette Campus B. Juan Kissoon, PT 03/08/24 11:31 AM Bridgette Campus B. Torrey Horseman, PT 03/08/24 11:31 AM  [facility] [address] [place], [place] [address] Phone # [phone] Fax [phone]

## 2023-01-14 ENCOUNTER — Encounter: Payer: Self-pay | Admitting: Orthopaedic Surgery

## 2023-01-14 ENCOUNTER — Ambulatory Visit (INDEPENDENT_AMBULATORY_CARE_PROVIDER_SITE_OTHER): Payer: Medicare HMO | Admitting: Orthopaedic Surgery

## 2023-01-14 VITALS — BP 167/79 | Ht 66.0 in | Wt 149.0 lb

## 2023-01-14 DIAGNOSIS — S82842A Displaced bimalleolar fracture of left lower leg, initial encounter for closed fracture: Secondary | ICD-10-CM | POA: Diagnosis not present

## 2023-01-14 NOTE — Progress Notes (Signed)
Office Visit Note   Patient: Jeanne ManisSheila J Overholt           Date of Birth: 1946/01/26           MRN: [id number] Visit Date: 01/14/2023              Requested by: Roger KillWilliams, Breejante J, PA-C [address] [place],  [place] [id number] PCP: Roger KillWilliams, Breejante J, PA-C   Assessment & Plan: Visit Diagnoses:  1. Bimalleolar ankle fracture, left, closed,  sequela     Plan: Patient work on equal stride length.  Standing on a step at home 1 foot is using the opposite right foot to balance hang onto some object and then stand and let body weight stretch her heel cord she will do this 5 to 10 minutes twice a day and I discussed with her that this will definitely lengthen her heel cord which will help with her gait.  She will do some additional therapy visits on balance and strengthening.  She has been hesitant to get in the shower on her own since she is worried she might fall.  She is continuing to make gradual progress and can follow-up with me on an as-needed basis.  Additional therapy for ankle dorsiflexion and balance and strengthening ordered.  Follow-Up Instructions: No follow-ups on file.   Orders:  No orders of the defined types were placed in this encounter.  No orders of the defined types were placed in this encounter.     Procedures: No procedures performed   Clinical Data: No additional findings.   Subjective: Chief Complaint  Patient presents with   Left Ankle - Follow-up    09/13/2022 ORIF left bimalleolar fracture    HPI follow-up left bimalleolar ankle fracture 12//23.  She is ambulating heel-toe gait better but short strides with her right foot.  She does not have much limitation of plantarflexion but needs to work on making a little longer stride.  Dorsiflexion on the injured left ankle is about 5 degrees.  She needs to work on heel cord stretching more.  Review of Systems updated unchanged.   Objective: Vital Signs: BP (!) 167/79   Ht 5\' 6"  (1.676 m)   Wt  149 lb (67.6 kg)   BMI 24.05 kg/m   Physical Exam Constitutional:      Appearance: She is well-developed.  HENT:     Head: Normocephalic.     Right Ear: External ear normal.     Left Ear: External ear normal. There is no impacted cerumen.  Eyes:     Pupils: Pupils are equal, round, and reactive to light.  Neck:     Thyroid: No thyromegaly.     Trachea: No tracheal deviation.  Cardiovascular:     Rate and Rhythm: Normal rate.  Pulmonary:     Effort: Pulmonary effort is normal.  Abdominal:     Palpations: Abdomen is soft.  Musculoskeletal:     Cervical back: No rigidity.  Skin:    General: Skin is warm and dry.  Neurological:     Mental Status: She is alert and oriented to person, place, and time.  Psychiatric:        Behavior: Behavior normal.     Ortho Exam dorsiflexion 5 degrees plantarflexion 20 degrees.  Heel-toe gait slightly shorter stride length with the right foot.  She can ambulate without her cane. Decreased balance. Specialty Comments:  No specialty comments available.  Imaging: No results found.   PMFS History: Patient  Active Problem List   Diagnosis Date Noted   Bimalleolar ankle fracture, left, closed,  sequela 09/08/2022   Exertional chest pain 07/14/2020   Abnormal stress test 07/14/2020   Trochanteric bursitis, left hip 09/18/2019   Impingement syndrome of left shoulder 06/06/2019   Cervical spondylosis 10/02/2011   ARTHRITIS 02/27/2008   GASTRITIS 01/16/2008   ABDOMINAL PAIN, EPIGASTRIC 01/15/2008   Past Medical History:  Diagnosis Date   GERD (gastroesophageal reflux disease)    TAKES PRILOSEC   Headache(784.0)    SINCE NECK  THING   High cholesterol    TAKES LIPITOR   Hypertension    PONV (postoperative nausea and vomiting)    NOTHING IN THE LAST COUPLE YRS   Rotator cuff tear, right    JUST FOUND OUT THIS WEEK    Family History  Problem Relation Age of Onset   Heart attack Mother    Leukemia Father    Diabetes Maternal  Grandmother    Breast cancer Cousin     Past Surgical History:  Procedure Laterality Date   ABDOMINAL HYSTERECTOMY     ANTERIOR CERVICAL DECOMP/DISCECTOMY FUSION  10/01/2011   Procedure: ANTERIOR CERVICAL DECOMPRESSION/DISCECTOMY FUSION 2 LEVELS;  Surgeon: Eldred Manges;  [facility];  Service: Orthopedics;  Laterality: N/A;  C5-6, C6-7 Anterior Cervical Discectomy and Fusion, Allograft and Plate   BLEPHAROPLASTY     2001   BREAST BIOPSY Left 11/20/2019   BREAST EXCISIONAL BIOPSY Right    BREAST SURGERY     10 YRS  BENIGN   FRACTURE SURGERY     LEFT  WRIST   LEFT HEART CATH AND CORONARY ANGIOGRAPHY N/A 07/15/2020   Procedure: LEFT HEART CATH AND CORONARY ANGIOGRAPHY;  Surgeon: Elder Negus, MD;  [facility];  Service: Cardiovascular;  Laterality: N/A;   LEFT WRIST     METAL PLATE    SHOULDER SURGERY Right    arthroscopy and rotator cuff repair   TUBAL LIGATION     Social History   Occupational History   Not on file  Tobacco Use   Smoking status: Never   Smokeless tobacco: Never  Vaping Use   Vaping Use: Never used  Substance and Sexual Activity   Alcohol use: No   Drug use: No   Sexual activity: Not on file

## 2023-01-20 ENCOUNTER — Other Ambulatory Visit: Payer: Self-pay | Admitting: Physician Assistant

## 2023-01-20 DIAGNOSIS — Z1231 Encounter for screening mammogram for malignant neoplasm of breast: Secondary | ICD-10-CM

## 2023-01-28 ENCOUNTER — Telehealth: Payer: Self-pay | Admitting: Orthopaedic Surgery

## 2023-01-28 ENCOUNTER — Encounter: Payer: Self-pay | Admitting: Orthopaedic Surgery

## 2023-01-28 NOTE — Telephone Encounter (Signed)
Patient called. Says her leg is swollen. Is going to send a picture to her mychart. Her call back number is [phone]

## 2023-01-28 NOTE — Telephone Encounter (Signed)
My Chart message has been sent to Dr. Ophelia Charter for review and to advise.

## 2023-02-01 ENCOUNTER — Other Ambulatory Visit: Payer: Self-pay

## 2023-02-01 ENCOUNTER — Encounter: Payer: Self-pay | Admitting: Orthopaedic Surgery

## 2023-02-01 ENCOUNTER — Ambulatory Visit: Payer: Medicare HMO | Admitting: Orthopaedic Surgery

## 2023-02-01 VITALS — BP 138/84 | HR 76 | Ht 66.0 in | Wt 149.0 lb

## 2023-02-01 DIAGNOSIS — S82842A Displaced bimalleolar fracture of left lower leg, initial encounter for closed fracture: Secondary | ICD-10-CM | POA: Diagnosis not present

## 2023-02-01 DIAGNOSIS — M76822 Posterior tibial tendinitis, left leg: Secondary | ICD-10-CM | POA: Diagnosis not present

## 2023-02-01 NOTE — Progress Notes (Unsigned)
Office Visit Note   Patient: Jeanne Schwartz           Date of Birth: 06/27/1946           MRN: [id number] Visit Date: 02/01/2023              Requested by: Roger Kill, PA-C [address] [place],  [place] [address] PCP: Roger Kill, PA-C   Assessment & Plan: Visit Diagnoses:  1. Bimalleolar ankle fracture, left, closed, initial encounter     Plan: With rehabilitation and activity she has developed some posterior tibial tendinitis.  X-rays look good with progressive healing.  Will place her in a lace up Swede-O and she can follow-up.  We discussed using Aspercreme over the posterior tibial tendon use of the Swede-O use of the walker and slightly decrease her activities to let this settle down.  After that I think she can resume her activities and made good progress.  Recheck 6 weeks.  Follow-Up Instructions: Return in about 6 weeks (around 03/15/2023).   Orders:  Orders Placed This Encounter  Procedures   XR Ankle Complete Left   No orders of the defined types were placed in this encounter.     Procedures: No procedures performed   Clinical Data: No additional findings.   Subjective: Chief Complaint  Patient presents with   Left Ankle - Follow-up    09/13/2022 ORIF left bimalleolar ankle fracture    77 year old female returns post ORIF left bimalleolar ankle fracture dislocation 09/15/2023 medial malleolus was comminuted fixed with 2 screws.  She was doing well and then started noting swelling pain and swelling is primarily on the medial aspect.  She has been ambulating with her cane but states she is not walking as well and she was last month.  Review of Systems updated unchanged   Objective: Vital Signs: BP 138/84   Pulse 76   Ht  (1.676 m)   Wt 149 lb (67.6 kg)   BMI 24.05 kg/m   Physical Exam Constitutional:      Appearance: She is well-developed.  HENT:     Head: Normocephalic.     Right Ear: External ear normal.      Left Ear: External ear normal. There is no impacted cerumen.  Eyes:     Pupils: Pupils are equal, round, and reactive to light.  Neck:     Thyroid: No thyromegaly.     Trachea: No tracheal deviation.  Cardiovascular:     Rate and Rhythm: Normal rate.  Pulmonary:     Effort: Pulmonary effort is normal.  Abdominal:     Palpations: Abdomen is soft.  Musculoskeletal:     Cervical back: No rigidity.  Skin:    General: Skin is warm and dry.  Neurological:     Mental Status: She is alert and oriented to person, place, and time.  Psychiatric:        Behavior: Behavior normal.     Ortho Exam patient has tenderness directly over the posterior tibial tendon with swelling pain with resisted function incisions are well-healed no erythema no tenderness at the fracture sites good ankle range of motion.  Pain with resisted posterior tibial function.  Specialty Comments:  No specialty comments available.  Imaging: No results found.   PMFS History: Patient Active Problem List   Diagnosis Date Noted   Bimalleolar ankle fracture, left, closed,  sequela 09/08/2022   Exertional chest pain 07/14/2020   Abnormal stress test 07/14/2020  Trochanteric bursitis, left hip 09/18/2019   Impingement syndrome of left shoulder 06/06/2019   Cervical spondylosis 10/02/2011   ARTHRITIS 02/27/2008   GASTRITIS 01/16/2008   ABDOMINAL PAIN, EPIGASTRIC 01/15/2008   Past Medical History:  Diagnosis Date   GERD (gastroesophageal reflux disease)    TAKES PRILOSEC   Headache(784.0)    SINCE NECK  THING   High cholesterol    TAKES LIPITOR   Hypertension    PONV (postoperative nausea and vomiting)    NOTHING IN THE LAST COUPLE YRS   Rotator cuff tear, right    JUST FOUND OUT THIS WEEK    Family History  Problem Relation Age of Onset   Heart attack Mother    Leukemia Father    Diabetes Maternal Grandmother    Breast cancer Cousin     Past Surgical History:  Procedure Laterality Date   ABDOMINAL  HYSTERECTOMY     ANTERIOR CERVICAL DECOMP/DISCECTOMY FUSION  10/01/2011   Procedure: ANTERIOR CERVICAL DECOMPRESSION/DISCECTOMY FUSION 2 LEVELS;  Surgeon: Eldred Manges;  [facility]: [facility];  Service: Orthopedics;  Laterality: N/A;  C5-6, C6-7 Anterior Cervical Discectomy and Fusion, Allograft and Plate   BLEPHAROPLASTY     2001   BREAST BIOPSY Left 11/20/2019   BREAST EXCISIONAL BIOPSY Right    BREAST SURGERY     10 YRS  BENIGN   FRACTURE SURGERY     LEFT  WRIST   LEFT HEART CATH AND CORONARY ANGIOGRAPHY N/A 07/15/2020   Procedure: LEFT HEART CATH AND CORONARY ANGIOGRAPHY;  Surgeon: Elder Negus, MD;  [facility];  Service: Cardiovascular;  Laterality: N/A;   LEFT WRIST     METAL PLATE    SHOULDER SURGERY Right    arthroscopy and rotator cuff repair   TUBAL LIGATION     Social History   Occupational History   Not on file  Tobacco Use   Smoking status: Never   Smokeless tobacco: Never  Vaping Use   Vaping Use: Never used  Substance and Sexual Activity   Alcohol use: No   Drug use: No   Sexual activity: Not on file

## 2023-02-03 DIAGNOSIS — M76829 Posterior tibial tendinitis, unspecified leg: Secondary | ICD-10-CM | POA: Insufficient documentation

## 2023-02-09 ENCOUNTER — Ambulatory Visit: Payer: Medicare HMO

## 2023-02-24 ENCOUNTER — Encounter: Payer: Self-pay | Admitting: Orthopaedic Surgery

## 2023-02-25 NOTE — Telephone Encounter (Signed)
noted 

## 2023-02-28 ENCOUNTER — Ambulatory Visit: Payer: Medicare HMO

## 2023-03-15 ENCOUNTER — Ambulatory Visit: Payer: Medicare HMO | Admitting: Orthopaedic Surgery

## 2023-03-15 VITALS — BP 139/82

## 2023-03-15 DIAGNOSIS — S82842A Displaced bimalleolar fracture of left lower leg, initial encounter for closed fracture: Secondary | ICD-10-CM | POA: Diagnosis not present

## 2023-03-15 DIAGNOSIS — M76822 Posterior tibial tendinitis, left leg: Secondary | ICD-10-CM | POA: Diagnosis not present

## 2023-03-15 NOTE — Progress Notes (Signed)
Office Visit Note   Patient: Jeanne Schwartz           Date of Birth: [birth year]           MRN: [id number] Visit Date: 03/15/2023              Requested by: Roger Kill, PA-C [id number] [place],  [place] [address] PCP: Roger Kill, PA-C   Assessment & Plan: Visit Diagnoses:  1. 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 Bimalleolar ankle fracture, left, closed,  sequela     Plan: Patient can continue support stockings till the swelling is gone continue use of the Swede-O she can skip Swede-O she has some special function she needs to participate in.  She understands that this should continue to improve with unloading of the tendon using the ankle brace.  She can follow-up with me on an as-needed basis and is happy she is continue to make slow gradual progress just not as quick as she was hoping.  Follow-Up Instructions: Return in about 6 months (around 09/14/2023).   Orders:  No orders of the defined types were placed in this encounter.  No orders of the defined types were placed in this encounter.     Procedures: No procedures performed   Clinical Data: No additional findings.   Subjective: Chief Complaint  Patient presents with   Left Ankle - Follow-up    09/13/2022 ORIF left bimalleolar ankle fracture    HPI 77 year old female 6 months post left comminuted bimalleolar ankle fracture healed.  She has had problems with posterior tibial tendinopathy as well as swelling.  No MRI of her ankle.  She has been wearing a lace up Swede-O is getting around better and has some shoes her daughter found for which work well with the Swede-O.  She is walking better most time normal gait sometimes short strides on the opposite right to decrease some of the ankle dorsiflexion.  Review of Systems updated unchanged   Objective: Vital Signs: BP 139/82   Physical Exam Constitutional:      Appearance: She is well-developed.  HENT:     Head:  Normocephalic.     Right Ear: External ear normal.     Left Ear: External ear normal. There is no impacted cerumen.  Eyes:     Pupils: Pupils are equal, round, and reactive to light.  Neck:     Thyroid: No thyromegaly.     Trachea: No tracheal deviation.  Cardiovascular:     Rate and Rhythm: Normal rate.  Pulmonary:     Effort: Pulmonary effort is normal.  Abdominal:     Palpations: Abdomen is soft.  Musculoskeletal:     Cervical back: No rigidity.  Skin:    General: Skin is warm and dry.  Neurological:     Mental Status: She is alert and oriented to person, place, and time.  Psychiatric:        Behavior: Behavior normal.     Ortho Exam still mild lower extremity edema and teds.  Tenderness over the posterior tibial tendon on the left negative on the right.  She does have good strength.  There appears to be fluid in the sheath with palpation.  Specialty Comments:  No specialty comments available.  Imaging: No results found.   PMFS History: Patient Active Problem List   Diagnosis Date Noted   Posterior tibial tendonitis 02/03/2023   Bimalleolar ankle fracture, left, closed,  sequela 09/08/2022  Exertional chest pain 07/14/2020   Abnormal stress test 07/14/2020   Trochanteric bursitis, left hip 09/18/2019   Impingement syndrome of left shoulder 06/06/2019   Cervical spondylosis 10/02/2011   ARTHRITIS 02/27/2008   GASTRITIS 01/16/2008   ABDOMINAL PAIN, EPIGASTRIC 01/15/2008   Past Medical History:  Diagnosis Date   GERD (gastroesophageal reflux disease)    TAKES PRILOSEC   Headache(784.0)    SINCE NECK  THING   High cholesterol    TAKES LIPITOR   Hypertension    PONV (postoperative nausea and vomiting)    NOTHING IN THE LAST COUPLE YRS   Rotator cuff tear, right    JUST FOUND OUT THIS WEEK    Family History  Problem Relation Age of Onset   Heart attack Mother    Leukemia Father    Diabetes Maternal Grandmother    Breast cancer Cousin     Past  Surgical History:  Procedure Laterality Date   ABDOMINAL HYSTERECTOMY     ANTERIOR CERVICAL DECOMP/DISCECTOMY FUSION  10/01/2011   Procedure: ANTERIOR CERVICAL DECOMPRESSION/DISCECTOMY FUSION 2 LEVELS;  Surgeon: Eldred Manges;  Location: [facility] OR;  Service: Orthopedics;  Laterality: N/A;  C5-6, C6-7 Anterior Cervical Discectomy and Fusion, Allograft and Plate   BLEPHAROPLASTY     2001   BREAST BIOPSY Left 11/20/2019   BREAST EXCISIONAL BIOPSY Right    BREAST SURGERY     10 YRS  BENIGN   FRACTURE SURGERY     LEFT  WRIST   LEFT HEART CATH AND CORONARY ANGIOGRAPHY N/A 07/15/2020   Procedure: LEFT HEART CATH AND CORONARY ANGIOGRAPHY;  Surgeon: Elder Negus, MD;  Location: [facility] IN[facility];  Service: Cardiovascular;  Laterality: N/A;   LEFT WRIST     METAL PLATE    SHOULDER SURGERY Right    arthroscopy and rotator cuff repair   TUBAL LIGATION     Social History   Occupational History   Not on file  Tobacco Use   Smoking status: Never   Smokeless tobacco: Never  Vaping Use   Vaping Use: Never used  Substance and Sexual Activity   Alcohol use: No   Drug use: No   Sexual activity: Not on file

## 2023-05-26 DIAGNOSIS — I10 Essential (primary) hypertension: Secondary | ICD-10-CM | POA: Insufficient documentation

## 2023-05-26 DIAGNOSIS — E782 Mixed hyperlipidemia: Secondary | ICD-10-CM | POA: Insufficient documentation

## 2023-05-26 DIAGNOSIS — M810 Age-related osteoporosis without current pathological fracture: Secondary | ICD-10-CM | POA: Insufficient documentation

## 2023-08-19 ENCOUNTER — Ambulatory Visit: Payer: Self-pay | Admitting: Cardiology

## 2023-09-06 ENCOUNTER — Ambulatory Visit: Payer: Medicare HMO | Attending: Cardiology | Admitting: Cardiology

## 2023-09-06 ENCOUNTER — Encounter: Payer: Self-pay | Admitting: Cardiology

## 2023-09-06 VITALS — BP 152/82 | HR 76 | Resp 16 | Ht 66.0 in | Wt 141.8 lb

## 2023-09-06 DIAGNOSIS — I10 Essential (primary) hypertension: Secondary | ICD-10-CM

## 2023-09-06 DIAGNOSIS — E782 Mixed hyperlipidemia: Secondary | ICD-10-CM | POA: Diagnosis not present

## 2023-09-06 NOTE — Patient Instructions (Signed)
Medication Instructions:  Your physician recommends that you continue on your current medications as directed. Please refer to the Current Medication list given to you today.  *If you need a refill on your cardiac medications before your next appointment, please call your pharmacy*  Lab Work: None ordered today. If you have labs (blood work) drawn today and your tests are completely normal, you will receive your results only by: MyChart Message (if you have MyChart) OR A paper copy in the mail If you have any lab test that is abnormal or we need to change your treatment, we will call you to review the results.  Testing/Procedures: None ordered today.  Follow-Up: At [facility], you and your health needs are our priority.  As part of our continuing mission to provide you with exceptional heart care, we have created designated Provider Care Teams.  These Care Teams include your primary Cardiologist (physician) and Advanced Practice Providers (APPs -  Physician Assistants and Nurse Practitioners) who all work together to provide you with the care you need, when you need it.  We recommend signing up for the patient portal called "MyChart".  Sign up information is provided on this After Visit Summary.  MyChart is used to connect with patients for Virtual Visits (Telemedicine).  Patients are able to view lab/test results, encounter notes, upcoming appointments, etc.  Non-urgent messages can be sent to your provider as well.   To learn more about what you can do with MyChart, go to [email or web address].    Your next appointment:   1 year(s)  The format for your next appointment:   In Person  Provider:   Tessa Lerner, DO {

## 2023-09-06 NOTE — Progress Notes (Signed)
Cardiology Office Note:  .   Date:  09/06/2023  ID:  Jeanne Schwartz, DOB 08-May-1946, MRN [id number] PCP:  Bernita Buffy  Former Cardiology Providers: NA [facility] Providers Cardiologist:  Tessa Lerner, DO , [facility] (established care August 2021) Electrophysiologist:  None  Click to update primary MD,subspecialty MD or APP then REFRESH:1}    Chief Complaint  Patient presents with   Essential hypertension   Follow-up    1 year    History of Present Illness: .   Jeanne Schwartz is a 76 y.o. Caucasian female whose past medical history and cardiovascular risk factors includes: Hypertension, hyperlipidemia, postmenopausal female, advanced age .  Patient was referred to the practice for evaluation of chest pressure and shortness of breath.  She has undergone appropriate ischemic workup.  In the past her blood pressures have been elevated but at home they usually are 120 mmHg.  She presents today for 1 year follow-up visit.  Over the last 1 year patient denies any anginal chest pain or heart failure symptoms.  Overall functional capacity remains relatively stable.  Blood pressures on current antihypertensive medications are between 120-125 mmHg.  Outside labs from August 2024 independently reviewed.  Triglycerides are not at goal.  I suspect that they were performed when she was nonfasting, patient does not recall.  Review of Systems: .   Review of Systems  Cardiovascular:  Negative for chest pain, claudication, irregular heartbeat, leg swelling, near-syncope, orthopnea, palpitations, paroxysmal nocturnal dyspnea and syncope.  Respiratory:  Negative for shortness of breath.   Hematologic/Lymphatic: Negative for bleeding problem.    Studies Reviewed:   EKG: EKG Interpretation Date/Time:  Tuesday September 06 2023 11:32:00 EST Ventricular Rate:  71 PR Interval:  148 QRS Duration:  76 QT Interval:  394 QTC Calculation: 428 R Axis:   66  Text  Interpretation: Normal sinus rhythm Normal ECG When compared with ECG of 15-Jul-2020 09:48, No significant change was found Confirmed by Tessa Lerner [phone]) on 09/06/2023 11:50:08 AM  Echocardiogram: 06/05/2020:  Left ventricle cavity is normal in size. Mild concentric hypertrophy of the left ventricle. Normal global wall motion. Normal LV systolic function  with EF 55%. Normal diastolic filling pattern. Calculated EF 55%.  Mild tricuspid regurgitation. Estimated pulmonary artery systolic pressure 24 mmHg   Stress Testing: Lexiscan (Walking with mod Bruce)Tetrofosmin Stress Test  06/02/2020: Non-diagnostic ECG stress. There is decreased counts suggestive of reversible moderate defect in the inferior and apical regions, the defect is probably contributed by very small resting and stress  LV volume (54mL) than true ischemia.  Overall LV systolic function is normal without regional wall motion abnormalities. Stress LV EF: 72%.  No previous exam available for comparison. Intermediate risk scan. Clinical correlation recommended.    Heart Catheterization: 07/15/2020: Angiographically normal epicardial coronary arteries with no coronary artery disease. Normal LVEDP.  RADIOLOGY: N/A  Risk Assessment/Calculations:   N/A   Labs:       Latest Ref Rng & Units 11/29/2022    5:24 AM 07/15/2020   10:25 AM 09/14/2012   12:00 PM  CBC  WBC 4.0 - 10.5 K/uL 5.3  5.7  5.8   Hemoglobin 12.0 - 15.0 g/dL 62.9  52.8  41.3   Hematocrit 36.0 - 46.0 % 42.5  43.5  39.6   Platelets 150 - 400 K/uL 285  246  261        Latest Ref Rng & Units 11/29/2022    5:24 AM 06/23/2020  2:18 PM 09/14/2012   12:00 PM  BMP  Glucose 70 - 99 mg/dL 161  96  95   BUN 8 - 23 mg/dL 6  15  15    Creatinine 0.44 - 1.00 mg/dL 0.96  0.45  4.09   BUN/Creat Ratio 12 - 28  13    Sodium 135 - 145 mmol/L 139  143  140   Potassium 3.5 - 5.1 mmol/L 3.5  5.1  4.0   Chloride 98 - 111 mmol/L 103  106  102   CO2 22 - 32 mmol/L 22  25   24    Calcium 8.9 - 10.3 mg/dL 81.1  9.5  91.4       Latest Ref Rng & Units 11/29/2022    5:24 AM 06/23/2020    2:18 PM 09/14/2012    5:40 PM  CMP  Glucose 70 - 99 mg/dL 782  96    BUN 8 - 23 mg/dL 6  15    Creatinine 9.56 - 1.00 mg/dL 2.13  0.86    Sodium 578 - 145 mmol/L 139  143    Potassium 3.5 - 5.1 mmol/L 3.5  5.1    Chloride 98 - 111 mmol/L 103  106    CO2 22 - 32 mmol/L 22  25    Calcium 8.9 - 10.3 mg/dL 46.9  9.5    Total Protein 6.0 - 8.3 g/dL   6.9   Total Bilirubin 0.3 - 1.2 mg/dL   0.6   Alkaline Phos 39 - 117 U/L   50   AST 0 - 37 U/L   25   ALT 0 - 35 U/L   21     No results found for: "CHOL", "HDL", "LDLCALC", "LDLDIRECT", "TRIG", "CHOLHDL" No results for input(s): "LIPOA" in the last 8760 hours. No components found for: "NTPROBNP" No results for input(s): "PROBNP" in the last 8760 hours. No results for input(s): "TSH" in the last 8760 hours.  External Labs: Collected: August 2024 available in Care Everywhere. Total cholesterol 183, triglycerides 213, HDL 57, LDL calculated 95, non-HDL 126 Potassium 4.9. BUN 13, creatinine 1.2. eGFR 47. AST ALT and alkaline phosphatase within normal limits  Physical Exam:    Today's Vitals   09/06/23 1126  BP: (!) 152/82  Pulse: 76  Resp: 16  SpO2: 96%  Weight: 141 lb 12.8 oz (64.3 kg)  Height: 5\' 6"  (1.676 m)   Body mass index is 22.89 kg/m. Wt Readings from Last 3 Encounters:  09/06/23 141 lb 12.8 oz (64.3 kg)  02/01/23 149 lb (67.6 kg)  01/14/23 149 lb (67.6 kg)    Physical Exam  Constitutional: No distress.  hemodynamically stable  Neck: No JVD present.  Cardiovascular: Normal rate, regular rhythm, S1 normal and S2 normal. Exam reveals no gallop, no S3 and no S4.  No murmur heard. Pulmonary/Chest: Effort normal and breath sounds normal. No stridor. She has no wheezes. She has no rales.  Abdominal: Soft. Bowel sounds are normal. She exhibits no distension. There is no abdominal tenderness.   Musculoskeletal:        General: No edema.     Cervical back: Neck supple.  Neurological: She is alert and oriented to person, place, and time. She has intact cranial nerves (2-12).  Skin: Skin is warm.     Impression & Recommendation(s):  Impression:   ICD-10-CM   1. Essential hypertension  I10 EKG 12-Lead    2. Mixed hyperlipidemia  E78.2  Recommendation(s):  Essential hypertension Office blood pressures are not at goal.   Home blood pressures are very well-controlled on current medical therapy. Reemphasized importance of low-salt diet. Continue lisinopril 20 mg p.o. daily. Continue Toprol-XL 25 mg p.o. daily Would recommend a goal SBP of 130 mmHg given her age and comorbid disease.  Mixed hyperlipidemia Currently on Lipitor 20 mg p.o. nightly.   She denies myalgia or other side effects. Most recent lipids dated August 2024, independently reviewed as noted above.  LDL 95 mg/dL and TAG 244 mg/dL. I suspect these labs were performed when she is not fasting. I have encouraged her to implement lifestyle changes, adhere to medical therapy, and to have her weight checked with PCP. If repeat labs also illustrated elevated hypertriglyceridemia pharmacological therapy could be considered. Currently managed by primary care provider.   Orders Placed:  Orders Placed This Encounter  Procedures   EKG 12-Lead   As part of medical decision making prior diagnostic workup, labs from August 2024 in Care Everywhere, EKG.  Final Medication List:   No orders of the defined types were placed in this encounter.   There are no discontinued medications.   Current Outpatient Medications:    alendronate (FOSAMAX) 70 MG tablet, Take 70 mg by mouth once a week., Disp: , Rfl:    atorvastatin (LIPITOR) 20 MG tablet, Take 20 mg by mouth at bedtime. , Disp: , Rfl:    Calcium Carb-Cholecalciferol (CALCIUM 600-D PO), Take 650 mg by mouth daily., Disp: , Rfl:    CRANBERRY PO, Take 1 tablet  by mouth daily at 12 noon., Disp: , Rfl:    Cyanocobalamin (B-12 PO), Take 1 tablet by mouth daily., Disp: , Rfl:    cycloSPORINE (RESTASIS) 0.05 % ophthalmic emulsion, Place 1 drop into both eyes daily as needed (dry eyes). , Disp: , Rfl:    Flaxseed, Linseed, (FLAX SEEDS PO), Take by mouth., Disp: , Rfl:    lisinopril (ZESTRIL) 20 MG tablet, Take 20 mg by mouth daily., Disp: , Rfl:    metoprolol succinate (TOPROL-XL) 25 MG 24 hr tablet, TAKE 1 TABLET EVERY MORNING, Disp: 90 tablet, Rfl: 3   Multiple Vitamin (MULTIVITAMIN) capsule, Take 1 capsule by mouth daily. , Disp: , Rfl:    omeprazole (PRILOSEC) 20 MG capsule, Take 20 mg by mouth daily., Disp: , Rfl:    vitamin C (ASCORBIC ACID) 250 MG tablet, Take 250 mg by mouth daily., Disp: , Rfl:   Consent:   N/A  Disposition:   1 year follow-up sooner if needed Patient may be asked to follow-up sooner based on the results of the above-mentioned testing.  Her questions and concerns were addressed to her satisfaction. She voices understanding of the recommendations provided during this encounter.    Signed, Tessa Lerner, DO, [facility]  [facility]  [address] [place], [place] [address] 09/06/2023 12:55 PM

## 2023-09-13 ENCOUNTER — Ambulatory Visit: Payer: Medicare HMO | Admitting: Orthopaedic Surgery

## 2023-09-13 ENCOUNTER — Encounter: Payer: Self-pay | Admitting: Orthopaedic Surgery

## 2023-09-13 VITALS — BP 122/80 | HR 69 | Ht 66.0 in | Wt 135.0 lb

## 2023-09-13 DIAGNOSIS — S82842A Displaced bimalleolar fracture of left lower leg, initial encounter for closed fracture: Secondary | ICD-10-CM | POA: Diagnosis not present

## 2023-09-13 NOTE — Progress Notes (Signed)
Office Visit Note   Patient: Jeanne Schwartz           Date of Birth: 08/24/1946           MRN: [id number]    Orders:  No orders of the defined types were placed in this encounter.  No orders of the defined types were placed in this encounter.  Plan:   Patient will continue with community ambulation.  She is happy with her results of surgery and had considerable problems with stiffness and problems with resuming normal gait activity which she has now achieved.  She can follow-up on an as-needed basis.    Procedures: No procedures performed   Clinical Data: No additional findings.   Subjective: Chief Complaint  Patient presents with   Left Ankle - Follow-up    09/13/2022  ORIF left bimalleolar ankle fracture    HPI 77 year old female 1 year follow-up of bimalleolar ankle fracture.  She had considerable problems with limping gait and range of motion and has had extensive physical therapy with progressive improvement.  She states she is doing well sometimes notices trace swelling every once in a while.  She she had some problems postop with posterior tibial tendinitis as well as some trochanteric bursitis.  She is not community ambulating without pain.  Review of Systems 14 point system updated unchanged.   Objective: Vital Signs: BP 122/80   Pulse 69   Ht 5\' 6"  (1.676 m)   Wt 135 lb (61.2 kg)   BMI 21.79 kg/m   Physical Exam Constitutional:      Appearance: She is well-developed.  HENT:     Head: Normocephalic.     Right Ear: External ear normal.     Left Ear: External ear normal. There is no impacted cerumen.  Eyes:     Pupils: Pupils are equal, round, and reactive to light.  Neck:     Thyroid: No thyromegaly.     Trachea: No tracheal deviation.  Cardiovascular:     Rate and Rhythm: Normal rate.  Pulmonary:     Effort: Pulmonary effort is normal.  Abdominal:     Palpations: Abdomen is soft.  Musculoskeletal:     Cervical back: No rigidity.  Skin:     General: Skin is warm and dry.  Neurological:     Mental Status: She is alert and oriented to person, place, and time.  Psychiatric:        Behavior: Behavior normal.     Ortho Exam well-healed medial and lateral incisions good ankle range of motion trace swelling today.  She is ambulating without limping.  Specialty Comments:  No specialty comments available.  Imaging: No results found.   PMFS History: Patient Active Problem List   Diagnosis Date Noted   Posterior tibial tendonitis 02/03/2023   Bimalleolar ankle fracture, left, closed,  sequela 09/08/2022   Exertional chest pain 07/14/2020   Abnormal stress test 07/14/2020   Trochanteric bursitis, left hip 09/18/2019   Impingement syndrome of left shoulder 06/06/2019   Cervical spondylosis 10/02/2011   ARTHRITIS 02/27/2008   GASTRITIS 01/16/2008   ABDOMINAL PAIN, EPIGASTRIC 01/15/2008   Past Medical History:  Diagnosis Date   GERD (gastroesophageal reflux disease)    TAKES PRILOSEC   Headache(784.0)    SINCE NECK  THING   High cholesterol    TAKES LIPITOR   Hypertension    PONV (postoperative nausea and vomiting)    NOTHING IN THE LAST COUPLE YRS   Rotator cuff tear, right  JUST FOUND OUT THIS WEEK    Family History  Problem Relation Age of Onset   Heart attack Mother    Leukemia Father    Diabetes Maternal Grandmother    Breast cancer Cousin     Past Surgical History:  Procedure Laterality Date   ABDOMINAL HYSTERECTOMY     ANTERIOR CERVICAL DECOMP/DISCECTOMY FUSION  10/01/2011   Procedure: ANTERIOR CERVICAL DECOMPRESSION/DISCECTOMY FUSION 2 LEVELS;  Surgeon: Eldred Manges;  Location: [facility] OR;  Service: Orthopedics;  Laterality: N/A;  C5-6, C6-7 Anterior Cervical Discectomy and Fusion, Allograft and Plate   BLEPHAROPLASTY     2001   BREAST BIOPSY Left 11/20/2019   BREAST EXCISIONAL BIOPSY Right    BREAST SURGERY     10 YRS  BENIGN   FRACTURE SURGERY     LEFT  WRIST   LEFT HEART CATH AND CORONARY  ANGIOGRAPHY N/A 07/15/2020   Procedure: LEFT HEART CATH AND CORONARY ANGIOGRAPHY;  Surgeon: Elder Negus, MD;  Location: [facility] INVASIVE [facility] LAB;  Service: Cardiovascular;  Laterality: N/A;   LEFT WRIST     METAL PLATE    SHOULDER SURGERY Right    arthroscopy and rotator cuff repair   TUBAL LIGATION     Social History   Occupational History   Not on file  Tobacco Use   Smoking status: Never   Smokeless tobacco: Never  Vaping Use   Vaping status: Never Used  Substance and Sexual Activity   Alcohol use: No   Drug use: No   Sexual activity: Not on file

## 2023-10-19 ENCOUNTER — Other Ambulatory Visit: Payer: Self-pay | Admitting: Cardiology

## 2023-11-24 ENCOUNTER — Telehealth: Payer: Self-pay | Admitting: Neurology

## 2023-11-24 NOTE — Telephone Encounter (Signed)
Pt called to confirm appointment.

## 2023-12-01 ENCOUNTER — Ambulatory Visit: Payer: Medicare HMO | Admitting: Neurology

## 2024-01-05 ENCOUNTER — Encounter (HOSPITAL_COMMUNITY): Payer: Self-pay | Admitting: Obstetrics and Gynecology

## 2024-01-05 NOTE — Progress Notes (Signed)
 Spoke w/ via phone for pre-op interview--- Jeanne Schwartz needs dos----  CBC per surgeon and BMP per surgeon.       Lab results------Current EKG in Epic dated 09/06/23. COVID test -----patient states asymptomatic no test needed Arrive at -------1115 NPO after MN NO Solid Food.  Clear liquids from MN until---1015 Pre-Surgery Ensure or G2:  Med rec completed Medications to take morning of surgery ----- Metoprolol and Prilosec Diabetic medication -----  GLP1 agonist last dose: GLP1 instructions:  Patient instructed no nail polish to be worn day of surgery Patient instructed to bring photo id and insurance card day of surgery Patient aware to have Driver (ride ) / caregiver    for 24 hours after surgery - Husband Jeanne Schwartz Patient Special Instructions ----- Shower with antibacterial soap. Pre-Op special Instructions -----  Patient verbalized understanding of instructions that were given at this phone interview. Patient denies chest pain, sob, fever, cough at the interview.

## 2024-01-09 DIAGNOSIS — K6289 Other specified diseases of anus and rectum: Secondary | ICD-10-CM | POA: Insufficient documentation

## 2024-01-09 DIAGNOSIS — K5900 Constipation, unspecified: Secondary | ICD-10-CM | POA: Insufficient documentation

## 2024-01-09 DIAGNOSIS — Z860100 Personal history of colon polyps, unspecified: Secondary | ICD-10-CM | POA: Insufficient documentation

## 2024-01-09 DIAGNOSIS — Z9289 Personal history of other medical treatment: Secondary | ICD-10-CM | POA: Insufficient documentation

## 2024-01-09 DIAGNOSIS — K602 Anal fissure, unspecified: Secondary | ICD-10-CM | POA: Insufficient documentation

## 2024-01-09 DIAGNOSIS — Z1211 Encounter for screening for malignant neoplasm of colon: Secondary | ICD-10-CM | POA: Insufficient documentation

## 2024-01-10 ENCOUNTER — Telehealth: Payer: Self-pay

## 2024-01-10 ENCOUNTER — Encounter: Payer: Self-pay | Admitting: Neurology

## 2024-01-10 ENCOUNTER — Ambulatory Visit: Payer: Medicare HMO | Admitting: Neurology

## 2024-01-10 VITALS — BP 138/77 | HR 69 | Ht 66.0 in | Wt 144.0 lb

## 2024-01-10 DIAGNOSIS — J383 Other diseases of vocal cords: Secondary | ICD-10-CM | POA: Diagnosis not present

## 2024-01-10 NOTE — Telephone Encounter (Signed)
 Needs Botox auth. New start.   95874/emg guidance    Botox [id number] Units:100   Chemical Denervation of the larynx with emg: cpt 64617 Icd 10: J38.5

## 2024-01-10 NOTE — Progress Notes (Unsigned)
 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       ASSESSMENT AND PLAN  Jeanne Schwartz is a 78 y.o. female   Spastic dysphonia  Prior authorization for Botox 100 units  Return to clinic with Jeanne. Jenne Schwartz for EMG guided injection  DIAGNOSTIC DATA (LABS, IMAGING, TESTING) - I reviewed patient records, labs, notes, testing and imaging myself where available.   MEDICAL HISTORY:  Jeanne Schwartz, seen in request by   Jeanne Reading, MD [facility], DO  [address] [place],  [place] [address], [place], [place]   History is obtained from the patient and review of electronic medical records. I personally reviewed pertinent available imaging films in PACS.   PMHx of  HTN HLD Osteoporosis, GERD Chronic Kidney Disease. ACDF C5-6-7, in Dec 2012,  Blepharoplasty History of left wrist surgery was in   Voice   Surgicl center, when she has left wrist surgeyr   Voice issue has changed, voice goes in and out, nothing come out, hoarse, soundings,   Could nto use, phone x5 years,   Aug 11 2023:  After adequate topical anesthetic was applied, 2 mm flexible laryngoscope was passed through the nasal cavity without difficulty. Flexible laryngoscopy shows patent anterior nasal cavity with minimal crusting, no discharge or infection.  Normal base of tongue and supraglottis Vocal cord movement shows pattern consistent with spasmodic dysphonia without vocal cord nodule, mass, polyp or tumor. Hypopharynx shows only minimal erythema of arytenoids without mass, pooling of secretions or aspiratio   PHYSICAL EXAM:   Vitals:   01/10/24 1051  BP: 138/77  Pulse: 69  Weight: 144 lb (65.3 kg)  Height: 5\' 6"  (1.676 m)   Not recorded     Body mass index is 23.24 kg/m.  PHYSICAL EXAMNIATION:  Gen: NAD, conversant, well nourised, well groomed                      Cardiovascular: Regular rate rhythm, no peripheral edema, warm, nontender. Eyes: Conjunctivae clear without exudates or hemorrhage Neck: Supple, no carotid bruits. Pulmonary: Clear to auscultation bilaterally   NEUROLOGICAL EXAM:  MENTAL STATUS: Speech/cognition: Awake, alert, oriented to history taking and casual conversation CRANIAL NERVES: CN II: Visual fields are full to confrontation. Pupils are round equal and briskly reactive to light. CN III, IV, VI: extraocular movement are normal. No ptosis. CN V: Facial sensation is intact to light touch CN VII: Face is symmetric with normal eye closure  CN VIII: Hearing is normal to causal conversation. CN IX, X: Phonation is normal. CN XI: Head turning and shoulder shrug are intact  MOTOR: There is no pronator drift of out-stretched arms. Muscle bulk and tone are normal. Muscle strength is normal.  REFLEXES: Reflexes are 2+ and symmetric at the biceps, triceps, knees, and ankles. Plantar responses are flexor.  SENSORY: Intact to light touch, pinprick and vibratory sensation are intact in fingers and toes.  COORDINATION: There is no trunk or limb dysmetria noted.  GAIT/STANCE: Posture is normal. Gait is steady with normal steps, base, arm swing, and turning. Heel and toe walking are normal. Tandem gait is normal.  Romberg is absent.  REVIEW OF SYSTEMS:  Full 14 system review of systems performed and notable only for as above All other review of systems were negative.   ALLERGIES: Allergies  Allergen Reactions   Compazine [Prochlorperazine] Shortness Of Breath and Palpitations   Meclizine Anaphylaxis   Penicillins Shortness Of Breath   Prochlorperazine Edisylate Shortness Of Breath   Sulfonamide Derivatives Shortness Of Breath    HOME MEDICATIONS: Current Outpatient Medications  Medication Sig Dispense Refill   alendronate (FOSAMAX) 70 MG tablet Take 70 mg by mouth once a week.     atorvastatin (LIPITOR) 20 MG tablet  Take 20 mg by mouth at bedtime.      Calcium Carb-Cholecalciferol (CALCIUM 600-D PO) Take 650 mg by mouth daily.     CRANBERRY PO Take 1 tablet by mouth daily at 12 noon.     Cyanocobalamin (B-12 PO) Take 1 tablet by mouth daily.     cycloSPORINE (RESTASIS) 0.05 % ophthalmic emulsion Place 1 drop into both eyes daily as needed (dry eyes).      Flaxseed, Linseed, (FLAX SEEDS PO) Take by mouth.     lisinopril (ZESTRIL) 20 MG tablet Take 20 mg by mouth daily.     metoprolol succinate (TOPROL-XL) 25 MG 24 hr tablet TAKE 1 TABLET EVERY MORNING 90 tablet 3   Multiple Vitamin (MULTIVITAMIN) capsule Take 1 capsule by mouth daily.      omeprazole (PRILOSEC) 20 MG capsule Take 20 mg by mouth daily.     vitamin C (ASCORBIC ACID) 250 MG tablet Take 250 mg by mouth daily.     No current facility-administered medications for this visit.    PAST MEDICAL HISTORY: Past Medical History:  Diagnosis Date   Chronic kidney disease    GERD (gastroesophageal reflux disease)    TAKES PRILOSEC   Headache(784.0)    SINCE NECK  THING   High cholesterol    TAKES LIPITOR   Hypertension    PONV (postoperative nausea and vomiting)    NOTHING IN THE LAST COUPLE YRS   Rotator cuff tear, right    JUST FOUND OUT THIS WEEK    PAST SURGICAL HISTORY: Past Surgical History:  Procedure Laterality Date   ABDOMINAL HYSTERECTOMY     ANTERIOR CERVICAL DECOMP/DISCECTOMY FUSION  10/01/2011   Procedure: ANTERIOR CERVICAL DECOMPRESSION/DISCECTOMY FUSION 2 LEVELS;  Surgeon: Eldred Manges;  Location: [facility];  Service: Orthopedics;  Laterality: N/A;  C5-6, C6-7 Anterior Cervical Discectomy and Fusion, Allograft and Plate   BLEPHAROPLASTY     2001   BREAST BIOPSY Left 11/20/2019   BREAST EXCISIONAL BIOPSY Right    BREAST SURGERY     10 YRS  BENIGN   FRACTURE SURGERY     LEFT  WRIST   LEFT HEART CATH AND CORONARY ANGIOGRAPHY N/A 07/15/2020   Procedure: LEFT HEART CATH AND CORONARY ANGIOGRAPHY;  Surgeon: Elder Negus,  MD;  [facility];  Service: Cardiovascular;  Laterality: N/A;   LEFT WRIST     METAL PLATE    SHOULDER SURGERY Right    arthroscopy and rotator cuff repair   TUBAL LIGATION      FAMILY HISTORY: Family History  Problem Relation Age of Onset   Heart attack Mother    Leukemia Father    Diabetes Maternal Grandmother    Breast cancer Cousin     SOCIAL HISTORY: Social History   Socioeconomic History   Marital status: Married    Spouse name: Not on file   Number of children: 1   Years of education: Not on file   Highest education level: Not on file  Occupational History   Not on file  Tobacco Use  Smoking status: Never   Smokeless tobacco: Never  Vaping Use   Vaping status: Never Used  Substance and Sexual Activity   Alcohol use: No   Drug use: No   Sexual activity: Not on file  Other Topics Concern   Not on file  Social History Narrative   LIves at home with husband , Reuel Boom.  One child DanniLaii Christell Constant.    Social Drivers of Health   Financial Resource Strain: Low Risk  (10/28/2020)   [facility] prior to 12/11/2022., [facility] visits prior to 12/11/2022.   Overall Financial Resource Strain (CARDIA)    Difficulty of Paying Living Expenses: Not hard at all  Food Insecurity: Low Risk  (12/08/2023)   [facility]   Hunger Vital Sign    Worried About Running Out of Food in the Last Year: Never true    Ran Out of Food in the Last Year: Never true  Transportation Needs: No Transportation Needs (12/08/2023)   Received from [employer]    In the past 12 months, has lack of reliable transportation kept you from medical appointments, meetings, work or from getting things needed for daily living? : No  Physical Activity: Sufficiently Active (10/28/2020)   Received from [facility] visits prior to 12/11/2022., [facility]  visits prior to 12/11/2022.   Exercise Vital Sign    Days of Exercise per Week: 6 days    Minutes of Exercise per Session: 60 min  Stress: No Stress Concern Present (10/28/2020)   [facility] prior to 12/11/2022., [facility] visits prior to 12/11/2022.   [employer] of Occupational Health - Occupational Stress Questionnaire    Feeling of Stress : Only a little  Social Connections: Socially Integrated (10/28/2020)   Received from [facility] visits prior to 12/11/2022., [facility] visits prior to 12/11/2022.   Social Advertising account executive [NHANES]    Frequency of Communication with Friends and Family: Twice a week    Frequency of Social Gatherings with Friends and Family: Once a week    Attends Religious Services: More than 4 times per year    Active Member of [employer] or Organizations: Yes    Attends Banker Meetings: More than 4 times per year    Marital Status: Married  Catering manager Violence: Not At Risk (10/28/2020)   [facility] prior to 12/11/2022., [facility] visits prior to 12/11/2022.   Humiliation, Afraid, Rape, and Kick questionnaire    Fear of Current or Ex-Partner: No    Emotionally Abused: No    Physically Abused: No    Sexually Abused: No      Levert Feinstein, M.D. Ph.D.  [facility] [address], [address] [place], [place] [address] Ph: [phone] Fax: [phone]  CC:  Jeanne Reading, MD [address] [place],  [place] [address]  [place], [place]

## 2024-01-11 NOTE — Telephone Encounter (Signed)
 Auth pending MD note completion.

## 2024-01-14 NOTE — H&P (Signed)
 Jeanne Schwartz is an 78 y.o. female with R ovarian cyst persistent, slightly enlarged. Persistent ovarian cyst.  D/W pt r/ba of surgery - DaVinci Robot Assisted Right Oopherectomy, also d/w pt process and expectations.  Possible LOA, also.    Pertinent Gynecological History: [id number] SVD x 1 Last pap 2/13, no abn pap No STI MMG UTD  Menstrual History:  No LMP recorded. Patient has had a hysterectomy.    Past Medical History:  Diagnosis Date   Chronic kidney disease    GERD (gastroesophageal reflux disease)    TAKES PRILOSEC   Headache(784.0)    SINCE NECK  THING   High cholesterol    TAKES LIPITOR   Hypertension    PONV (postoperative nausea and vomiting)    NOTHING IN THE LAST COUPLE YRS   Rotator cuff tear, right    JUST FOUND OUT THIS WEEK    Past Surgical History:  Procedure Laterality Date   ABDOMINAL HYSTERECTOMY     ANTERIOR CERVICAL DECOMP/DISCECTOMY FUSION  10/01/2011   Procedure: ANTERIOR CERVICAL DECOMPRESSION/DISCECTOMY FUSION 2 LEVELS;  Surgeon: Eldred Manges;  [facility];  Service: Orthopedics;  Laterality: N/A;  C5-6, C6-7 Anterior Cervical Discectomy and Fusion, Allograft and Plate   BLEPHAROPLASTY     2001   BREAST BIOPSY Left 11/20/2019   BREAST EXCISIONAL BIOPSY Right    BREAST SURGERY     10 YRS  BENIGN   FRACTURE SURGERY     LEFT  WRIST   LEFT HEART CATH AND CORONARY ANGIOGRAPHY N/A 07/15/2020   Procedure: LEFT HEART CATH AND CORONARY ANGIOGRAPHY;  Surgeon: Elder Negus, MD;  [facility];  Service: Cardiovascular;  Laterality: N/A;   LEFT WRIST     METAL PLATE    SHOULDER SURGERY Right    arthroscopy and rotator cuff repair   TUBAL LIGATION      Family History  Problem Relation Age of Onset   Heart attack Mother    Leukemia Father    Diabetes Maternal Grandmother    Breast cancer Cousin     Social History:  reports that she has never smoked. She has never used smokeless tobacco. She reports that she does not  drink alcohol and does not use drugs. married  Allergies:  Allergies  Allergen Reactions   Compazine [Prochlorperazine] Shortness Of Breath and Palpitations   Meclizine Anaphylaxis   Penicillins Shortness Of Breath    Last 10 15 years   Prochlorperazine Edisylate Shortness Of Breath   Sulfonamide Derivatives Shortness Of Breath    Meds: albuterl, aleedondronate, ASA, atorvastatin, MVI, lisinopril, metoprolol, omeprazole, restasis, Vit D  Review of Systems  Constitutional: Negative.   Respiratory: Negative.    Gastrointestinal: Negative.   Genitourinary: Negative.   Musculoskeletal: Negative.   Skin: Negative.   Neurological: Negative.   Psychiatric/Behavioral: Negative.      Height 5\' 6"  (1.676 m), weight 63.5 kg. Physical Exam Constitutional:      Appearance: Normal appearance.  HENT:     Head: Normocephalic and atraumatic.  Cardiovascular:     Rate and Rhythm: Normal rate and regular rhythm.  Pulmonary:     Effort: Pulmonary effort is normal.     Breath sounds: Normal breath sounds.  Abdominal:     General: Bowel sounds are normal.     Palpations: Abdomen is soft.  Genitourinary:    General: Normal vulva.     Rectum: Normal.  Musculoskeletal:        General: Normal range of  motion.     Cervical back: Normal range of motion and neck supple.  Skin:    General: Skin is warm and dry.  Neurological:     General: No focal deficit present.     Mental Status: She is alert and oriented to person, place, and time.  Psychiatric:        Mood and Affect: Mood normal.        Behavior: Behavior normal.      Assessment/Plan: 78yo [id number] with persistent R ovarian cyst for RA R oopherectomy D/w pt r/b/a of surgery, also process and expectations Post op meds sent to pharmacy  Daesean Lazarz Bovard-Stuckert 01/14/2024, 6:06 PM

## 2024-01-16 ENCOUNTER — Other Ambulatory Visit: Payer: Self-pay

## 2024-01-16 ENCOUNTER — Ambulatory Visit (HOSPITAL_COMMUNITY)
Admission: RE | Admit: 2024-01-16 | Discharge: 2024-01-16 | Disposition: A | Discharge status: Home / Self Care | Attending: Obstetrics and Gynecology | Admitting: Obstetrics and Gynecology

## 2024-01-16 ENCOUNTER — Encounter (HOSPITAL_COMMUNITY)
Admission: RE | Disposition: A | Discharge status: Home / Self Care | Payer: Self-pay | Source: Home / Self Care | Attending: Obstetrics and Gynecology

## 2024-01-16 ENCOUNTER — Ambulatory Visit (HOSPITAL_COMMUNITY): Payer: Self-pay | Admitting: Anesthesiology

## 2024-01-16 ENCOUNTER — Ambulatory Visit (HOSPITAL_BASED_OUTPATIENT_CLINIC_OR_DEPARTMENT_OTHER): Payer: Self-pay | Admitting: Anesthesiology

## 2024-01-16 ENCOUNTER — Encounter (HOSPITAL_COMMUNITY): Payer: Self-pay | Admitting: Obstetrics and Gynecology

## 2024-01-16 DIAGNOSIS — N189 Chronic kidney disease, unspecified: Secondary | ICD-10-CM | POA: Diagnosis not present

## 2024-01-16 DIAGNOSIS — I10 Essential (primary) hypertension: Secondary | ICD-10-CM

## 2024-01-16 DIAGNOSIS — N83201 Unspecified ovarian cyst, right side: Secondary | ICD-10-CM | POA: Insufficient documentation

## 2024-01-16 DIAGNOSIS — E782 Mixed hyperlipidemia: Secondary | ICD-10-CM

## 2024-01-16 DIAGNOSIS — Z7982 Long term (current) use of aspirin: Secondary | ICD-10-CM | POA: Diagnosis not present

## 2024-01-16 DIAGNOSIS — M1990 Unspecified osteoarthritis, unspecified site: Secondary | ICD-10-CM | POA: Diagnosis not present

## 2024-01-16 DIAGNOSIS — Z01818 Encounter for other preprocedural examination: Secondary | ICD-10-CM

## 2024-01-16 DIAGNOSIS — I129 Hypertensive chronic kidney disease with stage 1 through stage 4 chronic kidney disease, or unspecified chronic kidney disease: Secondary | ICD-10-CM | POA: Insufficient documentation

## 2024-01-16 DIAGNOSIS — Z79899 Other long term (current) drug therapy: Secondary | ICD-10-CM | POA: Insufficient documentation

## 2024-01-16 DIAGNOSIS — N736 Female pelvic peritoneal adhesions (postinfective): Secondary | ICD-10-CM | POA: Insufficient documentation

## 2024-01-16 DIAGNOSIS — K219 Gastro-esophageal reflux disease without esophagitis: Secondary | ICD-10-CM | POA: Diagnosis not present

## 2024-01-16 DIAGNOSIS — N183 Chronic kidney disease, stage 3 unspecified: Secondary | ICD-10-CM | POA: Diagnosis not present

## 2024-01-16 DIAGNOSIS — R519 Headache, unspecified: Secondary | ICD-10-CM | POA: Diagnosis not present

## 2024-01-16 DIAGNOSIS — N838 Other noninflammatory disorders of ovary, fallopian tube and broad ligament: Secondary | ICD-10-CM | POA: Insufficient documentation

## 2024-01-16 HISTORY — DX: Chronic kidney disease, unspecified: N18.9

## 2024-01-16 LAB — CBC
HCT: 50.4 % — ABNORMAL HIGH (ref 36.0–46.0)
Hemoglobin: 15.1 g/dL — ABNORMAL HIGH (ref 12.0–15.0)
MCH: 31.2 pg (ref 26.0–34.0)
MCHC: 30 g/dL (ref 30.0–36.0)
MCV: 104.1 fL — ABNORMAL HIGH (ref 80.0–100.0)
Platelets: 221 10*3/uL (ref 150–400)
RBC: 4.84 MIL/uL (ref 3.87–5.11)
RDW: 12.2 % (ref 11.5–15.5)
WBC: 6.4 10*3/uL (ref 4.0–10.5)
nRBC: 0 % (ref 0.0–0.2)

## 2024-01-16 LAB — BASIC METABOLIC PANEL WITH GFR
Anion gap: 13 (ref 5–15)
BUN: 17 mg/dL (ref 8–23)
CO2: 21 mmol/L — ABNORMAL LOW (ref 22–32)
Calcium: 9.3 mg/dL (ref 8.9–10.3)
Chloride: 107 mmol/L (ref 98–111)
Creatinine, Ser: 1.28 mg/dL — ABNORMAL HIGH (ref 0.44–1.00)
GFR, Estimated: 43 mL/min — ABNORMAL LOW (ref >60)
Glucose, Bld: 97 mg/dL (ref 70–99)
Potassium: 4.5 mmol/L (ref 3.5–5.1)
Sodium: 141 mmol/L (ref 135–145)

## 2024-01-16 SURGERY — ROBOTIC ASSISTED SALPINGO OOPHORECTOMY
Anesthesia: General | Laterality: Right

## 2024-01-16 MED ORDER — PHENYLEPHRINE 80 MCG/ML (10ML) SYRINGE FOR IV PUSH (FOR BLOOD PRESSURE SUPPORT)
PREFILLED_SYRINGE | INTRAVENOUS | Status: DC | PRN
  Administered 2024-01-16: 160 ug via INTRAVENOUS

## 2024-01-16 MED ORDER — BUPIVACAINE HCL (PF) 0.25 % IJ SOLN
INTRAMUSCULAR | Status: DC | PRN
  Administered 2024-01-16: 15 mL

## 2024-01-16 MED ORDER — ONDANSETRON HCL 4 MG/2ML IJ SOLN
INTRAMUSCULAR | Status: DC | PRN
  Administered 2024-01-16: 4 mg via INTRAVENOUS

## 2024-01-16 MED ORDER — OXYCODONE HCL 5 MG PO TABS: 5.0000 mg | ORAL_TABLET | Freq: Once | ORAL | Status: DC | PRN

## 2024-01-16 MED ORDER — DEXMEDETOMIDINE HCL IN NACL 80 MCG/20ML IV SOLN
INTRAVENOUS | Status: DC | PRN
  Administered 2024-01-16: 8 ug via INTRAVENOUS

## 2024-01-16 MED ORDER — ROCURONIUM BROMIDE 10 MG/ML (PF) SYRINGE
PREFILLED_SYRINGE | INTRAVENOUS | Status: AC
Start: 2024-01-16 — End: ?
  Filled 2024-01-16: qty 10

## 2024-01-16 MED ORDER — DEXAMETHASONE SODIUM PHOSPHATE 10 MG/ML IJ SOLN
INTRAMUSCULAR | Status: AC
Start: 2024-01-16 — End: ?
  Filled 2024-01-16: qty 1

## 2024-01-16 MED ORDER — ROCURONIUM BROMIDE 10 MG/ML (PF) SYRINGE
PREFILLED_SYRINGE | INTRAVENOUS | Status: DC | PRN
  Administered 2024-01-16: 50 mg via INTRAVENOUS

## 2024-01-16 MED ORDER — MIDAZOLAM HCL 2 MG/2ML IJ SOLN
INTRAMUSCULAR | Status: AC
  Filled 2024-01-16: qty 2

## 2024-01-16 MED ORDER — DEXAMETHASONE SODIUM PHOSPHATE 10 MG/ML IJ SOLN
INTRAMUSCULAR | Status: DC | PRN
  Administered 2024-01-16: 10 mg via INTRAVENOUS

## 2024-01-16 MED ORDER — OXYCODONE HCL 5 MG/5ML PO SOLN: 5.0000 mg | Freq: Once | ORAL | Status: DC | PRN

## 2024-01-16 MED ORDER — LACTATED RINGERS IV SOLN: INTRAVENOUS | Status: DC

## 2024-01-16 MED ORDER — ACETAMINOPHEN 500 MG PO TABS
ORAL_TABLET | ORAL | Status: AC
  Filled 2024-01-16: qty 2

## 2024-01-16 MED ORDER — FENTANYL CITRATE (PF) 100 MCG/2ML IJ SOLN: 25.0000 ug | INTRAMUSCULAR | Status: DC | PRN

## 2024-01-16 MED ORDER — POVIDONE-IODINE 10 % EX SWAB: 2.0000 | Freq: Once | CUTANEOUS | Status: DC

## 2024-01-16 MED ORDER — FENTANYL CITRATE (PF) 100 MCG/2ML IJ SOLN
INTRAMUSCULAR | Status: DC | PRN
  Administered 2024-01-16 (×3): 50 ug via INTRAVENOUS

## 2024-01-16 MED ORDER — ORAL CARE MOUTH RINSE: 15.0000 mL | Freq: Once | OROMUCOSAL | Status: AC

## 2024-01-16 MED ORDER — FENTANYL CITRATE (PF) 250 MCG/5ML IJ SOLN
INTRAMUSCULAR | Status: AC
  Filled 2024-01-16: qty 5

## 2024-01-16 MED ORDER — CHLORHEXIDINE GLUCONATE 0.12 % MT SOLN
15.0000 mL | Freq: Once | OROMUCOSAL | Status: AC
  Administered 2024-01-16: 15 mL via OROMUCOSAL
  Filled 2024-01-16: qty 15

## 2024-01-16 MED ORDER — GLYCOPYRROLATE 0.2 MG/ML IJ SOLN
INTRAMUSCULAR | Status: DC | PRN
  Administered 2024-01-16 (×2): .1 mg via INTRAVENOUS

## 2024-01-16 MED ORDER — PROPOFOL 10 MG/ML IV BOLUS
INTRAVENOUS | Status: AC
  Filled 2024-01-16: qty 20

## 2024-01-16 MED ORDER — ACETAMINOPHEN 500 MG PO TABS
1000.0000 mg | ORAL_TABLET | ORAL | Status: AC
  Administered 2024-01-16: 1000 mg via ORAL

## 2024-01-16 MED ORDER — AMISULPRIDE (ANTIEMETIC) 5 MG/2ML IV SOLN: 10.0000 mg | Freq: Once | INTRAVENOUS | Status: DC | PRN

## 2024-01-16 MED ORDER — LIDOCAINE 2% (20 MG/ML) 5 ML SYRINGE
INTRAMUSCULAR | Status: AC
Start: 2024-01-16 — End: ?
  Filled 2024-01-16: qty 5

## 2024-01-16 MED ORDER — LIDOCAINE 2% (20 MG/ML) 5 ML SYRINGE
INTRAMUSCULAR | Status: DC | PRN
  Administered 2024-01-16: 60 mg via INTRAVENOUS

## 2024-01-16 MED ORDER — BUPIVACAINE HCL (PF) 0.25 % IJ SOLN
INTRAMUSCULAR | Status: AC
Start: 2024-01-16 — End: ?
  Filled 2024-01-16: qty 30

## 2024-01-16 MED ORDER — SUGAMMADEX SODIUM 200 MG/2ML IV SOLN
INTRAVENOUS | Status: DC | PRN
  Administered 2024-01-16: 200 mg via INTRAVENOUS

## 2024-01-16 MED ORDER — PROPOFOL 10 MG/ML IV BOLUS
INTRAVENOUS | Status: DC | PRN
  Administered 2024-01-16: 120 mg via INTRAVENOUS

## 2024-01-16 MED ORDER — ACETAMINOPHEN 10 MG/ML IV SOLN: 1000.0000 mg | Freq: Once | INTRAVENOUS | Status: DC

## 2024-01-16 SURGICAL SUPPLY — 60 items
APPLICATOR ARISTA FLEXITIP XL (MISCELLANEOUS) IMPLANT
CANNULA CAP OBTURATR AIRSEAL 8 (CAP) ×2 IMPLANT
CANNULA REDUCER 12-8 DVNC XI (CANNULA) IMPLANT
CATH FOLEY 3WAY 5CC 16FR (CATHETERS) ×2 IMPLANT
COVER BACK TABLE 60X90IN (DRAPES) ×2 IMPLANT
COVER TIP SHEARS 8 DVNC (MISCELLANEOUS) ×2 IMPLANT
DEFOGGER SCOPE WARMER CLEARIFY (MISCELLANEOUS) ×2 IMPLANT
DERMABOND ADVANCED .7 DNX12 (GAUZE/BANDAGES/DRESSINGS) ×2 IMPLANT
DRAPE ARM DVNC X/XI (DISPOSABLE) ×8 IMPLANT
DRAPE COLUMN DVNC XI (DISPOSABLE) ×2 IMPLANT
DRAPE SURG IRRIG POUCH 19X23 (DRAPES) ×4 IMPLANT
DRAPE UTILITY XL STRL (DRAPES) ×2 IMPLANT
DRIVER NDL MEGA SUTCUT DVNCXI (INSTRUMENTS) ×2 IMPLANT
DRIVER NDLE MEGA SUTCUT DVNCXI (INSTRUMENTS) ×2 IMPLANT
DURAPREP 26ML APPLICATOR (WOUND CARE) ×2 IMPLANT
ELECT REM PT RETURN 9FT ADLT (ELECTROSURGICAL) ×2 IMPLANT
ELECTRODE REM PT RTRN 9FT ADLT (ELECTROSURGICAL) ×2 IMPLANT
FORCEPS BPLR FENES DVNC XI (FORCEP) ×2 IMPLANT
FORCEPS PROGRASP DVNC XI (FORCEP) IMPLANT
GAUZE 4X4 16PLY ~~LOC~~+RFID DBL (SPONGE) IMPLANT
GLOVE BIO SURGEON STRL SZ 6.5 (GLOVE) ×6 IMPLANT
HEMOSTAT ARISTA ABSORB 3G PWDR (HEMOSTASIS) IMPLANT
HOLDER FOLEY CATH W/STRAP (MISCELLANEOUS) IMPLANT
IRRIG SUCT STRYKERFLOW 2 WTIP (MISCELLANEOUS) ×2 IMPLANT
IRRIGATION SUCT STRKRFLW 2 WTP (MISCELLANEOUS) ×2 IMPLANT
KIT PINK PAD W/HEAD ARE REST (MISCELLANEOUS) ×2 IMPLANT
KIT PINK PAD W/HEAD ARM REST (MISCELLANEOUS) ×2 IMPLANT
KIT TURNOVER KIT B (KITS) ×2 IMPLANT
LEGGING LITHOTOMY PAIR STRL (DRAPES) ×2 IMPLANT
MANIFOLD NEPTUNE II (INSTRUMENTS) ×2 IMPLANT
MANIPULATOR ADVINCU DEL 2.5 PL (MISCELLANEOUS) IMPLANT
MANIPULATOR ADVINCU DEL 3.0 PL (MISCELLANEOUS) IMPLANT
MANIPULATOR ADVINCU DEL 3.5 PL (MISCELLANEOUS) IMPLANT
MANIPULATOR ADVINCU DEL 4.0 PL (MISCELLANEOUS) IMPLANT
NDL INSUFFLATION 14GA 120MM (NEEDLE) ×2 IMPLANT
NEEDLE INSUFFLATION 14GA 120MM (NEEDLE) ×2 IMPLANT
OBTURATOR OPTICAL STND 8 DVNC (TROCAR) ×2 IMPLANT
OBTURATOR OPTICALSTD 8 DVNC (TROCAR) ×2 IMPLANT
OCCLUDER COLPOPNEUMO (BALLOONS) IMPLANT
PACK ROBOT WH (CUSTOM PROCEDURE TRAY) ×2 IMPLANT
PACK ROBOTIC GOWN (GOWN DISPOSABLE) ×2 IMPLANT
PAD OB MATERNITY 11 LF (PERSONAL CARE ITEMS) ×2 IMPLANT
RTRCTR WOUND ALEXIS 18CM SML (INSTRUMENTS) IMPLANT
SAVER CELL AAL HAEMONETICS (INSTRUMENTS) IMPLANT
SCISSORS LAP 5X35 DISP (ENDOMECHANICALS) IMPLANT
SCISSORS MNPLR CVD DVNC XI (INSTRUMENTS) ×2 IMPLANT
SEAL UNIV 5-12 XI (MISCELLANEOUS) ×4 IMPLANT
SEALER VESSEL EXT DVNC XI (MISCELLANEOUS) IMPLANT
SET IRRIG Y TYPE TUR BLADDER L (SET/KITS/TRAYS/PACK) IMPLANT
SET TUBE FILTERED XL AIRSEAL (SET/KITS/TRAYS/PACK) ×2 IMPLANT
SLEEVE SCD COMPRESS KNEE MED (STOCKING) ×2 IMPLANT
SPIKE FLUID TRANSFER (MISCELLANEOUS) ×4 IMPLANT
SUT VIC AB 0 CT1 27XBRD ANBCTR (SUTURE) IMPLANT
SUT VIC AB 4-0 PS2 18 (SUTURE) ×2 IMPLANT
SUT VICRYL 0 UR6 27IN ABS (SUTURE) IMPLANT
SUT VLOC 180 0 9IN GS21 (SUTURE) ×2 IMPLANT
TOWEL GREEN STERILE (TOWEL DISPOSABLE) ×2 IMPLANT
TROCAR PORT AIRSEAL 8X100 (TROCAR) IMPLANT
UNDERPAD 30X36 HEAVY ABSORB (UNDERPADS AND DIAPERS) ×2 IMPLANT
WATER STERILE IRR 1000ML POUR (IV SOLUTION) ×2 IMPLANT

## 2024-01-16 NOTE — Anesthesia Preprocedure Evaluation (Addendum)
 Anesthesia Evaluation  Patient identified by MRN, date of birth, ID band Patient awake    Reviewed: Allergy & Precautions, NPO status , Patient's Chart, lab work & pertinent test results, reviewed documented beta blocker date and time   History of Anesthesia Complications (+) PONV and history of anesthetic complications  Airway Mallampati: I  TM Distance: >3 FB Neck ROM: Full    Dental no notable dental hx. (+) Teeth Intact, Dental Advisory Given   Pulmonary neg pulmonary ROS   Pulmonary exam normal breath sounds clear to auscultation       Cardiovascular hypertension, Pt. on home beta blockers and Pt. on medications Normal cardiovascular exam Rhythm:Regular Rate:Normal  Cath 2021 Left Main Vessel is normal in caliber. Vessel is angiographically normal. Left Anterior Descending Vessel is normal in caliber. Vessel is angiographically normal. Left Circumflex Vessel is normal in caliber. Vessel is angiographically normal. Right Coronary Artery Vessel is normal in caliber. Vessel is angiographically normal. Intervention No interventions have been documented.     Neuro/Psych  Headaches  negative psych ROS   GI/Hepatic Neg liver ROS,GERD  ,,  Endo/Other  negative endocrine ROS    Renal/GU Renal InsufficiencyRenal disease  negative genitourinary   Musculoskeletal  (+) Arthritis ,    Abdominal   Peds  Hematology negative hematology ROS (+)   Anesthesia Other Findings   Reproductive/Obstetrics                             Anesthesia Physical Anesthesia Plan  ASA: 3  Anesthesia Plan: General   Post-op Pain Management: Tylenol PO (pre-op)*   Induction: Intravenous  PONV Risk Score and Plan: 4 or greater and Dexamethasone, Ondansetron and Treatment may vary due to age or medical condition  Airway Management Planned: Oral ETT and Video Laryngoscope Planned  Additional Equipment:    Intra-op Plan:   Post-operative Plan: Extubation in OR  Informed Consent: I have reviewed the patients History and Physical, chart, labs and discussed the procedure including the risks, benefits and alternatives for the proposed anesthesia with the patient or authorized representative who has indicated his/her understanding and acceptance.     Dental advisory given  Plan Discussed with: CRNA  Anesthesia Plan Comments:        Anesthesia Quick Evaluation

## 2024-01-16 NOTE — Interval H&P Note (Signed)
 History and Physical Interval Note:  01/16/2024 12:30 PM  Jeanne Schwartz  has presented today for surgery, with the diagnosis of right ocarian cyst.  The various methods of treatment have been discussed with the patient and family. After consideration of risks, benefits and other options for treatment, the patient has consented to  Procedure(s) with comments: ROBOTIC ASSISTED SALPINGO OOPHORECTOMY (Right) - and CPT S2900 LYSIS, ADHESIONS, ROBOT-ASSISTED, LAPAROSCOPIC (N/A) as a surgical intervention.  The patient's history has been reviewed, patient examined, no change in status, stable for surgery.  I have reviewed the patient's chart and labs.  Questions were answered to the patient's satisfaction.     Mykeisha Dysert Bovard-Stuckert

## 2024-01-16 NOTE — Brief Op Note (Signed)
 01/16/2024  5:16 PM  PATIENT:  Jeanne Schwartz  78 y.o. female  PRE-OPERATIVE DIAGNOSIS:  right ovarian cyst  POST-OPERATIVE DIAGNOSIS:  right ovarian cyst  PROCEDURE:  Procedure(s) with comments: ROBOTIC ASSISTED SALPINGO OOPHORECTOMY (Right) - and CPT S2900 LYSIS, ADHESIONS, ROBOT-ASSISTED, LAPAROSCOPIC (N/A)  SURGEON:  Surgeons and Role:    * Bovard-Stuckert, Augusto Gamble, MD - Primary  ASSISTANTS: Madalyn Rob RNFA   ANESTHESIA:   local and general  EBL:  25 mL IVF and uop per anesthesia  DRAINS: none   LOCAL MEDICATIONS USED:  MARCAINE     SPECIMEN:  Source of Specimen:  right tube and ovary  DISPOSITION OF SPECIMEN:  PATHOLOGY  COUNTS:  YES  TOURNIQUET:  * No tourniquets in log *  DICTATION: .Other Dictation: Dictation Number [id number]  PLAN OF CARE: Discharge to home after PACU  PATIENT DISPOSITION:  PACU - hemodynamically stable.   Delay start of Pharmacological VTE agent (>24hrs) due to surgical blood loss or risk of bleeding: not applicable

## 2024-01-16 NOTE — Anesthesia Procedure Notes (Signed)
 Procedure Name: Intubation Date/Time: 01/16/2024 3:49 PM  Performed by: Sharyn Dross, CRNAPre-anesthesia Checklist: Patient identified, Emergency Drugs available, Suction available and Patient being monitored Patient Re-evaluated:Patient Re-evaluated prior to induction Oxygen Delivery Method: Circle system utilized Preoxygenation: Pre-oxygenation with 100% oxygen Induction Type: IV induction Ventilation: Mask ventilation without difficulty Laryngoscope Size: Glidescope and 3 Grade View: Grade I Tube type: Oral Tube size: 7.0 mm Number of attempts: 1 Airway Equipment and Method: Stylet and Oral airway Placement Confirmation: ETT inserted through vocal cords under direct vision, positive ETCO2 and breath sounds checked- equal and bilateral Secured at: 22 cm Tube secured with: Tape Dental Injury: Teeth and Oropharynx as per pre-operative assessment

## 2024-01-16 NOTE — Transfer of Care (Addendum)
 Immediate Anesthesia Transfer of Care Note  Patient: Jeanne Schwartz  Procedure(s) Performed: ROBOTIC ASSISTED SALPINGO OOPHORECTOMY (Right) LYSIS, ADHESIONS, ROBOT-ASSISTED, LAPAROSCOPIC  Patient Location: PACU  Anesthesia Type:General  Level of Consciousness: awake, alert , and oriented  Airway & Oxygen Therapy: Patient Spontanous Breathing  Post-op Assessment: Report given to RN, Post -op Vital signs reviewed and stable, and Patient moving all extremities  Post vital signs: Reviewed and stable  Last Vitals:  Vitals Value Taken Time  BP 142/83 01/16/24  1723  Temp 36.4 C 01/16/24 1723  Pulse 68 01/16/24 1726  Resp 13 01/16/24 1726  SpO2 99 % 01/16/24 1726  Vitals shown include unfiled device data.  Last Pain:  Vitals:   01/16/24 1142  TempSrc: Oral  PainSc: 0-No pain      Patients Stated Pain Goal: 6 (01/16/24 1142)  Complications: There were no known notable events for this encounter.

## 2024-01-17 ENCOUNTER — Encounter (HOSPITAL_COMMUNITY): Payer: Self-pay | Admitting: Obstetrics and Gynecology

## 2024-01-17 NOTE — Anesthesia Postprocedure Evaluation (Signed)
 Anesthesia Post Note  Patient: Jeanne Schwartz  Procedure(s) Performed: ROBOTIC ASSISTED SALPINGO OOPHORECTOMY (Right) LYSIS, ADHESIONS, ROBOT-ASSISTED, LAPAROSCOPIC     Patient location during evaluation: PACU Anesthesia Type: General Level of consciousness: awake and alert Pain management: pain level controlled Vital Signs Assessment: post-procedure vital signs reviewed and stable Respiratory status: spontaneous breathing, nonlabored ventilation, respiratory function stable and patient connected to nasal cannula oxygen Cardiovascular status: blood pressure returned to baseline and stable Postop Assessment: no apparent nausea or vomiting Anesthetic complications: no  There were no known notable events for this encounter.  Last Vitals:  Vitals:   01/16/24 1745 01/16/24 1800  BP: 138/66 138/69  Pulse: 68 72  Resp: 17 10  Temp:  36.6 C  SpO2: 99% 93%    Last Pain:  Vitals:   01/16/24 1800  TempSrc:   PainSc: 0-No pain   Pain Goal: Patients Stated Pain Goal: 6 (01/16/24 1142)                 Noreen Mackintosh L Haide Klinker

## 2024-01-17 NOTE — Op Note (Unsigned)
 NAMEPEYTEN, Jeanne Schwartz MEDICAL RECORD NO: [id number] ACCOUNT NO: [id number] DATE OF BIRTH: 1946/07/28 FACILITY: [facility] LOCATION: [facility] PHYSICIAN: Sherian Rein, MD  Operative Report   DATE OF PROCEDURE: 01/16/2024  PREOPERATIVE DIAGNOSIS:  Persistent right ovarian cyst.  POSTOPERATIVE DIAGNOSIS:  Persistent right ovarian cyst.  PROCEDURES PERFORMED:  Laparoscopic lysis of adhesions, extensive omental to anterior abdominal wall, as well as Da Vinci robot-assisted right salpingo-oophorectomy.  SURGEON:  Sherian Rein, MD.  ASSISTANT:  Wynonia Musty, RNFA.  ANESTHESIA:  Local and general.  ESTIMATED BLOOD LOSS:  25 mL.  IV FLUIDS AND URINE OUTPUT:  Per anesthesia.  COMPLICATIONS:  The case was extended by need to laparoscopically lyse numerous fine adhesions of omentum to anterior abdominal wall for visualization.  PATHOLOGY:  Right fallopian tube and ovary.  DESCRIPTION OF PROCEDURE:  After informed consent was reviewed with the patient, including risks, benefits, and alternatives of the surgical procedure, the patient was transported to the operating room and placed on the table in the supine position.   General anesthesia was induced and found to be adequate.  An appropriate timeout was then performed.  The patient was prepped and draped in the normal sterile fashion.  A Foley catheter was placed into the patient's bladder, and a sponge stick was placed  in the patient's vagina.  Gloves and gown were changed.  Attention was turned to the abdominal portion of the case, and an approximately 8-mm supraumbilical incision was made.  The fascia was cleared off with a hemostat.  Using Veress needle,  pneumoperitoneum was obtained after passing the hanging drop test with an opening pressure of 1 mmHg.  The trocar was placed under direct visualization.  Accessory ports were placed on both the right and left under direct visualization.  At this point,  it was noted that we  were unable to dock the robot due to the extensive adhesions as previously mentioned.  These were lysed with hot shears to allow visualization.  When visualization was obtained, the robot was docked.  The surgeon broke scrub and was  seated at the console.  The right ovary and fallopian tube were identified, extensively adhered to the right sidewall.  The ureter was confidently identified.  The bladder was backfilled to identify the extent of the bladder.  The sponge stick was used  to define the vagina.  The ovary was then teased and removed from the sidewall with the tube.  Likely what was thought to be a cyst was a hydrosalpinx.  This was removed and sent to Pathology.  Hemostasis was assured.  Instruments were removed.   Incisions were closed with 3-0 Vicryl and Dermabond.  The sponge stick was removed from the patient's vagina.  The patient tolerated the procedure well.  Sponge, lap, and needle counts were correct x 2 per the operating staff.    MUK D: 01/16/2024 5:22:56 pm T: 01/17/2024 1:34:00 am  JOB: [id number]/ [id number]

## 2024-01-18 LAB — SURGICAL PATHOLOGY

## 2024-01-19 NOTE — Telephone Encounter (Signed)
 Submitted auth to [facility] via CMM, status is pending. Key: [id number]

## 2024-01-20 MED ORDER — ONABOTULINUMTOXINA 100 UNITS IJ SOLR: 100.0000 [IU] | Freq: Once | INTRAMUSCULAR | Status: DC

## 2024-01-20 NOTE — Telephone Encounter (Signed)
 Prescription for Botox 100 units have been sent to [facility].

## 2024-01-20 NOTE — Addendum Note (Signed)
 Addended by: Berna Spare A on: 01/20/2024 08:08 AM   Modules accepted: Orders

## 2024-01-20 NOTE — Telephone Encounter (Signed)
 Received fax of approval from [facility], please send rx to [facility]. Auth#: [id number] (01/19/24-10/10/24)  LVM asking [facility] @ Dr. Jenne Pane office to call me back to set up an appointment.

## 2024-01-23 NOTE — Telephone Encounter (Signed)
 LVM x2 for [facility]ita @ Dr. Tellis Feathers office.

## 2024-01-31 NOTE — Telephone Encounter (Signed)
 LVM x3 for [facility] @ Dr. Tellis Feathers office.

## 2024-02-21 NOTE — Telephone Encounter (Addendum)
 LVM x4 for [facility] @ Dr. Tellis Feathers office. Also left a message with the front desk.

## 2024-02-21 NOTE — Telephone Encounter (Signed)
 Pt scheduled with Dr. Gracie Lav and Dr. Tellis Feathers for 5/21 @ 12:00 pm. Jeanne Schwartz pt and made her aware of appt date/time.

## 2024-02-23 ENCOUNTER — Other Ambulatory Visit: Payer: Self-pay

## 2024-02-23 ENCOUNTER — Other Ambulatory Visit (HOSPITAL_COMMUNITY): Payer: Self-pay

## 2024-02-23 NOTE — Telephone Encounter (Signed)
 Pt was approved for B/B. Auth#: [id number] (02/23/24-10/10/24)

## 2024-02-23 NOTE — Telephone Encounter (Signed)
 Pt's copay when filling through the pharmacy is $406.17, she is not eligible for copay card due to Medicare. Submitted urgent auth request via CMM to [facility] to see if B/B can be covered. Key: [id number]

## 2024-02-27 ENCOUNTER — Telehealth: Payer: Self-pay | Admitting: Neurology

## 2024-02-27 NOTE — Telephone Encounter (Signed)
 Appointment cx, pt sick

## 2024-02-28 NOTE — Telephone Encounter (Signed)
 LVM x2 for [facility].

## 2024-02-28 NOTE — Telephone Encounter (Signed)
 LVM for [facility] ENT to call me back to r/s appt.

## 2024-02-29 ENCOUNTER — Ambulatory Visit: Admitting: Neurology

## 2024-02-29 NOTE — Telephone Encounter (Signed)
 Torrie Frei called in to make sure pt's appt for today was cancelled, she is going to wait until patient reaches back out to her to r/s and we will coordinate Dr. Tellis Feathers and Dr. Torrance Freestone schedules then.

## 2024-04-26 NOTE — Telephone Encounter (Signed)
 Euna called in from [facility] ENT and we scheduled pt for 8/11 @ 12:00 pm. She will reach out to the pt to inform her of the day/time.

## 2024-05-21 ENCOUNTER — Ambulatory Visit: Admitting: Neurology

## 2024-05-21 VITALS — BP 124/78

## 2024-05-21 DIAGNOSIS — J383 Other diseases of vocal cords: Secondary | ICD-10-CM

## 2024-05-21 DIAGNOSIS — J385 Laryngeal spasm: Secondary | ICD-10-CM

## 2024-05-21 MED ORDER — ONABOTULINUMTOXINA 100 UNITS IJ SOLR: 2.5000 [IU] | Freq: Once | INTRAMUSCULAR | Status: DC

## 2024-05-21 NOTE — Progress Notes (Signed)
 Botox- 100 units x 1 vial Lot: [id number] Expiration: 2027/10 NDC: 9976-8854-98  Bacteriostatic 0.9% Sodium Chloride- 2 mL  Lot: [id number] Expiration: 08/10/25 NDC: 9590803397  Dx:  J38.3   B/B Witnessed by DR. Carlie

## 2024-05-21 NOTE — Progress Notes (Signed)
 ASSESSMENT AND PLAN  Jeanne Schwartz is a 78 y.o. female   Spastic dysphonia  Explained to her mechanism of action of Botox , and potential  side effect,  First EMG guided injection for spastic dysphonia, injection was performed by ENT Dr. Carlie, he documented separately,  Used 2.5 unit to left thyroid arytenoid muscle   DIAGNOSTIC DATA (LABS, IMAGING, TESTING) - I reviewed patient records, labs, notes, testing and imaging myself where available.   MEDICAL HISTORY:  Jeanne Schwartz, is a 78 year old female, companied by her husband husband seen in request by ENT Dr. Carlie, [place], for evaluation of botulism toxin injection for spastic dysphonia   History is obtained from the patient and review of electronic medical records. I personally reviewed pertinent available imaging films in PACS.   PMHx of  HTN HLD Osteoporosis, GERD Chronic Kidney Disease. ACDF C5-6-7, in Dec 2012,  Blepharoplasty History of left wrist surgery was in   She noticed gradual onset voice change since her cervical decompression surgery around 2012, gradually getting worse, seen by ENT Dr. Carlie, Aug 11 2023: Was diagnosed with spastic dysphonia, referred to our clinic to coordinate botulism  flexible laryngoscope:Vocal cord movement shows pattern consistent with spasmodic dysphonia without vocal cord nodule, mass, polyp or tumor. Hypopharynx shows only minimal erythema of arytenoids without mass, pooling of secretions or aspiratio   UPDATE August 11th 2025: First EMG guided injection with Dr. Carlie for spastic dysphonia  REVIEW OF SYSTEMS:  Full 14 system review of systems performed and notable only for as above All other review of systems were negative.   ALLERGIES: Allergies  Allergen Reactions   Compazine [Prochlorperazine] Shortness Of Breath and Palpitations   Meclizine Anaphylaxis   Penicillins Shortness Of Breath    Last 10 15 years   Prochlorperazine Edisylate Shortness Of Breath    Sulfonamide Derivatives Shortness Of Breath    HOME MEDICATIONS: Current Outpatient Medications  Medication Sig Dispense Refill   albuterol (VENTOLIN HFA) 108 (90 Base) MCG/ACT inhaler Inhale 2 puffs into the lungs every 6 (six) hours as needed (Coughing).     alendronate (FOSAMAX) 70 MG tablet Take 70 mg by mouth once a week.     aspirin  EC 81 MG tablet Take 81 mg by mouth daily. Swallow whole.     atorvastatin (LIPITOR) 20 MG tablet Take 20 mg by mouth at bedtime.      Calcium Carb-Cholecalciferol (CALCIUM 600-D PO) Take 500 mg by mouth daily.     cholecalciferol (VITAMIN D3) 25 MCG (1000 UNIT) tablet Take 1,000 Units by mouth daily.     CRANBERRY PO Take 500 mg by mouth 2 (two) times daily.     Cyanocobalamin (B-12 PO) Take 1,000 mcg by mouth daily.     cycloSPORINE (RESTASIS) 0.05 % ophthalmic emulsion Place 1 drop into both eyes daily as needed (dry eyes).      Fiber Gummies 2 g CHEW Chew 4 g by mouth daily.     Flaxseed, Linseed, (FLAX SEEDS PO) Take 30 mLs by mouth daily. 900 mg Omega 6 3590 Omega 3     ibuprofen (ADVIL) 800 MG tablet 800 mg 3 (three) times daily. For after surgery     lisinopril (ZESTRIL) 20 MG tablet Take 20 mg by mouth daily.     metoprolol  succinate (TOPROL -XL) 25 MG 24 hr tablet TAKE 1 TABLET EVERY MORNING 90 tablet 3   Multiple Vitamin (MULTIVITAMIN) capsule Take 1 capsule by mouth daily.  omeprazole (PRILOSEC) 20 MG capsule Take 20 mg by mouth daily.     PERCOCET 5-325 MG tablet Take 1 tablet by mouth every 6 (six) hours as needed for moderate pain (pain score 4-6) or severe pain (pain score 7-10). For after surgery     vitamin C (ASCORBIC ACID) 250 MG tablet Take 250 mg by mouth daily.     Current Facility-Administered Medications  Medication Dose Route Frequency Provider Last Rate Last Admin   botulinum toxin Type A  (BOTOX ) injection 100 Units  100 Units Intramuscular Once Onita Duos, MD        PAST MEDICAL HISTORY: Past Medical History:   Diagnosis Date   Chronic kidney disease    GERD (gastroesophageal reflux disease)    TAKES PRILOSEC   Headache(784.0)    SINCE NECK  THING   High cholesterol    TAKES LIPITOR   Hypertension    PONV (postoperative nausea and vomiting)    NOTHING IN THE LAST COUPLE YRS   Rotator cuff tear, right    JUST FOUND OUT THIS WEEK    PAST SURGICAL HISTORY: Past Surgical History:  Procedure Laterality Date   ABDOMINAL HYSTERECTOMY     ANTERIOR CERVICAL DECOMP/DISCECTOMY FUSION  10/01/2011   Procedure: ANTERIOR CERVICAL DECOMPRESSION/DISCECTOMY FUSION 2 LEVELS;  Surgeon: Oneil JAYSON Herald;  [facility]: [facility];  Service: Orthopedics;  Laterality: N/A;  C5-6, C6-7 Anterior Cervical Discectomy and Fusion, Allograft and Plate   BLEPHAROPLASTY     2001   BREAST BIOPSY Left 11/20/2019   BREAST EXCISIONAL BIOPSY Right    BREAST SURGERY     10 YRS  BENIGN   FRACTURE SURGERY     LEFT  WRIST   LEFT HEART CATH AND CORONARY ANGIOGRAPHY N/A 07/15/2020   Procedure: LEFT HEART CATH AND CORONARY ANGIOGRAPHY;  Surgeon: Elmira Newman PARAS, MD;  [facility]: [facility] INVASIVE [facility];  Service: Cardiovascular;  Laterality: N/A;   LEFT WRIST     METAL PLATE    ROBOTIC ASSISTED LAPAROSCOPIC LYSIS OF ADHESION N/A 01/16/2024   Procedure: LYSIS, ADHESIONS, ROBOT-ASSISTED, LAPAROSCOPIC;  Surgeon: Danielle Rom, MD;  Location: [facility] OR;  Service: Gynecology;  Laterality: N/A;   ROBOTIC ASSISTED SALPINGO OOPHERECTOMY Right 01/16/2024   Procedure: ROBOTIC ASSISTED SALPINGO OOPHORECTOMY;  Surgeon: Danielle Rom, MD;  Location: [facility] OR;  Service: Gynecology;  Laterality: Right;  and CPT S2900   SHOULDER SURGERY Right    arthroscopy and rotator cuff repair   TUBAL LIGATION      FAMILY HISTORY: Family History  Problem Relation Age of Onset   Heart attack Mother    Leukemia Father    Diabetes Maternal Grandmother    Breast cancer Cousin     SOCIAL HISTORY: Social History   Socioeconomic History   Marital status:  Married    Spouse name: Not on file   Number of children: 1   Years of education: Not on file   Highest education level: Not on file  Occupational History   Not on file  Tobacco Use   Smoking status: Never   Smokeless tobacco: Never  Vaping Use   Vaping status: Never Used  Substance and Sexual Activity   Alcohol use: No   Drug use: No   Sexual activity: Not on file  Other Topics Concern   Not on file  Social History Narrative   LIves at home with husband , Toribio.  One child DanniLaii Georgina.    Social Drivers of Corporate investment banker Strain: Low Risk  (  10/28/2020)   Received from [facility] visits prior to 12/11/2022.   Overall Financial Resource Strain (CARDIA)    Difficulty of Paying Living Expenses: Not hard at all  Food Insecurity: Low Risk  (12/08/2023)   Received from [facility]   Hunger Vital Sign    Within the past 12 months, you worried that your food would run out before you got money to buy more: Never true    Within the past 12 months, the food you bought just didn't last and you didn't have money to get more. : Never true  Transportation Needs: No Transportation Needs (12/08/2023)   Received from [facility]    In the past 12 months, has lack of reliable transportation kept you from medical appointments, meetings, work or from getting things needed for daily living? : No  Physical Activity: Sufficiently Active (10/28/2020)   Received from [facility] visits prior to 12/11/2022.   Exercise Vital Sign    On average, how many days per week do you engage in moderate to strenuous exercise (like a brisk walk)?: 6 days    On average, how many minutes do you engage in exercise at this level?: 60 min  Stress: No Stress Concern Present (10/28/2020)   Received from [facility] visits prior to 12/11/2022.   [employer] of Occupational Health - Occupational Stress Questionnaire     Feeling of Stress : Only a little  Social Connections: Socially Integrated (10/28/2020)   Received from [facility] visits prior to 12/11/2022.   Social Connection and Isolation Panel    In a typical week, how many times do you talk on the phone with family, friends, or neighbors?: Twice a week    How often do you get together with friends or relatives?: Once a week    How often do you attend church or religious services?: More than 4 times per year    Do you belong to any clubs or organizations such as church groups, unions, fraternal or athletic groups, or school groups?: Yes    How often do you attend meetings of the clubs or organizations you belong to?: More than 4 times per year    Are you married, widowed, divorced, separated, never married, or living with a partner?: Married  Intimate Partner Violence: Not At Risk (10/28/2020)   Received from [facility] visits prior to 12/11/2022.   Humiliation, Afraid, Rape, and Kick questionnaire    Within the last year, have you been afraid of your partner or ex-partner?: No    Within the last year, have you been humiliated or emotionally abused in other ways by your partner or ex-partner?: No    Within the last year, have you been kicked, hit, slapped, or otherwise physically hurt by your partner or ex-partner?: No    Within the last year, have you been raped or forced to have any kind of sexual activity by your partner or ex-partner?: No      Modena Callander, M.D. Ph.D.  [facility] [address], [address] [place], [place] [address] Ph: [phone] Fax: [phone]  CC:  Macarthur Elouise SQUIBB, DO [address]  [place] [address]  Lazoff, Shawn P, DO

## 2024-05-26 DIAGNOSIS — J385 Laryngeal spasm: Secondary | ICD-10-CM | POA: Diagnosis not present

## 2024-05-26 MED ORDER — ONABOTULINUMTOXINA 100 UNITS IJ SOLR
100.0000 [IU] | Freq: Once | INTRAMUSCULAR | Status: AC
  Administered 2024-05-26: 100 [IU] via INTRAMUSCULAR

## 2024-07-05 MED ORDER — ONABOTULINUMTOXINA 100 UNITS IJ SOLR: 100.0000 [IU] | Freq: Once | INTRAMUSCULAR | Status: AC

## 2024-07-05 NOTE — Telephone Encounter (Signed)
 [facility] ENT is requesting appt for Botox  for this pt with Dr. Onita and Dr. Carlie. She has an approval on file for SP but it wasn't used in the past due to high copay. Please resend Botox  100u rx to [facility], thank you!

## 2024-07-05 NOTE — Addendum Note (Signed)
 Addended by: JOSHUA IZETTA CROME on: 07/05/2024 03:56 PM   Modules accepted: Orders

## 2024-07-25 ENCOUNTER — Other Ambulatory Visit: Payer: Self-pay | Admitting: Cardiology

## 2024-08-13 ENCOUNTER — Encounter: Payer: Self-pay | Admitting: Radiology

## 2024-08-18 ENCOUNTER — Other Ambulatory Visit: Payer: Self-pay | Admitting: Cardiology

## 2024-08-21 ENCOUNTER — Encounter: Payer: Self-pay | Admitting: Neurology

## 2024-08-21 ENCOUNTER — Ambulatory Visit (INDEPENDENT_AMBULATORY_CARE_PROVIDER_SITE_OTHER): Admitting: Neurology

## 2024-08-21 VITALS — BP 151/77

## 2024-08-21 DIAGNOSIS — J383 Other diseases of vocal cords: Secondary | ICD-10-CM

## 2024-08-21 DIAGNOSIS — J385 Laryngeal spasm: Secondary | ICD-10-CM

## 2024-08-21 MED ORDER — ONABOTULINUMTOXINA 100 UNITS IJ SOLR
100.0000 [IU] | Freq: Once | INTRAMUSCULAR | Status: AC
  Administered 2024-08-21: 100 [IU] via INTRAMUSCULAR

## 2024-08-21 MED ORDER — ONABOTULINUMTOXINA 100 UNITS IJ SOLR: 5.0000 [IU] | Freq: Once | INTRAMUSCULAR | Status: DC

## 2024-08-21 NOTE — Progress Notes (Signed)
 ASSESSMENT AND PLAN  Jeanne Schwartz is a 78 y.o. female    EMG guided injection for spastic dysphonia, injection was performed by ENT Dr. Carlie, he documented the procedure note separately.  Used 2.5 unit to left thyroid arytenoid muscle and 2.5 units to right thyroid arytenoid.   DIAGNOSTIC DATA (LABS, IMAGING, TESTING) - I reviewed patient records, labs, notes, testing and imaging myself where available.   MEDICAL HISTORY:  Jeanne Schwartz, is a 78 year old female, companied by her husband husband seen in request by ENT Dr. Carlie, [place], for evaluation of botulism toxin injection for spastic dysphonia   History is obtained from the patient and review of electronic medical records. I personally reviewed pertinent available imaging films in PACS.   PMHx of  HTN HLD Osteoporosis, GERD Chronic Kidney Disease. ACDF C5-6-7, in Dec 2012,  Blepharoplasty History of left wrist surgery was in   She noticed gradual onset voice change since her cervical decompression surgery around 2012, gradually getting worse, seen by ENT Dr. Carlie, Aug 11 2023: Was diagnosed with spastic dysphonia, referred to our clinic to coordinate botulism  flexible laryngoscope:Vocal cord movement shows pattern consistent with spasmodic dysphonia without vocal cord nodule, mass, polyp or tumor. Hypopharynx shows only minimal erythema of arytenoids without mass, pooling of secretions or aspiratio   UPDATE August 11th 2025: First EMG guided injection with Dr. Carlie for spastic dysphonia  UPDATE Nov 11th 2025: She had mild short lasting benefit from her first left side injection in August, this time will receive an injection on both side  REVIEW OF SYSTEMS:  Full 14 system review of systems performed and notable only for as above All other review of systems were negative.   ALLERGIES: Allergies  Allergen Reactions   Compazine [Prochlorperazine] Shortness Of Breath and Palpitations   Meclizine  Anaphylaxis   Penicillins Shortness Of Breath    Last 10 15 years   Prochlorperazine Edisylate Shortness Of Breath   Sulfonamide Derivatives Shortness Of Breath    HOME MEDICATIONS: Current Outpatient Medications  Medication Sig Dispense Refill   albuterol (VENTOLIN HFA) 108 (90 Base) MCG/ACT inhaler Inhale 2 puffs into the lungs every 6 (six) hours as needed (Coughing).     alendronate (FOSAMAX) 70 MG tablet Take 70 mg by mouth once a week.     aspirin  EC 81 MG tablet Take 81 mg by mouth daily. Swallow whole.     atorvastatin (LIPITOR) 20 MG tablet Take 20 mg by mouth at bedtime.      Calcium Carb-Cholecalciferol (CALCIUM 600-D PO) Take 500 mg by mouth daily.     cholecalciferol (VITAMIN D3) 25 MCG (1000 UNIT) tablet Take 1,000 Units by mouth daily.     CRANBERRY PO Take 500 mg by mouth 2 (two) times daily.     Cyanocobalamin (B-12 PO) Take 1,000 mcg by mouth daily.     cycloSPORINE (RESTASIS) 0.05 % ophthalmic emulsion Place 1 drop into both eyes daily as needed (dry eyes).      Fiber Gummies 2 g CHEW Chew 4 g by mouth daily.     Flaxseed, Linseed, (FLAX SEEDS PO) Take 30 mLs by mouth daily. 900 mg Omega 6 3590 Omega 3     ibuprofen (ADVIL) 800 MG tablet 800 mg 3 (three) times daily. For after surgery     lisinopril (ZESTRIL) 20 MG tablet Take 20 mg by mouth daily.     metoprolol  succinate (TOPROL -XL) 25 MG 24 hr tablet TAKE 1 TABLET EVERY  MORNING 90 tablet 0   Multiple Vitamin (MULTIVITAMIN) capsule Take 1 capsule by mouth daily.      omeprazole (PRILOSEC) 20 MG capsule Take 20 mg by mouth daily.     PERCOCET 5-325 MG tablet Take 1 tablet by mouth every 6 (six) hours as needed for moderate pain (pain score 4-6) or severe pain (pain score 7-10). For after surgery     vitamin C (ASCORBIC ACID) 250 MG tablet Take 250 mg by mouth daily.     Current Facility-Administered Medications  Medication Dose Route Frequency Provider Last Rate Last Admin   botulinum toxin Type A  (BOTOX ) injection  100 Units  100 Units Intramuscular Once Onita Duos, MD        PAST MEDICAL HISTORY: Past Medical History:  Diagnosis Date   Chronic kidney disease    GERD (gastroesophageal reflux disease)    TAKES PRILOSEC   Headache(784.0)    SINCE NECK  THING   High cholesterol    TAKES LIPITOR   Hypertension    PONV (postoperative nausea and vomiting)    NOTHING IN THE LAST COUPLE YRS   Rotator cuff tear, right    JUST FOUND OUT THIS WEEK    PAST SURGICAL HISTORY: Past Surgical History:  Procedure Laterality Date   ABDOMINAL HYSTERECTOMY     ANTERIOR CERVICAL DECOMP/DISCECTOMY FUSION  10/01/2011   Procedure: ANTERIOR CERVICAL DECOMPRESSION/DISCECTOMY FUSION 2 LEVELS;  Surgeon: Oneil JAYSON Herald;  Location: [facility];  Service: Orthopedics;  Laterality: N/A;  C5-6, C6-7 Anterior Cervical Discectomy and Fusion, Allograft and Plate   BLEPHAROPLASTY     2001   BREAST BIOPSY Left 11/20/2019   BREAST EXCISIONAL BIOPSY Right    BREAST SURGERY     10 YRS  BENIGN   FRACTURE SURGERY     LEFT  WRIST   LEFT HEART CATH AND CORONARY ANGIOGRAPHY N/A 07/15/2020   Procedure: LEFT HEART CATH AND CORONARY ANGIOGRAPHY;  Surgeon: Elmira Newman PARAS, MD;  [facility];  Service: Cardiovascular;  Laterality: N/A;   LEFT WRIST     METAL PLATE    ROBOTIC ASSISTED LAPAROSCOPIC LYSIS OF ADHESION N/A 01/16/2024   Procedure: LYSIS, ADHESIONS, ROBOT-ASSISTED, LAPAROSCOPIC;  Surgeon: Danielle Rom, MD;  Location: [facility] OR;  Service: Gynecology;  Laterality: N/A;   ROBOTIC ASSISTED SALPINGO OOPHERECTOMY Right 01/16/2024   Procedure: ROBOTIC ASSISTED SALPINGO OOPHORECTOMY;  Surgeon: Danielle Rom, MD;  Location: [facility] OR;  Service: Gynecology;  Laterality: Right;  and CPT S2900   SHOULDER SURGERY Right    arthroscopy and rotator cuff repair   TUBAL LIGATION      FAMILY HISTORY: Family History  Problem Relation Age of Onset   Heart attack Mother    Leukemia Father    Diabetes Maternal Grandmother     Breast cancer Cousin     SOCIAL HISTORY: Social History   Socioeconomic History   Marital status: Married    Spouse name: Not on file   Number of children: 1   Years of education: Not on file   Highest education level: Not on file  Occupational History   Not on file  Tobacco Use   Smoking status: Never   Smokeless tobacco: Never  Vaping Use   Vaping status: Never Used  Substance and Sexual Activity   Alcohol use: No   Drug use: No   Sexual activity: Not on file  Other Topics Concern   Not on file  Social History Narrative   LIves at home with husband ,  Daniel.  One child DanniLaii Georgina.    Social Drivers of Health   Financial Resource Strain: Low Risk  (10/28/2020)   Received from [facility] visits prior to 12/11/2022.   Overall Financial Resource Strain (CARDIA)    Difficulty of Paying Living Expenses: Not hard at all  Food Insecurity: Low Risk  (06/06/2024)   Received from [facility]   Hunger Vital Sign    Within the past 12 months, you worried that your food would run out before you got money to buy more: Never true    Within the past 12 months, the food you bought just didn't last and you didn't have money to get more. : Never true  Transportation Needs: No Transportation Needs (06/06/2024)   Received from [facility]    In the past 12 months, has lack of reliable transportation kept you from medical appointments, meetings, work or from getting things needed for daily living? : No  Physical Activity: Sufficiently Active (10/28/2020)   Received from [facility] visits prior to 12/11/2022.   Exercise Vital Sign    On average, how many days per week do you engage in moderate to strenuous exercise (like a brisk walk)?: 6 days    On average, how many minutes do you engage in exercise at this level?: 60 min  Stress: No Stress Concern Present (10/28/2020)   Received from [facility]  visits prior to 12/11/2022.   [employer] of Occupational Health - Occupational Stress Questionnaire    Feeling of Stress : Only a little  Social Connections: Socially Integrated (10/28/2020)   Received from [facility] visits prior to 12/11/2022.   Social Connection and Isolation Panel    In a typical week, how many times do you talk on the phone with family, friends, or neighbors?: Twice a week    How often do you get together with friends or relatives?: Once a week    How often do you attend church or religious services?: More than 4 times per year    Do you belong to any clubs or organizations such as church groups, unions, fraternal or athletic groups, or school groups?: Yes    How often do you attend meetings of the clubs or organizations you belong to?: More than 4 times per year    Are you married, widowed, divorced, separated, never married, or living with a partner?: Married  Intimate Partner Violence: Not At Risk (10/28/2020)   Received from [facility] visits prior to 12/11/2022.   Humiliation, Afraid, Rape, and Kick questionnaire    Within the last year, have you been afraid of your partner or ex-partner?: No    Within the last year, have you been humiliated or emotionally abused in other ways by your partner or ex-partner?: No    Within the last year, have you been kicked, hit, slapped, or otherwise physically hurt by your partner or ex-partner?: No    Within the last year, have you been raped or forced to have any kind of sexual activity by your partner or ex-partner?: No      Modena Callander, M.D. Ph.D.  [facility] [address] [place], [place] [address] Ph: [phone] Fax: [phone]  CC:  Macarthur Elouise SQUIBB, DO [address],  [place] [address]  Lazoff, Shawn P, DO

## 2024-08-21 NOTE — Progress Notes (Signed)
 Botox - 100 units x 1vials Lot: [id number] Expiration: 2027/11 NDC: 9976-8854-98  Bacteriostatic 0.9% Sodium Chloride - 2 mL  Lot: [id number] Expiration: 08/10/25 NDC: 9590803397  Dx: J38.3 B/B DRAWN BY DR. CARLIE

## 2024-11-01 ENCOUNTER — Other Ambulatory Visit: Payer: Self-pay | Admitting: Cardiology

## 2024-11-08 ENCOUNTER — Telehealth: Payer: Self-pay | Admitting: Cardiology

## 2024-11-08 MED ORDER — METOPROLOL SUCCINATE ER 25 MG PO TB24: 25.0000 mg | ORAL_TABLET | Freq: Every morning | ORAL | 0 refills | Status: AC

## 2024-11-08 NOTE — Telephone Encounter (Signed)
 Refills sent

## 2024-11-08 NOTE — Telephone Encounter (Signed)
" °*  STAT* If patient is at the pharmacy, call can be transferred to refill team.   1. Which medications need to be refilled? (please list name of each medication and dose if known) metoprolol  succinate (TOPROL -XL) 25 MG 24 hr tablet   2. Which pharmacy/location (including street and city if local pharmacy) is medication to be sent to? [facility] - [place], [place] - [address]   3. Do they need a 30 day or 90 day supply? 90   Patient has appt on 2/2 "

## 2024-11-12 ENCOUNTER — Ambulatory Visit: Admitting: Nurse Practitioner

## 2024-11-12 NOTE — Progress Notes (Unsigned)
 "  Office Visit    Patient Name: Jeanne Schwartz Date of Encounter: 11/12/2024  Primary Care Provider:  Lazoff, Shawn P, DO Primary Cardiologist:  Madonna Large, DO  Chief Complaint    79 year old female with a history of hypertension, hyperlipidemia, and GERD who presents for follow-up related to hypertension.   Past Medical History    Past Medical History:  Diagnosis Date   Chronic kidney disease    GERD (gastroesophageal reflux disease)    TAKES PRILOSEC   Headache(784.0)    SINCE NECK  THING   High cholesterol    TAKES LIPITOR   Hypertension    PONV (postoperative nausea and vomiting)    NOTHING IN THE LAST COUPLE YRS   Rotator cuff tear, right    JUST FOUND OUT THIS WEEK   Past Surgical History:  Procedure Laterality Date   ABDOMINAL HYSTERECTOMY     ANTERIOR CERVICAL DECOMP/DISCECTOMY FUSION  10/01/2011   Procedure: ANTERIOR CERVICAL DECOMPRESSION/DISCECTOMY FUSION 2 LEVELS;  Surgeon: Oneil JAYSON Herald;  Location: [facility] OR;  Service: Orthopedics;  Laterality: N/A;  C5-6, C6-7 Anterior Cervical Discectomy and Fusion, Allograft and Plate   BLEPHAROPLASTY     2001   BREAST BIOPSY Left 11/20/2019   BREAST EXCISIONAL BIOPSY Right    BREAST SURGERY     10 YRS  BENIGN   FRACTURE SURGERY     LEFT  WRIST   LEFT HEART CATH AND CORONARY ANGIOGRAPHY N/A 07/15/2020   Procedure: LEFT HEART CATH AND CORONARY ANGIOGRAPHY;  Surgeon: Elmira Newman JINNY, MD;  Location: [facility];  Service: Cardiovascular;  Laterality: N/A;   LEFT WRIST     METAL PLATE    ROBOTIC ASSISTED LAPAROSCOPIC LYSIS OF ADHESION N/A 01/16/2024   Procedure: LYSIS, ADHESIONS, ROBOT-ASSISTED, LAPAROSCOPIC;  Surgeon: Danielle Rom, MD;  Location: [facility] OR;  Service: Gynecology;  Laterality: N/A;   ROBOTIC ASSISTED SALPINGO OOPHERECTOMY Right 01/16/2024   Procedure: ROBOTIC ASSISTED SALPINGO OOPHORECTOMY;  Surgeon: Danielle Rom, MD;  Location: [facility] OR;  Service: Gynecology;  Laterality: Right;  and CPT  S2900   SHOULDER SURGERY Right    arthroscopy and rotator cuff repair   TUBAL LIGATION      Allergies  Allergies[1]   Labs/Other Studies Reviewed    The following studies were reviewed today:  Cardiac Studies & Procedures   ______________________________________________________________________________________________ CARDIAC CATHETERIZATION  CARDIAC CATHETERIZATION 07/15/2020  Conclusion Images from the original result were not included. Angiographically normal epicardial coronary arteries with no coronary artery disease. Normal LVEDP.   Newman JINNY [place], MD Pager: [phone] Office: [phone]  Findings Coronary Findings Diagnostic  Dominance: Right  Left Main Vessel is normal in caliber. Vessel is angiographically normal.  Left Anterior Descending Vessel is normal in caliber. Vessel is angiographically normal.  Left Circumflex Vessel is normal in caliber. Vessel is angiographically normal.  Right Coronary Artery Vessel is normal in caliber. Vessel is angiographically normal.  Intervention  No interventions have been documented.   STRESS TESTS  PCV MYOCARDIAL PERFUSION WITH LEXISCAN 06/02/2020  Narrative Lexiscan (Walking with mod Bruce)Tetrofosmin Stress Test  06/02/2020: Non-diagnostic ECG stress. There is decreased counts suggestive of reversible moderate defect in the inferior and apical regions, the defect is probably contributed by very small resting and stress  LV volume (54mL) than true ischemia. Overall LV systolic function is normal without regional wall motion abnormalities. Stress LV EF: 72%. No previous exam available for comparison. Intermediate risk scan. Clinical correlation recommended.   ECHOCARDIOGRAM  PCV ECHOCARDIOGRAM COMPLETE 06/05/2020  Narrative Echocardiogram 06/05/2020: Left ventricle cavity is normal in size. Mild concentric hypertrophy of the left ventricle. Normal global wall motion. Normal LV systolic function  with EF 55%. Normal diastolic filling pattern. Calculated EF 55%. Mild tricuspid regurgitation. Estimated pulmonary artery systolic pressure 24 mmHg.          ______________________________________________________________________________________________     Recent Labs: 01/16/2024: BUN 17; Creatinine, Ser 1.28; Hemoglobin 15.1; Platelets 221; Potassium 4.5; Sodium 141  Recent Lipid Panel No results found for: CHOL, TRIG, HDL, CHOLHDL, VLDL, LDLCALC, LDLDIRECT  History of Present Illness                                                                                                                                                                                                79 year old female with the above past medical history including hypertension, hyperlipidemia, and GERD.  She was initially referred to cardiology in the setting of chest pain, shortness of breath.  Echocardiogram in 05/2020 showed normal LV function, EF 55%, no RWMA, mild concentric LVH, mild tricuspid valve regurgitation.  Cardiac catheterization in 07/2020 showed no evidence of CAD.  She was last seen in the office on 09/06/2023 and was stable from a cardiac standpoint.  She presents today for follow-up.  Since her last visit  Hypertension: Hyperlipidemia: Disposition:  Home Medications    Current Outpatient Medications  Medication Sig Dispense Refill   albuterol (VENTOLIN HFA) 108 (90 Base) MCG/ACT inhaler Inhale 2 puffs into the lungs every 6 (six) hours as needed (Coughing).     aspirin  EC 81 MG tablet Take 81 mg by mouth daily. Swallow whole.     atorvastatin (LIPITOR) 20 MG tablet Take 20 mg by mouth at bedtime.      Calcium Carb-Cholecalciferol (CALCIUM 600-D PO) Take 500 mg by mouth daily.     cholecalciferol (VITAMIN D3) 25 MCG (1000 UNIT) tablet Take 1,000 Units by mouth daily.     CRANBERRY PO Take 500 mg by mouth 2 (two) times daily.     Cyanocobalamin (B-12 PO) Take 1,000 mcg by mouth  daily.     cycloSPORINE (RESTASIS) 0.05 % ophthalmic emulsion Place 1 drop into both eyes daily as needed (dry eyes).      Fiber Gummies 2 g CHEW Chew 4 g by mouth daily.     Flaxseed, Linseed, (FLAX SEEDS PO) Take 30 mLs by mouth daily. 900 mg Omega 6 3590 Omega 3     ibuprofen (ADVIL) 800 MG tablet 800 mg 3 (three) times daily. For after surgery     lisinopril (ZESTRIL) 20 MG tablet Take 20 mg by  mouth daily.     metoprolol  succinate (TOPROL -XL) 25 MG 24 hr tablet Take 1 tablet (25 mg total) by mouth every morning. 90 tablet 0   Multiple Vitamin (MULTIVITAMIN) capsule Take 1 capsule by mouth daily.      omeprazole (PRILOSEC) 20 MG capsule Take 20 mg by mouth daily.     vitamin C (ASCORBIC ACID) 250 MG tablet Take 250 mg by mouth daily.     Current Facility-Administered Medications  Medication Dose Route Frequency Provider Last Rate Last Admin   botulinum toxin Type A  (BOTOX ) injection 100 Units  100 Units Intramuscular Once Onita Duos, MD         Review of Systems    ***.  All other systems reviewed and are otherwise negative except as noted above.    Physical Exam    VS:  There were no vitals taken for this visit. , BMI There is no height or weight on file to calculate BMI.     GEN: Well nourished, well developed, in no acute distress. HEENT: normal. Neck: Supple, no JVD, carotid bruits, or masses. Cardiac: RRR, no murmurs, rubs, or gallops. No clubbing, cyanosis, edema.  Radials/DP/PT 2+ and equal bilaterally.  Respiratory:  Respirations regular and unlabored, clear to auscultation bilaterally. GI: Soft, nontender, nondistended, BS + x 4. MS: no deformity or atrophy. Skin: warm and dry, no rash. Neuro:  Strength and sensation are intact. Psych: Normal affect.  Accessory Clinical Findings    ECG personally reviewed by me today -    - no acute changes.   Lab Results  Component Value Date   WBC 6.4 01/16/2024   HGB 15.1 (H) 01/16/2024   HCT 50.4 (H) 01/16/2024   MCV  104.1 (H) 01/16/2024   PLT 221 01/16/2024   Lab Results  Component Value Date   CREATININE 1.28 (H) 01/16/2024   BUN 17 01/16/2024   NA 141 01/16/2024   K 4.5 01/16/2024   CL 107 01/16/2024   CO2 21 (L) 01/16/2024   Lab Results  Component Value Date   ALT 21 09/14/2012   AST 25 09/14/2012   ALKPHOS 50 09/14/2012   BILITOT 0.6 09/14/2012   No results found for: CHOL, HDL, LDLCALC, LDLDIRECT, TRIG, CHOLHDL  No results found for: HGBA1C  Assessment & Plan    1.  ***  No BP recorded.  {Refresh Note OR Click here to enter BP  :1}***   Damien JAYSON Braver, NP 11/12/2024, 10:49 AM       [1]  Allergies Allergen Reactions   Meclizine Anaphylaxis   Penicillins Shortness Of Breath    Last 10 15 years   Prochlorperazine Palpitations and Shortness Of Breath    Other Reaction(s): Unknown   Prochlorperazine Edisylate Shortness Of Breath   Sulfonamide Derivatives     Other Reaction(s): Unknown   "

## 2024-11-16 ENCOUNTER — Telehealth: Payer: Self-pay | Admitting: Nurse Practitioner

## 2024-11-16 NOTE — Telephone Encounter (Signed)
 Pt upset her 2/2 appt is marked as No Show. Please advise.
